# Patient Record
Sex: Female | Born: 1980 | Race: White | Hispanic: No | Marital: Single | Smoking: Former smoker
Health system: Southern US, Community
[De-identification: ages and names within clinical notes are randomized; demographics above are authoritative.]

## PROBLEM LIST (undated history)

## (undated) DIAGNOSIS — R51 Headache: Secondary | ICD-10-CM

## (undated) DIAGNOSIS — F419 Anxiety disorder, unspecified: Secondary | ICD-10-CM

## (undated) DIAGNOSIS — D696 Thrombocytopenia, unspecified: Secondary | ICD-10-CM

## (undated) DIAGNOSIS — R519 Headache, unspecified: Secondary | ICD-10-CM

## (undated) DIAGNOSIS — Z9189 Other specified personal risk factors, not elsewhere classified: Secondary | ICD-10-CM

## (undated) DIAGNOSIS — D689 Coagulation defect, unspecified: Secondary | ICD-10-CM

## (undated) DIAGNOSIS — N809 Endometriosis, unspecified: Secondary | ICD-10-CM

## (undated) DIAGNOSIS — F32A Depression, unspecified: Secondary | ICD-10-CM

## (undated) DIAGNOSIS — G709 Myoneural disorder, unspecified: Secondary | ICD-10-CM

## (undated) DIAGNOSIS — T7840XA Allergy, unspecified, initial encounter: Secondary | ICD-10-CM

## (undated) DIAGNOSIS — F4310 Post-traumatic stress disorder, unspecified: Secondary | ICD-10-CM

## (undated) DIAGNOSIS — E559 Vitamin D deficiency, unspecified: Secondary | ICD-10-CM

## (undated) DIAGNOSIS — N83209 Unspecified ovarian cyst, unspecified side: Secondary | ICD-10-CM

## (undated) DIAGNOSIS — D649 Anemia, unspecified: Secondary | ICD-10-CM

## (undated) DIAGNOSIS — Z1371 Encounter for nonprocreative screening for genetic disease carrier status: Secondary | ICD-10-CM

## (undated) DIAGNOSIS — Z803 Family history of malignant neoplasm of breast: Principal | ICD-10-CM

## (undated) DIAGNOSIS — M1990 Unspecified osteoarthritis, unspecified site: Secondary | ICD-10-CM

## (undated) HISTORY — DX: Encounter for nonprocreative screening for genetic disease carrier status: Z13.71

## (undated) HISTORY — DX: Vitamin D deficiency, unspecified: E55.9

## (undated) HISTORY — DX: Family history of malignant neoplasm of breast: Z80.3

## (undated) HISTORY — DX: Other specified personal risk factors, not elsewhere classified: Z91.89

## (undated) HISTORY — DX: Myoneural disorder, unspecified: G70.9

## (undated) HISTORY — DX: Depression, unspecified: F32.A

## (undated) HISTORY — PX: ABDOMINAL HYSTERECTOMY: SHX81

## (undated) HISTORY — PX: TONSILLECTOMY: SUR1361

## (undated) HISTORY — DX: Coagulation defect, unspecified: D68.9

## (undated) HISTORY — DX: Unspecified osteoarthritis, unspecified site: M19.90

## (undated) HISTORY — DX: Endometriosis, unspecified: N80.9

## (undated) HISTORY — PX: TUBAL LIGATION: SHX77

## (undated) HISTORY — PX: MOUTH SURGERY: SHX715

## (undated) HISTORY — DX: Allergy, unspecified, initial encounter: T78.40XA

---

## 2004-12-11 ENCOUNTER — Emergency Department: Payer: Self-pay | Admitting: Emergency Medicine

## 2005-05-08 ENCOUNTER — Ambulatory Visit: Payer: Self-pay | Admitting: Family Medicine

## 2005-05-08 IMAGING — US ULTRASOUND LEFT BREAST
1 series · 16 of 16 positions shown · non-contrast
Comparison: none

REASON FOR EXAM: lt outer breast lump
COMMENTS:

[Series 1: ultrasound left breast · 16 of 16 slices shown]
[im 1/16]
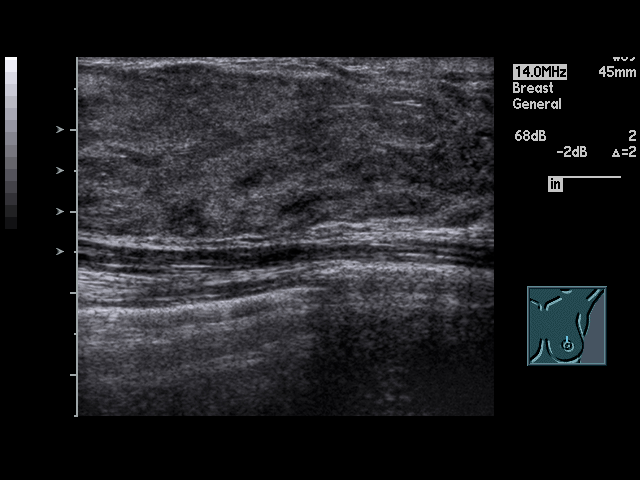
[im 2/16]
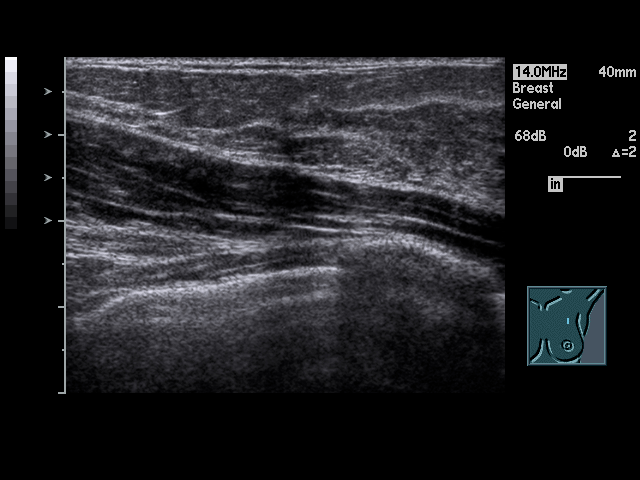
[im 3/16]
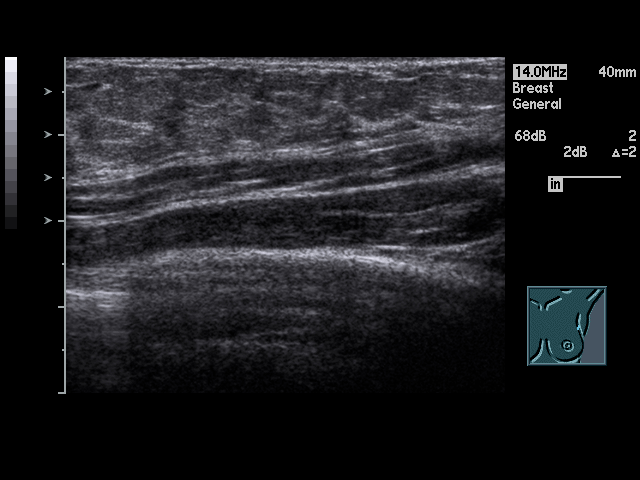
[im 4/16]
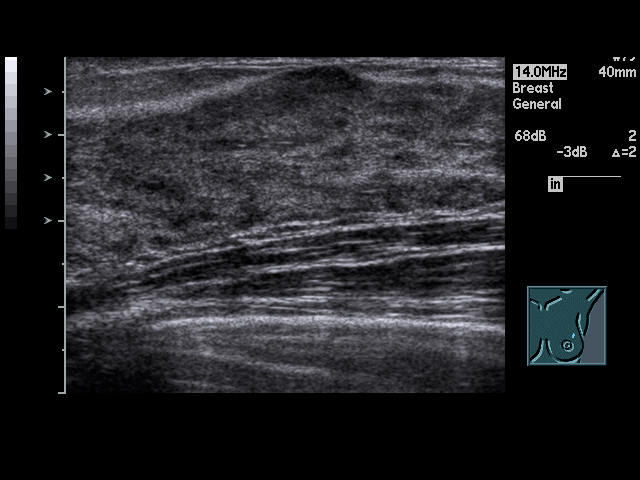
[im 5/16]
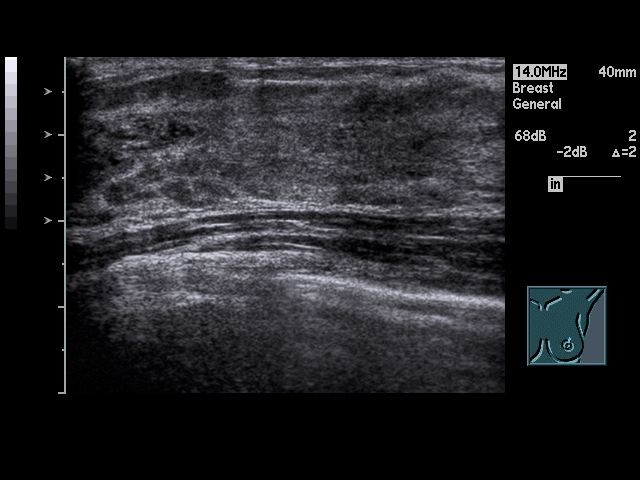
[im 6/16]
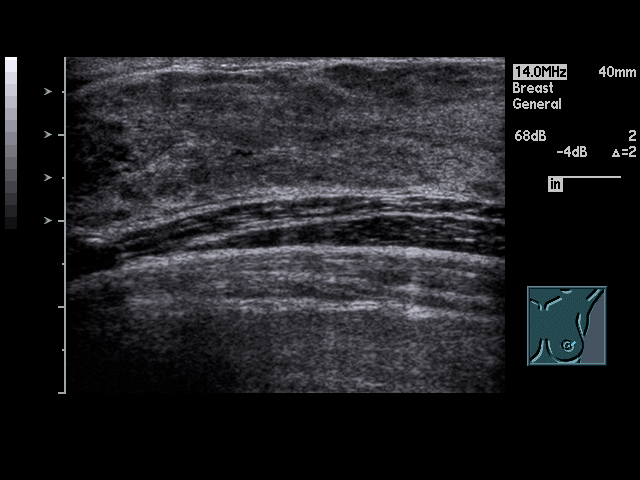
[im 7/16]
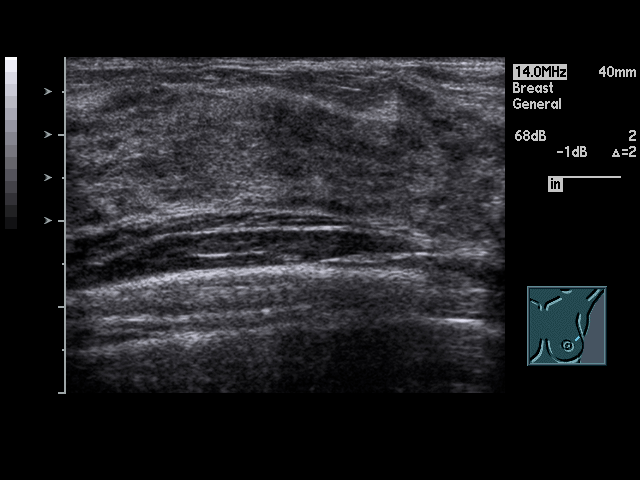
[im 8/16]
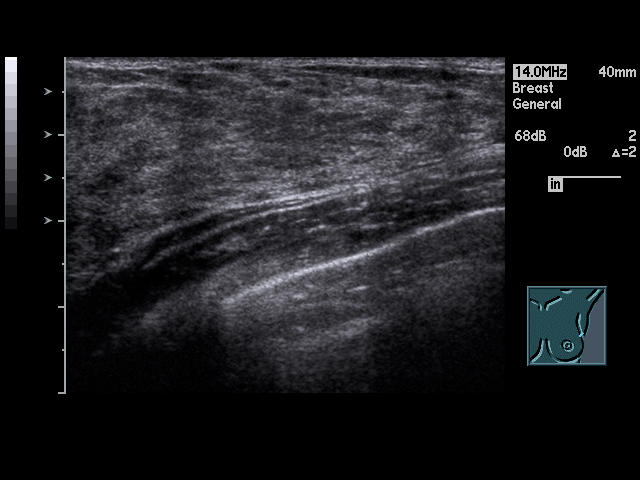
[im 9/16]
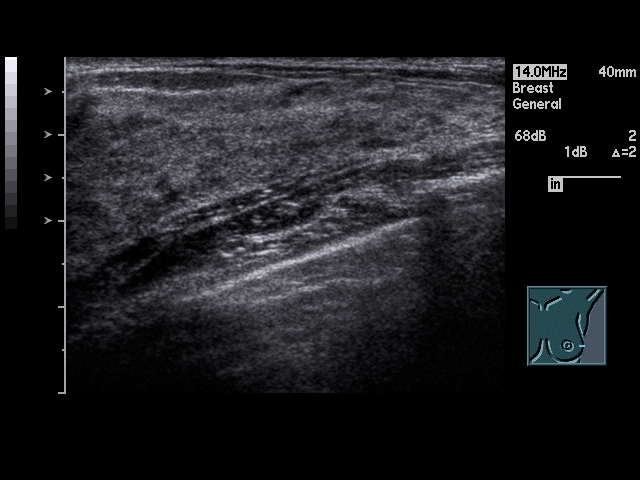
[im 10/16]
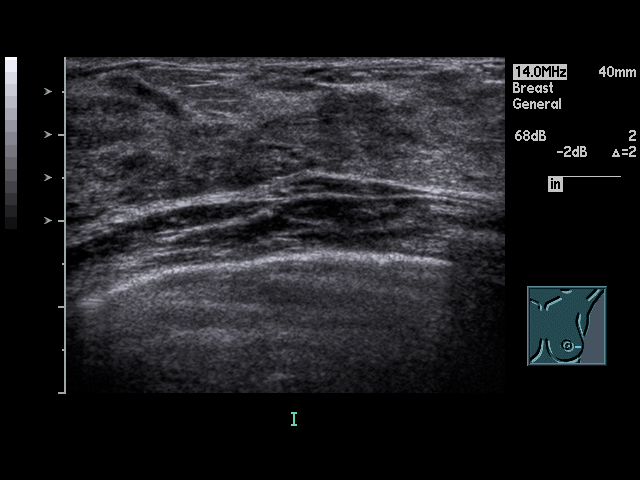
[im 11/16]
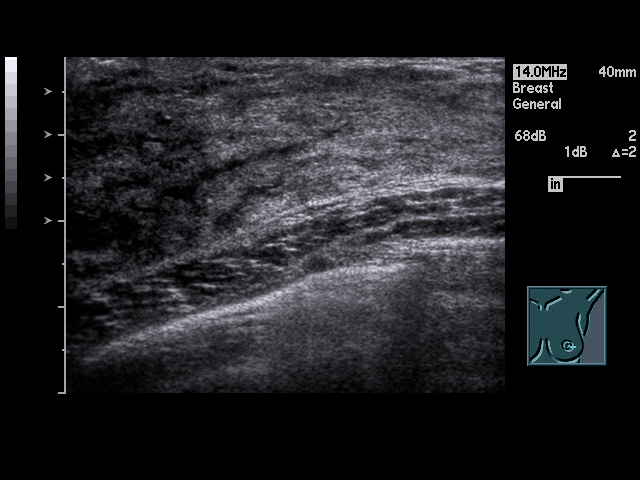
[im 12/16]
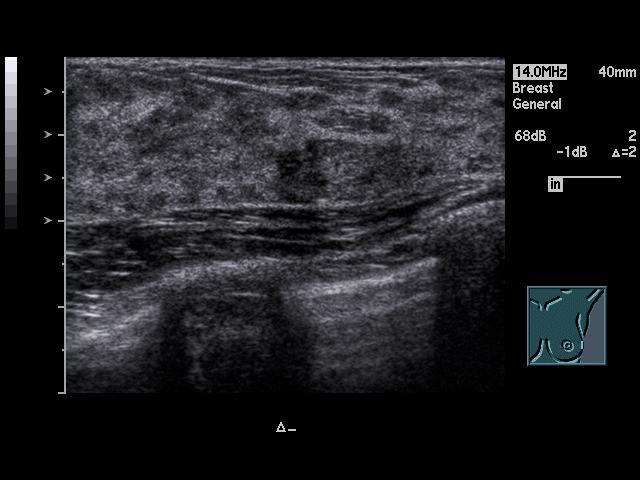
[im 13/16]
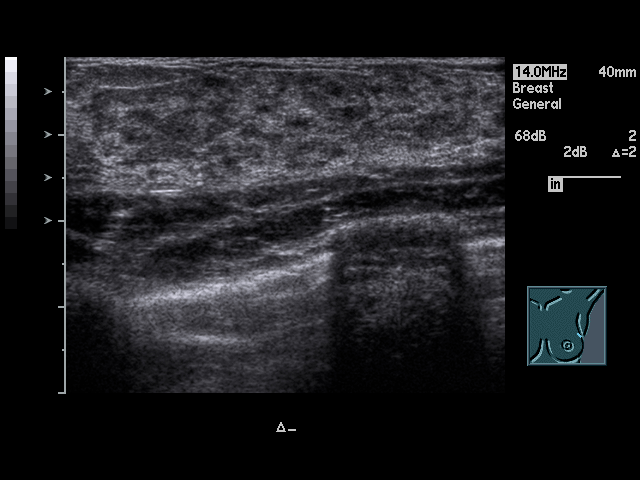
[im 14/16]
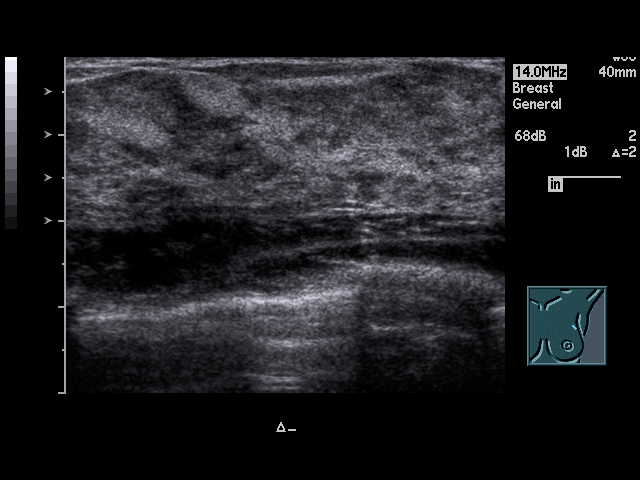
[im 15/16]
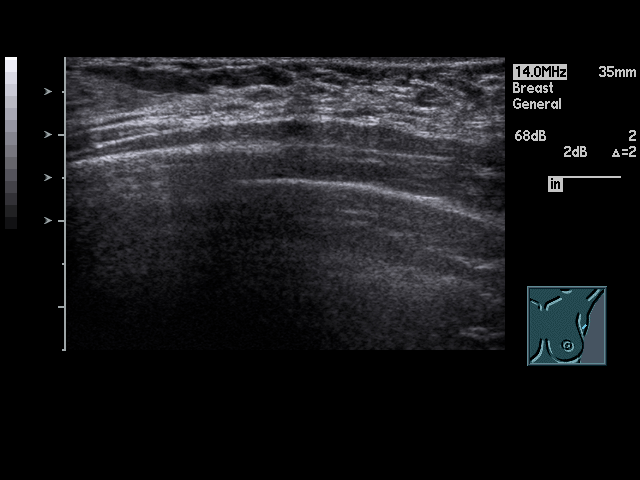
[im 16/16]
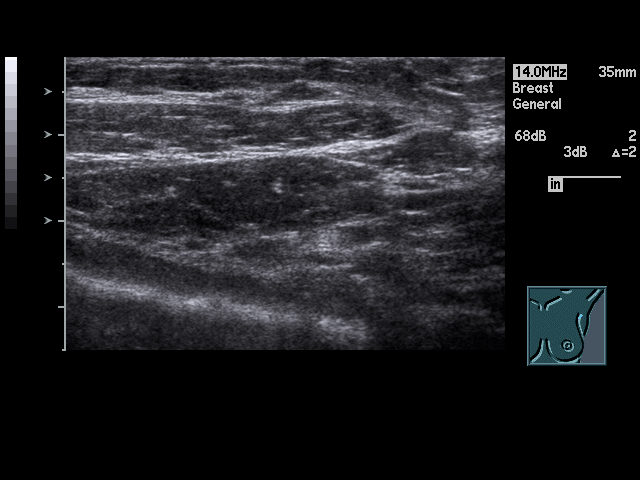

[16 of 16 positions shown; findings below may reference images not displayed]

PROCEDURE:     US  - US BREAST LEFT  - [DATE]  [DATE]

RESULT:       The patient has a palpable area of concern in the upper outer
quadrant of the LEFT breast.  Sonographic examination targeted to this area
shows no specific sonographic abnormalities.  No solid or cystic mass is
seen.  There is observed dense glandular tissue as is seen in the remainder
of the breast.  The etiology for the palpable area is not evident
sonographically.  Note is made that a mammogram has also been requested in
this patient.   Since the patient is at the end of the menstrual cycle, this
exam will be scheduled in approximately two weeks.  If the palpable area of
concern regresses, mammography may not be needed and the patient has been
instructed to check with her doctor's office if regression does occur.
IMPRESSION: No significant sonographic abnormalities are identified.

## 2006-03-01 ENCOUNTER — Emergency Department: Payer: Self-pay | Admitting: Emergency Medicine

## 2006-08-28 ENCOUNTER — Ambulatory Visit: Payer: Self-pay

## 2006-08-28 IMAGING — US US PELV - US TRANSVAGINAL
1 series · 17 of 25 positions shown · non-contrast
Comparison: none

REASON FOR EXAM: Pain on left side
               CALL REPORT: [PHONE_NUMBER]
COMMENTS:

[Series 1: us pelv - us transvaginal · 17 of 80 slices shown]
[im 1/80]
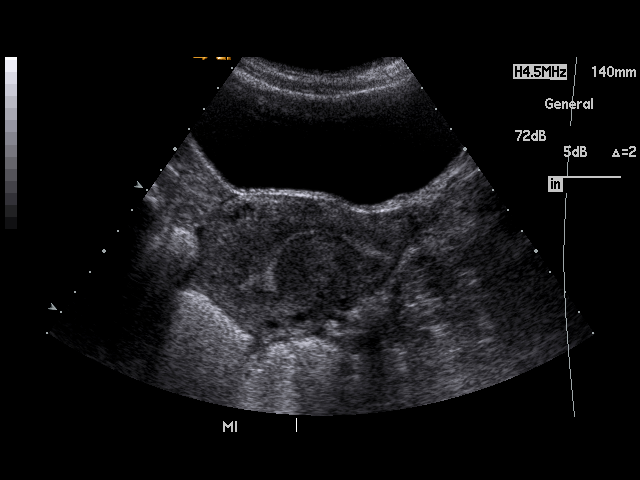
[im 7/80]
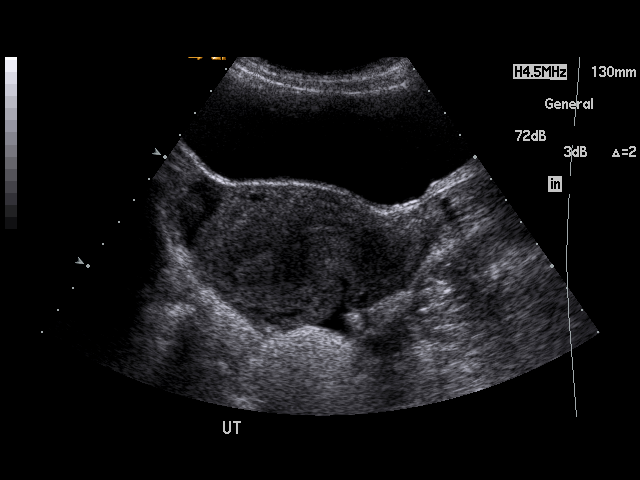
[im 10/80]
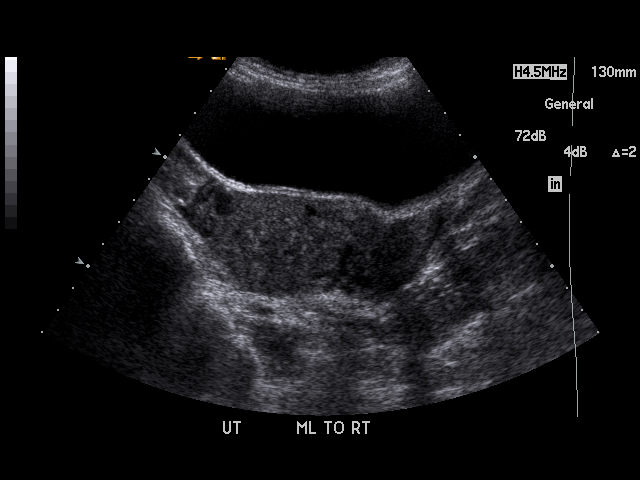
[im 17/80]
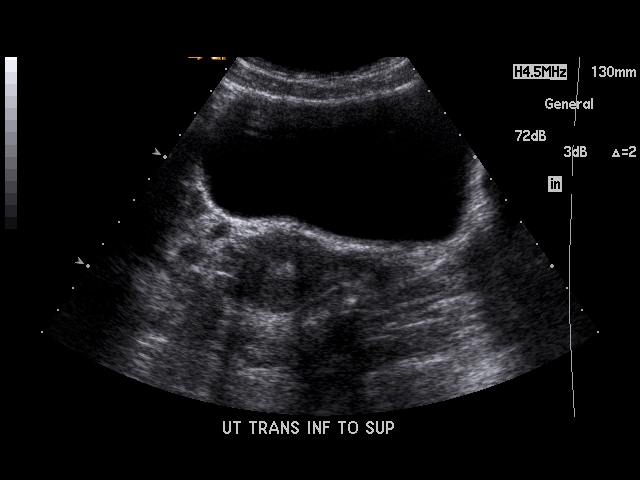
[im 20/80]
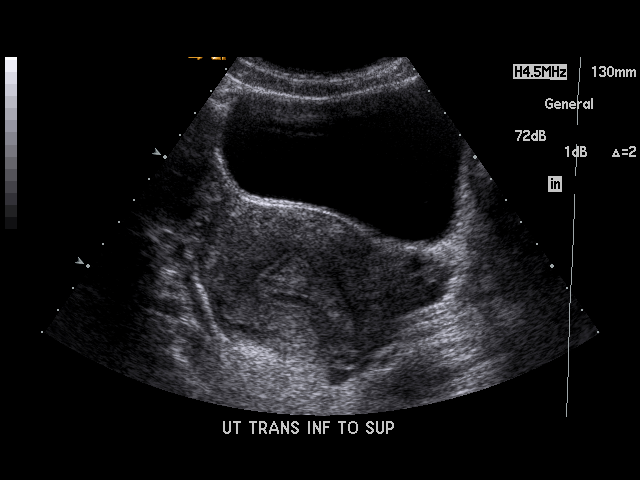
[im 27/80]
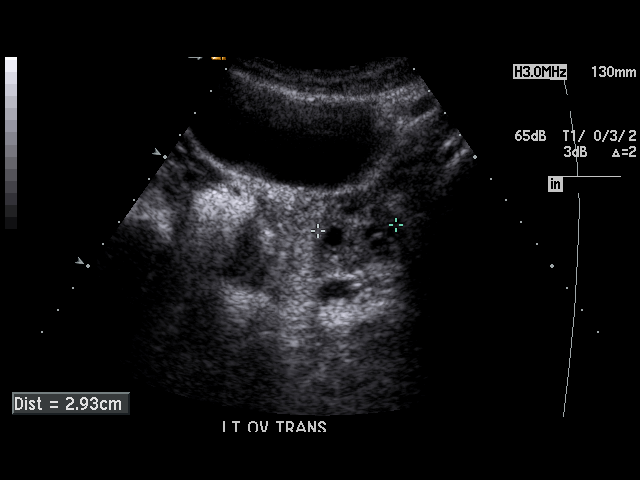
[im 30/80]
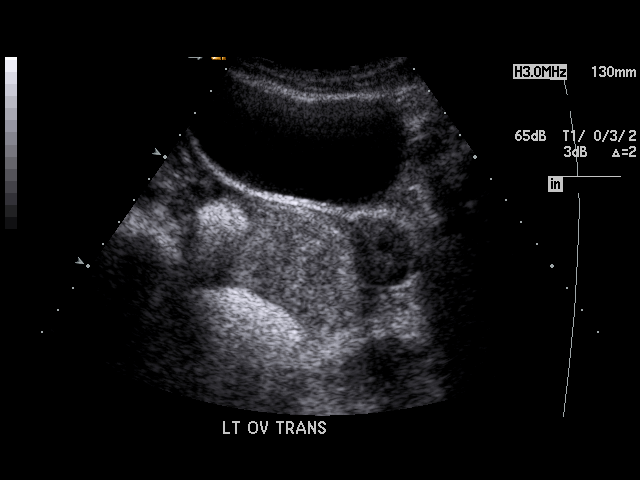
[im 37/80]
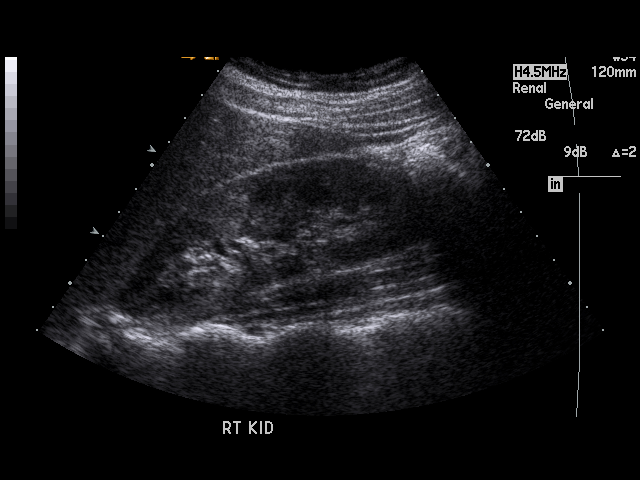
[im 40/80]
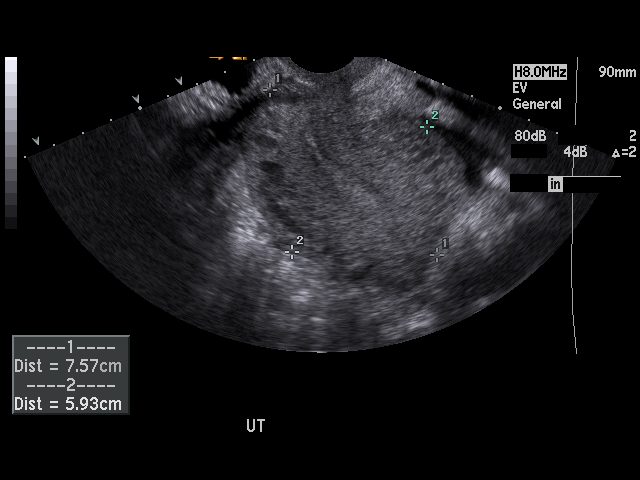
[im 43/80]
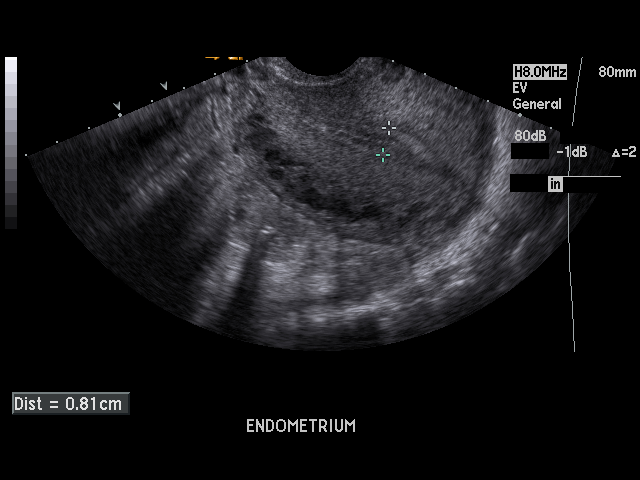
[im 50/80]
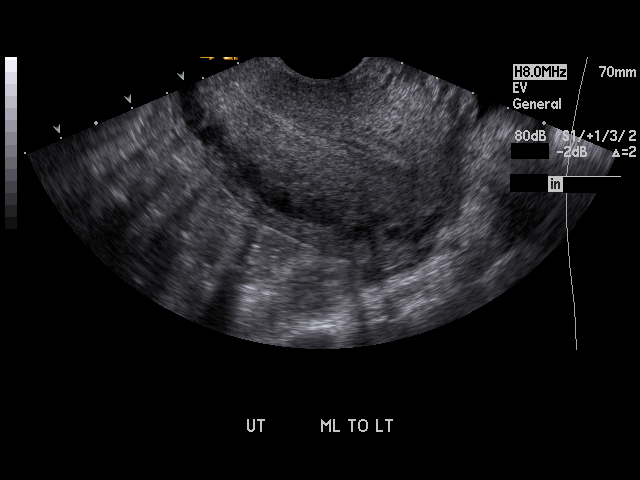
[im 53/80]
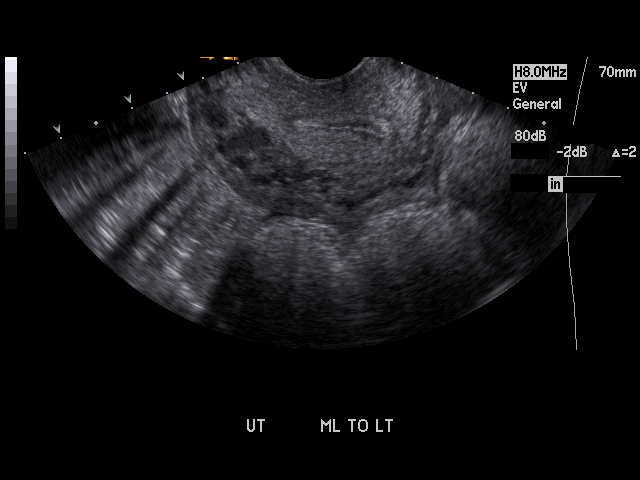
[im 60/80]
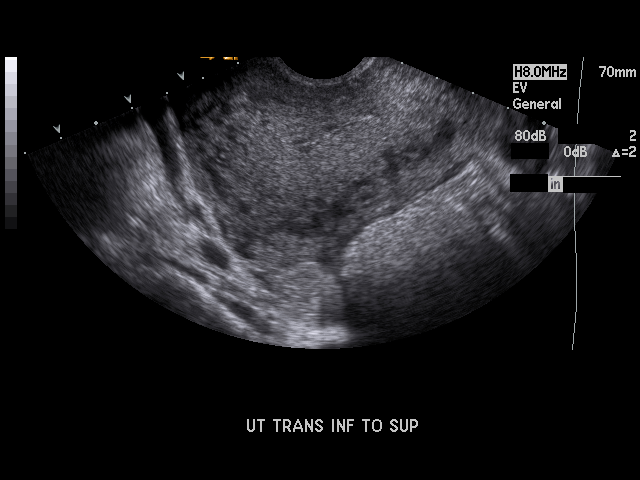
[im 63/80]
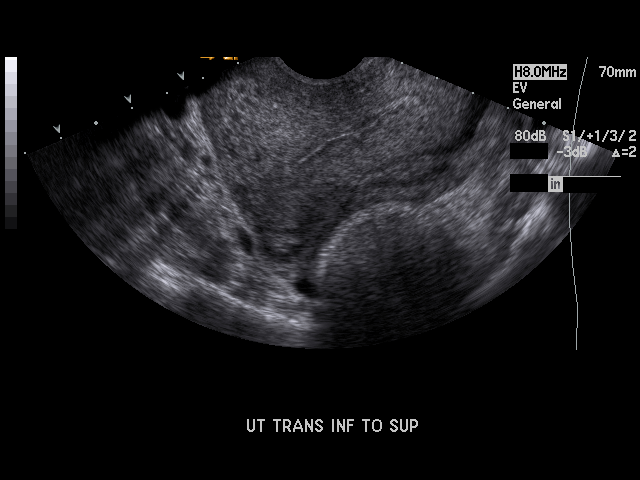
[im 70/80]
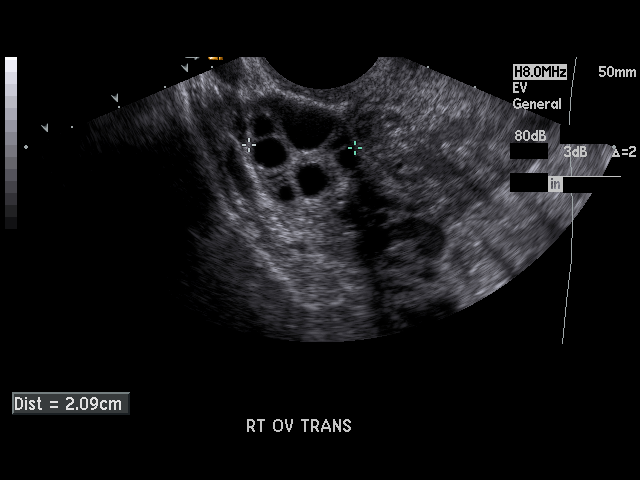
[im 73/80]
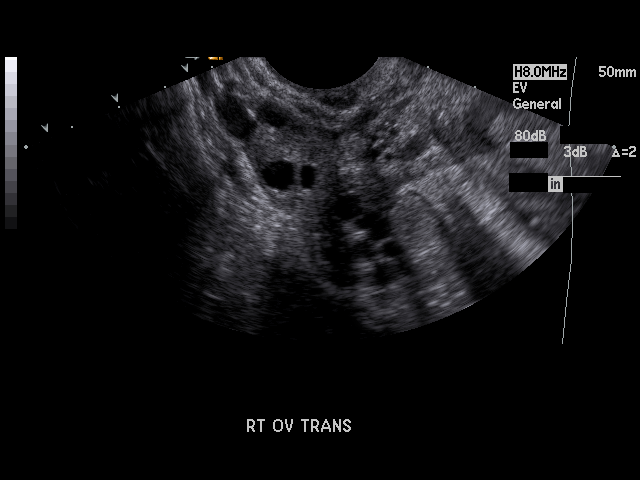
[im 80/80]
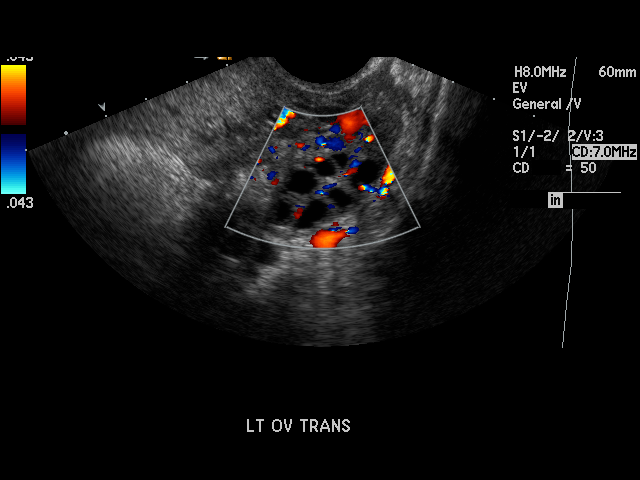

[17 of 25 positions shown; findings below may reference images not displayed]

PROCEDURE:     US  - US PELVIS MASS EXAM  - [DATE]  [DATE] [DATE]  [DATE]

RESULT:     Transabdominal and endovaginal ultrasound was performed. The
uterus is retroverted. The uterus measures 7.57 cm x 5.93 cm x 7.08 cm. The
endometrium measures 4.9 mm in thickness. There is a trace of fluid in the
endometrial cavity. The significance of this is uncertain. No uterine mass
lesions are identified. The RIGHT and LEFT ovaries are visualized. The RIGHT
ovary measures 3.9 cm at maximum diameter. The LEFT ovary measures 2.9 cm at
maximum diameter. There are noted multiple follicular cysts in each ovary.
No abnormal adnexal masses are seen. There is no free fluid in the pelvis.
The visualized portion of the urinary bladder shows no significant
abnormalities. The RIGHT and LEFT kidneys were visualized and show no
hydronephrosis.
IMPRESSION: 1.     No specific abnormalities are identified.
2.     The uterus is retroverted.
3.     There is a trace of fluid in the endometrial canal.
4.     No abnormal adnexal masses are identified.

## 2007-07-29 ENCOUNTER — Ambulatory Visit: Payer: Self-pay

## 2007-12-03 ENCOUNTER — Emergency Department: Payer: Self-pay | Admitting: Emergency Medicine

## 2007-12-03 IMAGING — CR DG CHEST 2V
1 series · 2 of 2 positions shown · non-contrast
Comparison: none

REASON FOR EXAM: rib pain
COMMENTS:

PROCEDURE:     DXR - DXR CHEST PA (OR AP) AND LATERAL  - [DATE] [DATE]
RESULT:     The lung fields are clear. The heart, mediastinal and osseous
structures show no significant abnormalities.

[Series 1: view not recorded · 0.17mm/px · 2 of 2 slices shown]
[im 1/2]
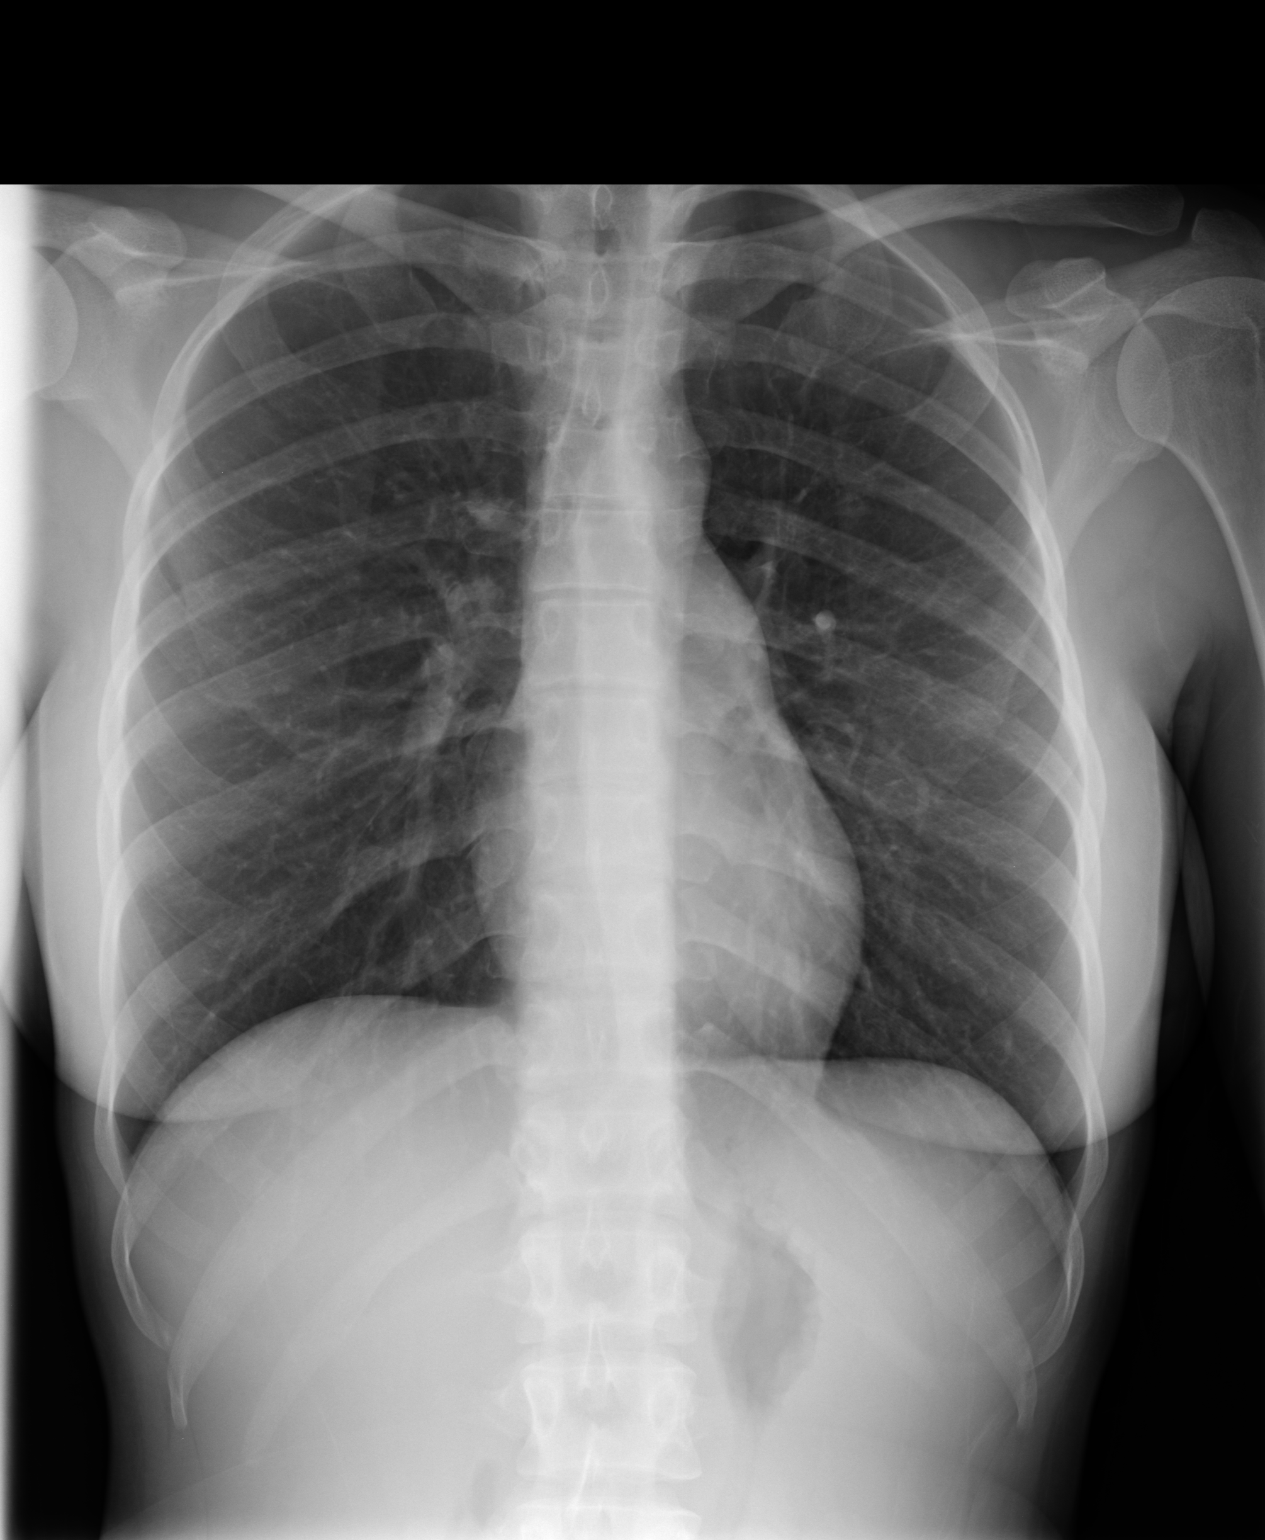
[im 2/2]
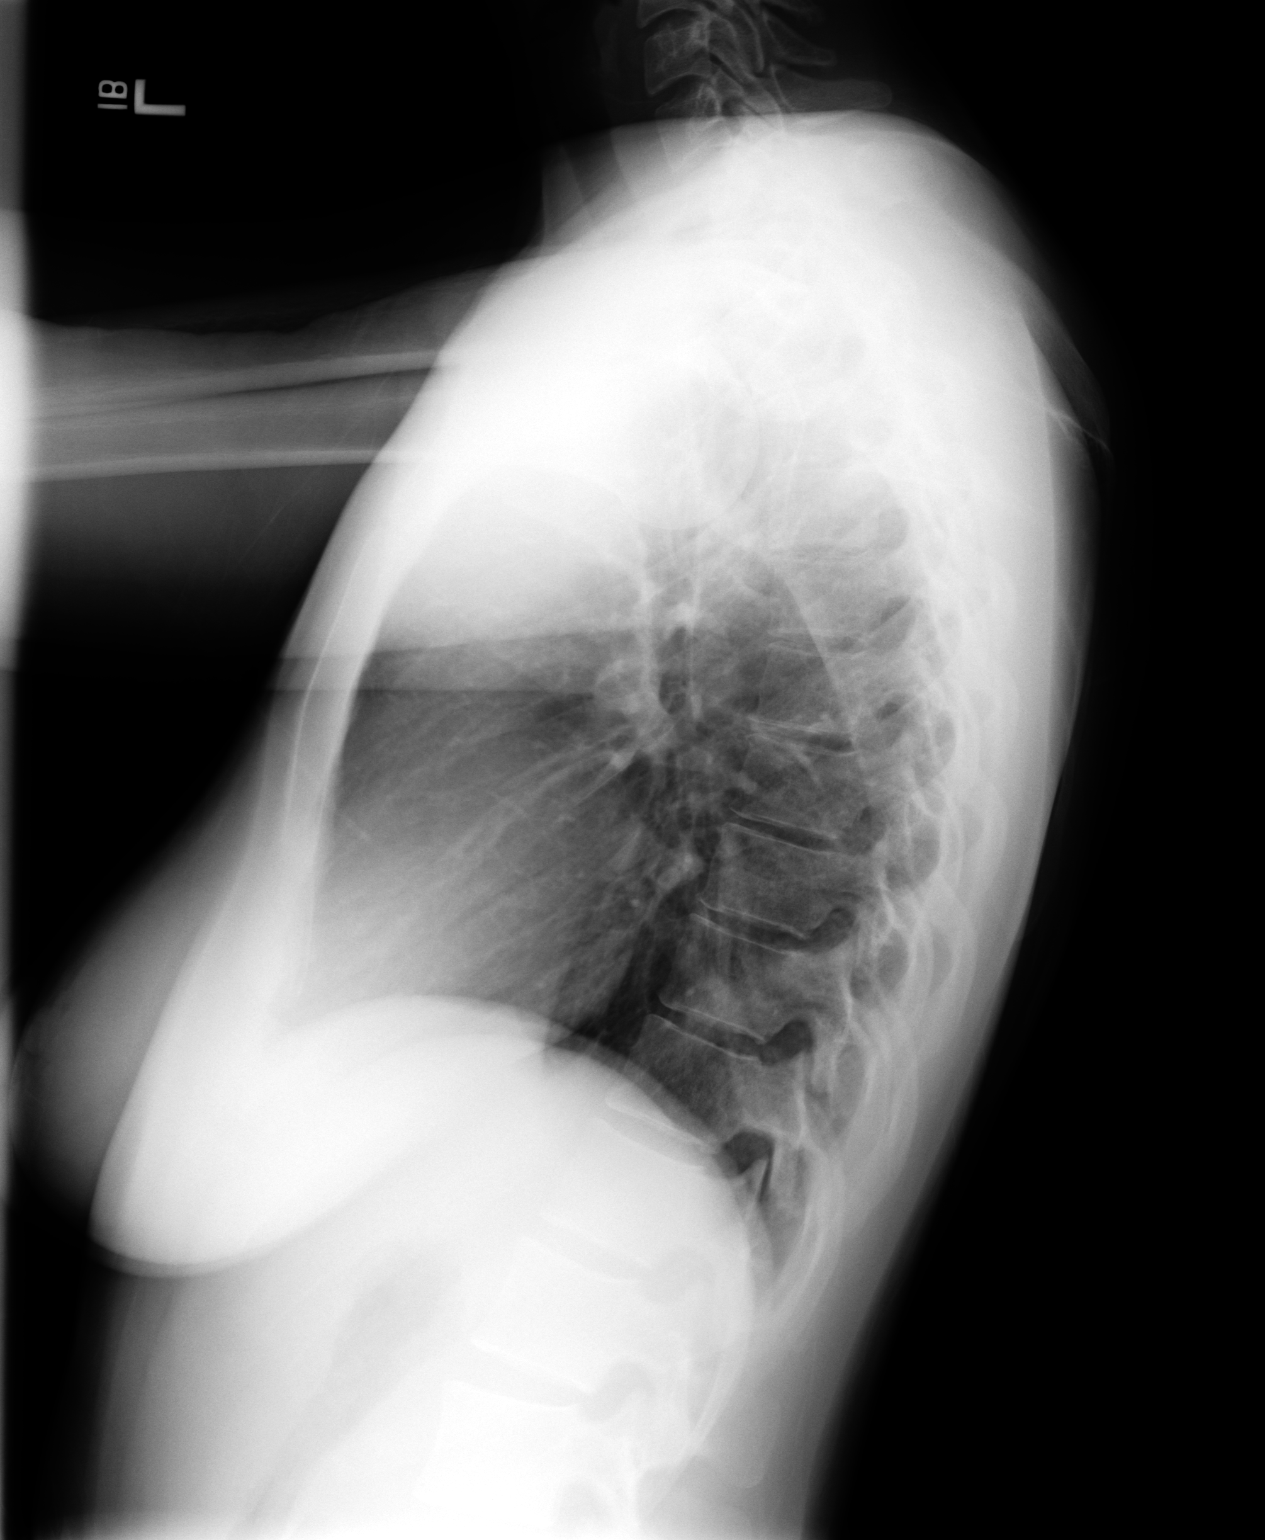

[2 of 2 positions shown; findings below may reference images not displayed]

IMPRESSION: 1.     No significant abnormalities are noted.

## 2008-06-01 ENCOUNTER — Emergency Department: Payer: Self-pay | Admitting: Emergency Medicine

## 2008-06-10 ENCOUNTER — Emergency Department: Payer: Self-pay | Admitting: Emergency Medicine

## 2009-01-20 ENCOUNTER — Emergency Department: Payer: Self-pay | Admitting: Emergency Medicine

## 2011-01-14 ENCOUNTER — Emergency Department: Payer: Self-pay | Admitting: Unknown Physician Specialty

## 2011-01-14 IMAGING — CR DG ELBOW COMPLETE 3+V*L*
1 series · 5 of 5 positions shown · non-contrast
Comparison: none

REASON FOR EXAM: s/p injury
COMMENTS:   May transport without cardiac monitor

PROCEDURE:     DXR - DXR ELBOW LT COMP W/OBLIQUES  - [DATE]  [DATE]
RESULT:     Images of the left elbow demonstrate minimal lucency in the
proximal radius concerning for nondisplaced radial head fracture. Minimal
hemarthrosis is suggested. The alignment is maintained.

[Series 1: view not recorded · 0.17mm/px · 5 of 5 slices shown]
[im 1/5]
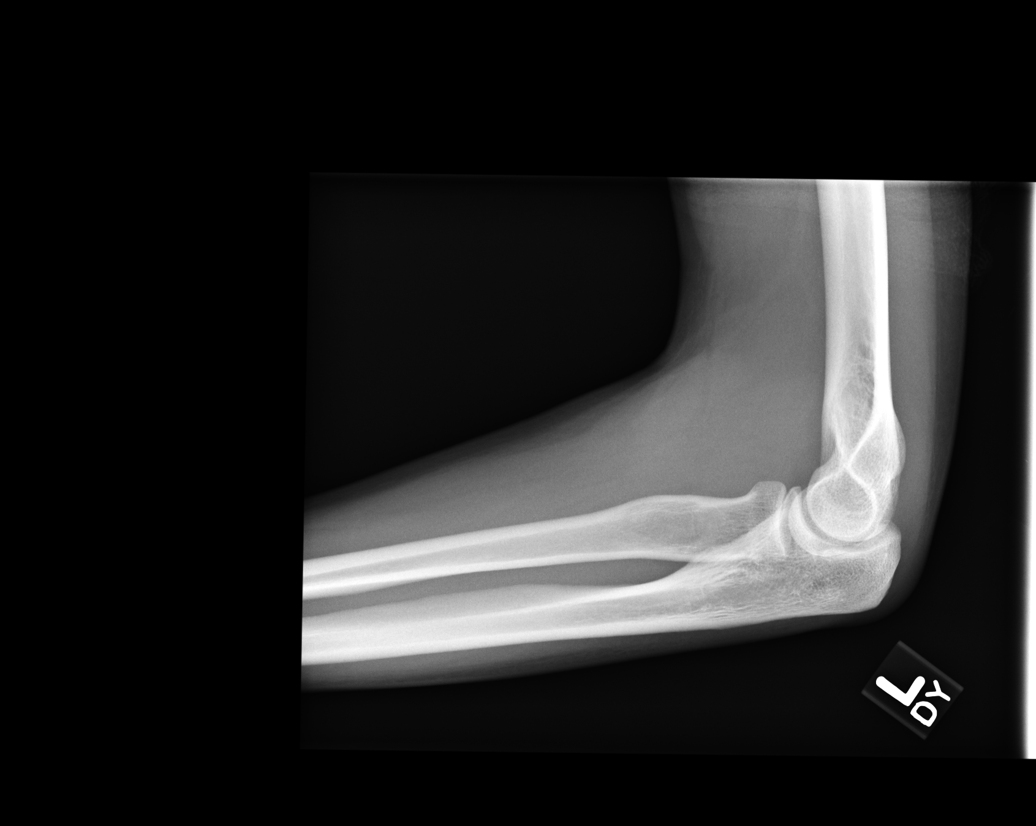
[im 2/5]
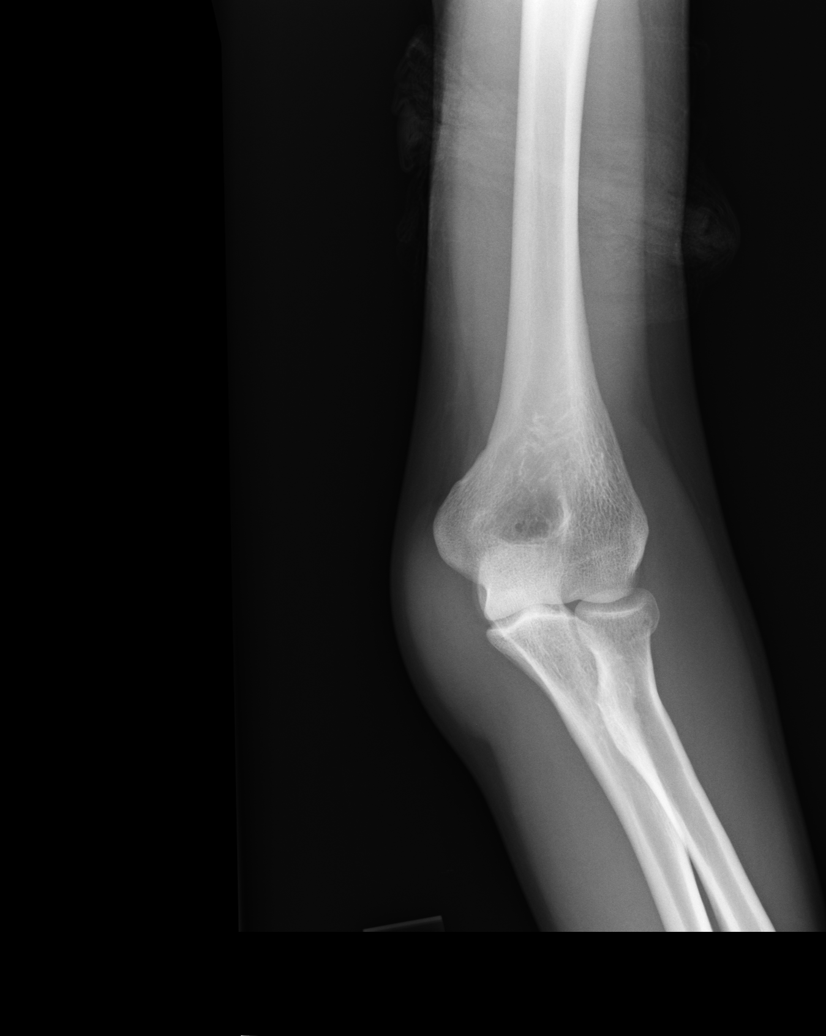
[im 3/5]
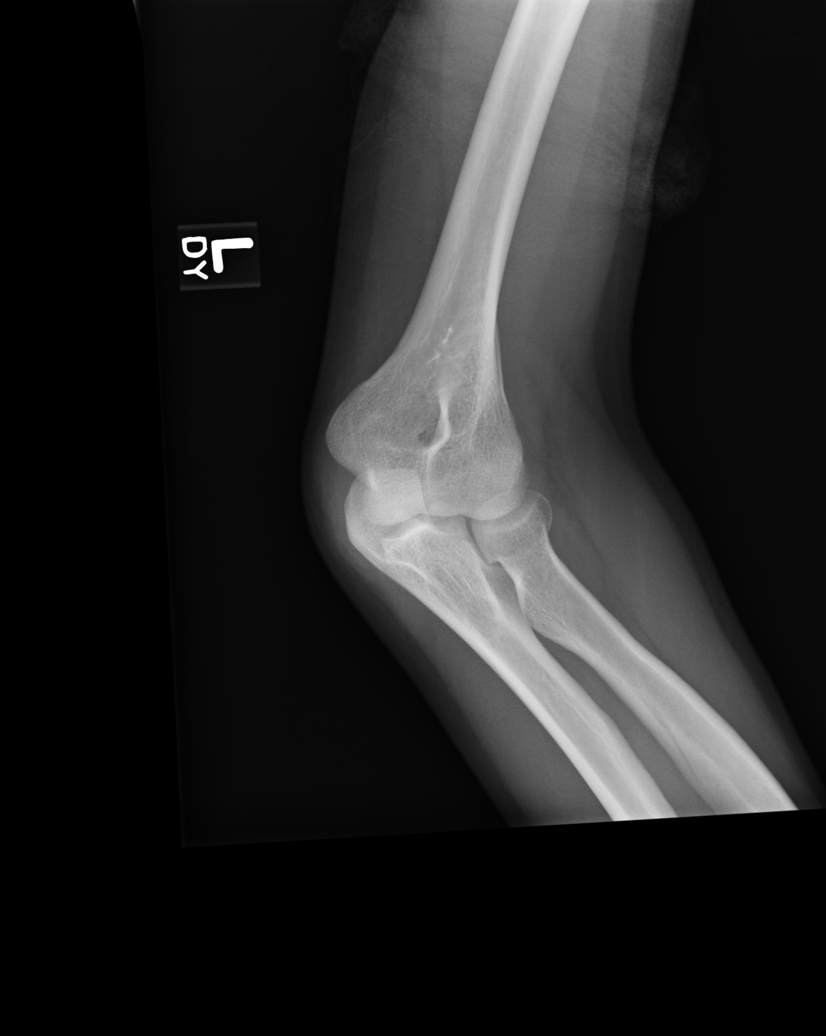
[im 4/5]
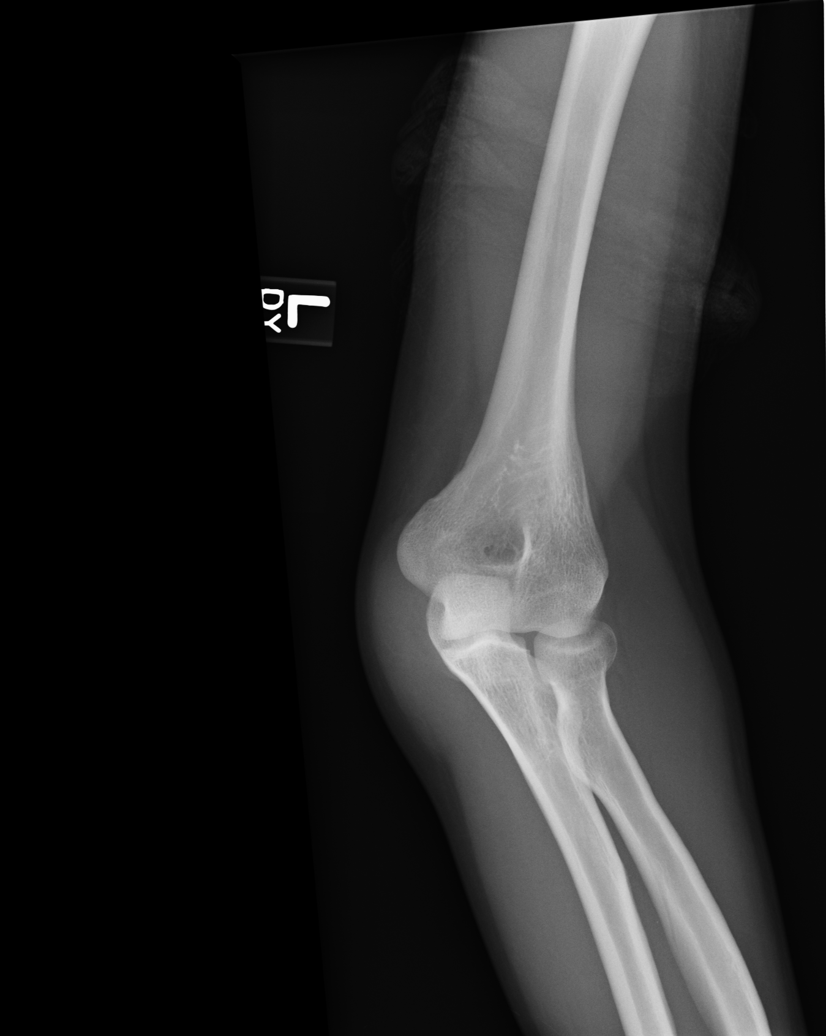
[im 5/5]
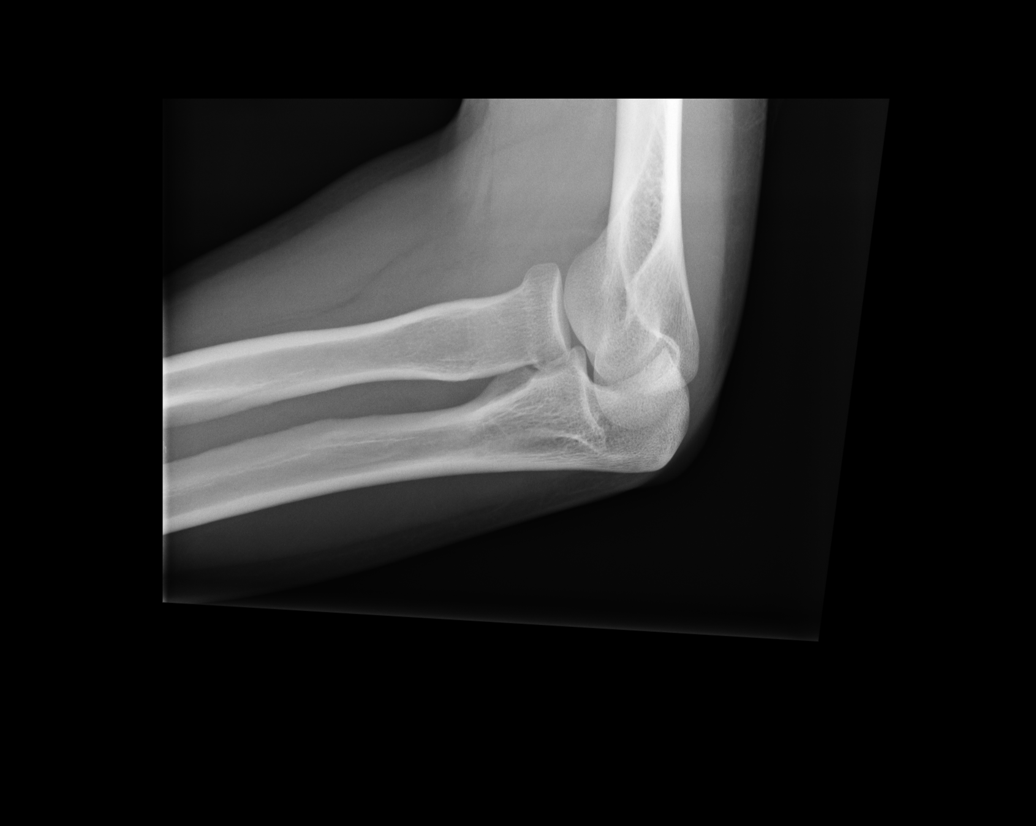

[5 of 5 positions shown; findings below may reference images not displayed]

IMPRESSION: Please see above.

## 2012-03-31 ENCOUNTER — Emergency Department: Payer: Self-pay | Admitting: Emergency Medicine

## 2012-03-31 LAB — COMPREHENSIVE METABOLIC PANEL
Albumin: 4.3 g/dL (ref 3.4–5.0)
BUN: 13 mg/dL (ref 7–18)
Bilirubin,Total: 0.7 mg/dL (ref 0.2–1.0)
Calcium, Total: 8.8 mg/dL (ref 8.5–10.1)
SGPT (ALT): 24 U/L
Total Protein: 7.5 g/dL (ref 6.4–8.2)

## 2012-03-31 LAB — URINALYSIS, COMPLETE
Glucose,UR: NEGATIVE mg/dL (ref 0–75)
Ketone: NEGATIVE
Leukocyte Esterase: NEGATIVE
Ph: 5 (ref 4.5–8.0)
Protein: NEGATIVE
RBC,UR: 1 /HPF (ref 0–5)
Specific Gravity: 1.023 (ref 1.003–1.030)
Squamous Epithelial: 2

## 2012-03-31 LAB — PREGNANCY, URINE: Pregnancy Test, Urine: NEGATIVE m[IU]/mL

## 2012-03-31 LAB — CBC
MCHC: 32.9 g/dL (ref 32.0–36.0)
MCV: 93 fL (ref 80–100)
RDW: 12.3 % (ref 11.5–14.5)
WBC: 5.5 10*3/uL (ref 3.6–11.0)

## 2012-03-31 IMAGING — US ABDOMEN ULTRASOUND LIMITED
1 series · 16 of 16 positions shown · non-contrast
Comparison: none

REASON FOR EXAM: R/o appendicitis
COMMENTS:   Body Site: Appendix/Bowel

PROCEDURE:     US  - US ABDOMEN LIMITED SURVEY  - [DATE] [DATE]
RESULT:     Right lower quadrant ultrasound exam obtained. Appendix is not
visualized. Small trace amount of free fluid is noted. Tiny lymph node is
noted in the right lower quadrant.

[Series 1: abdomen ultrasound limited · 16 of 16 slices shown]
[im 1/16]
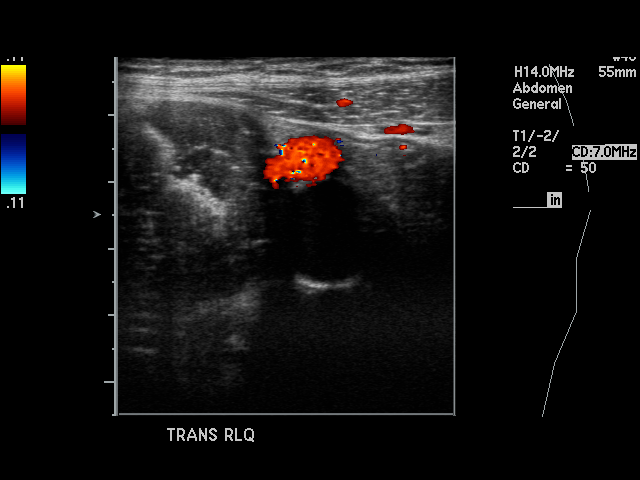
[im 2/16]
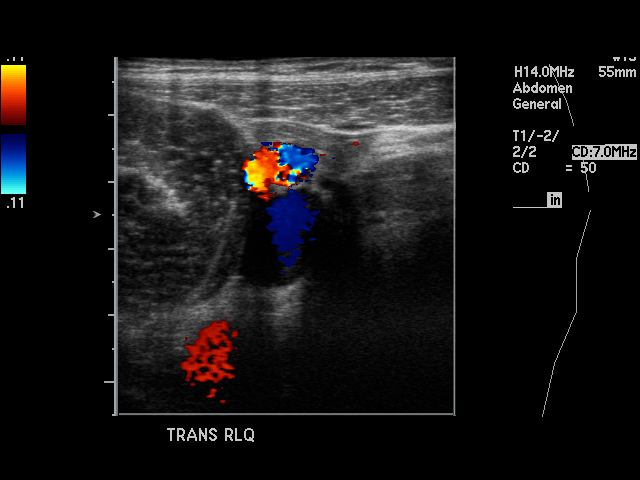
[im 3/16]
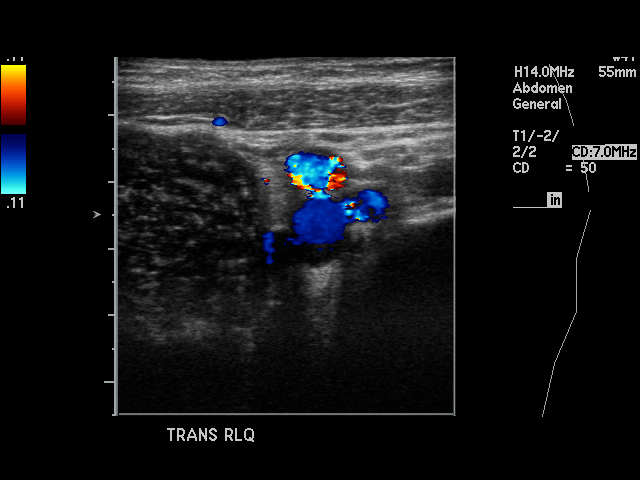
[im 4/16]
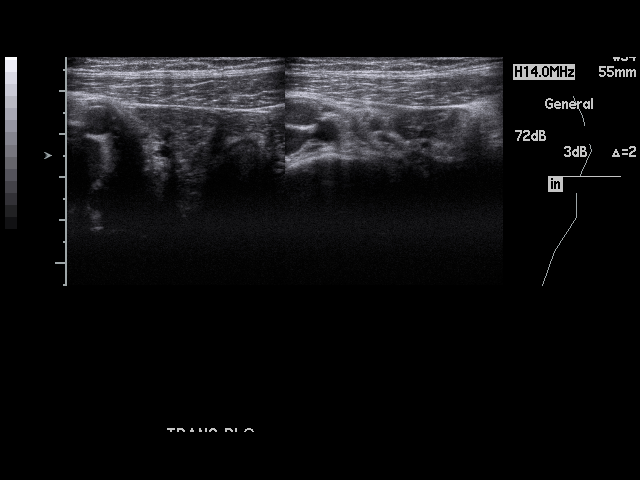
[im 5/16]
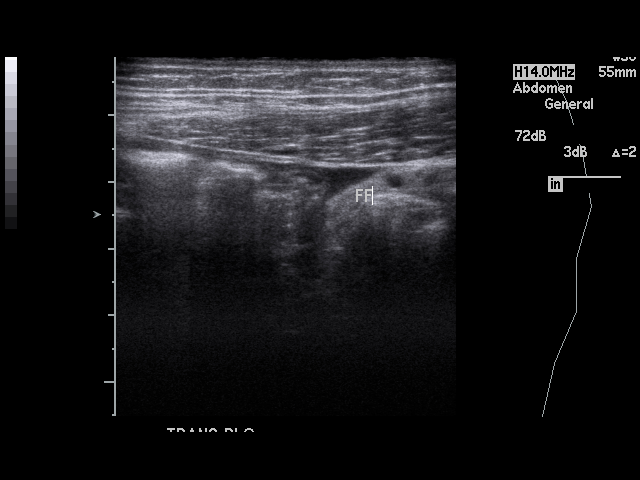
[im 6/16]
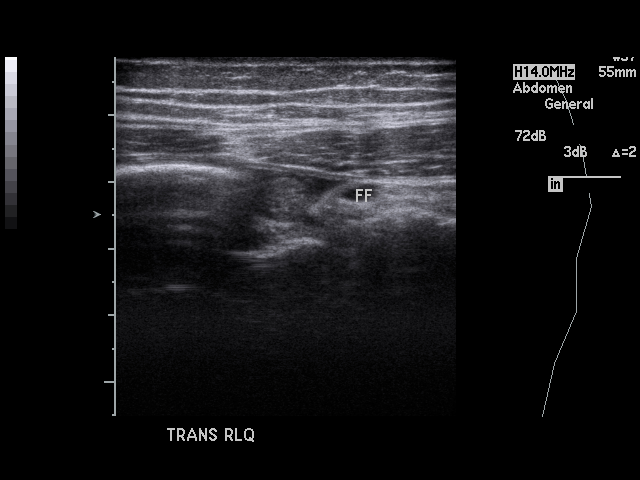
[im 7/16]
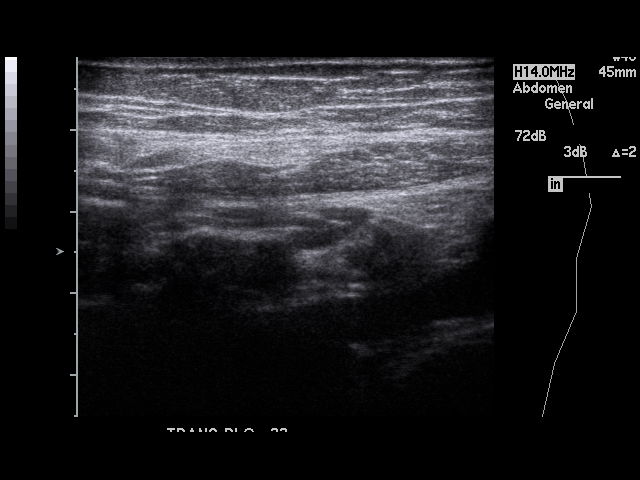
[im 8/16]
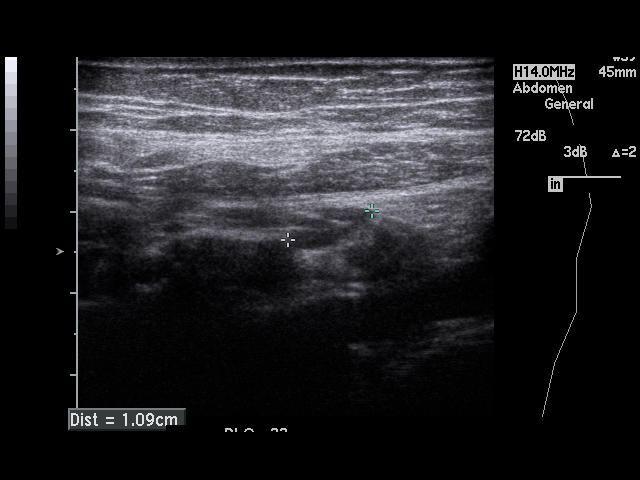
[im 9/16]
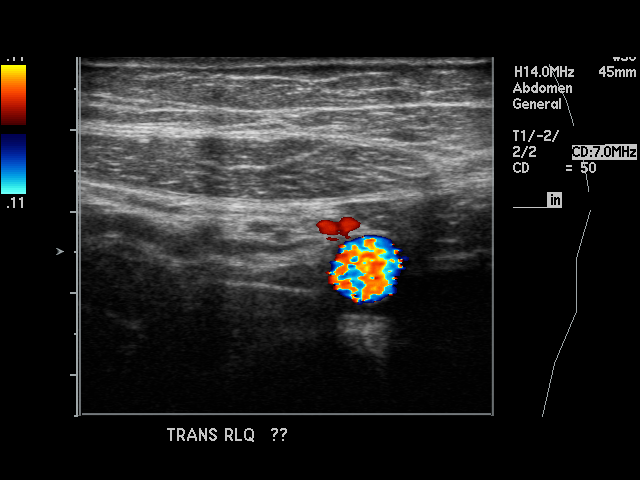
[im 10/16]
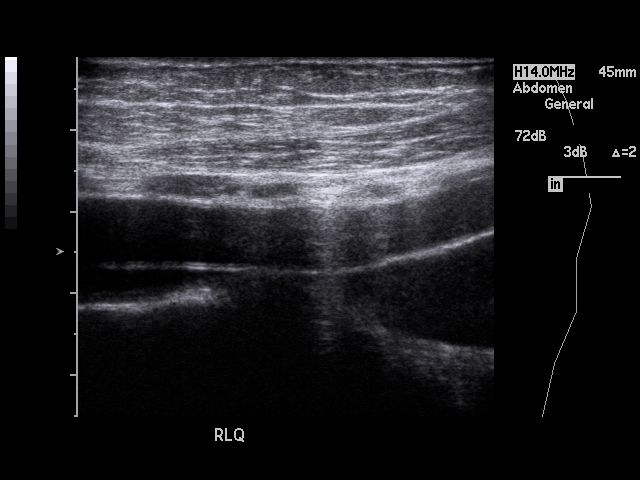
[im 11/16]
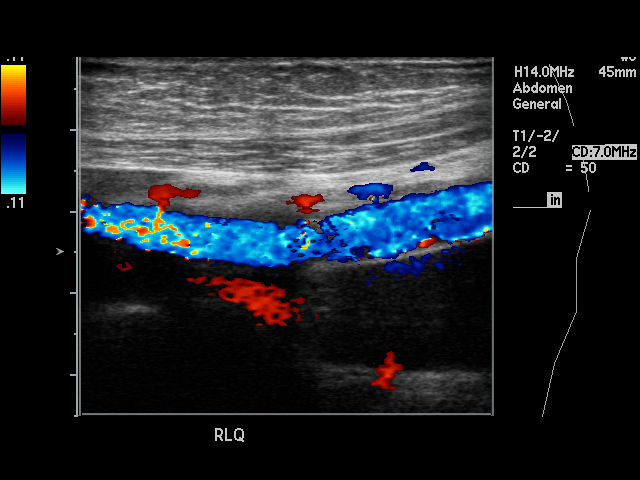
[im 12/16]
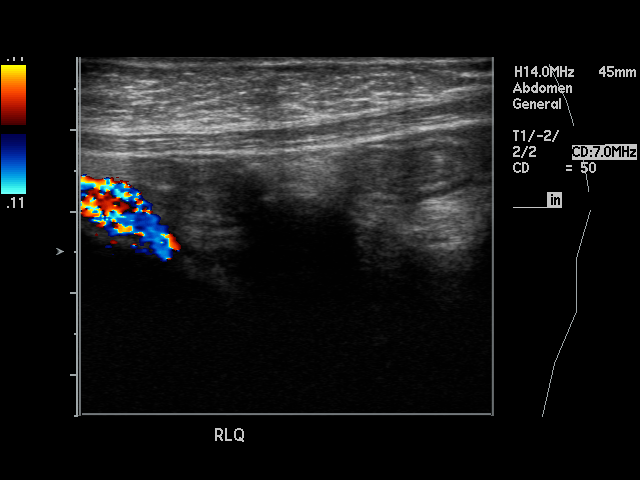
[im 13/16]
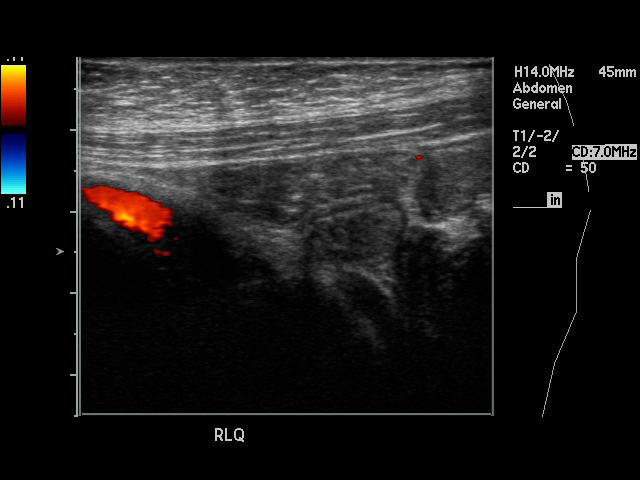
[im 14/16]
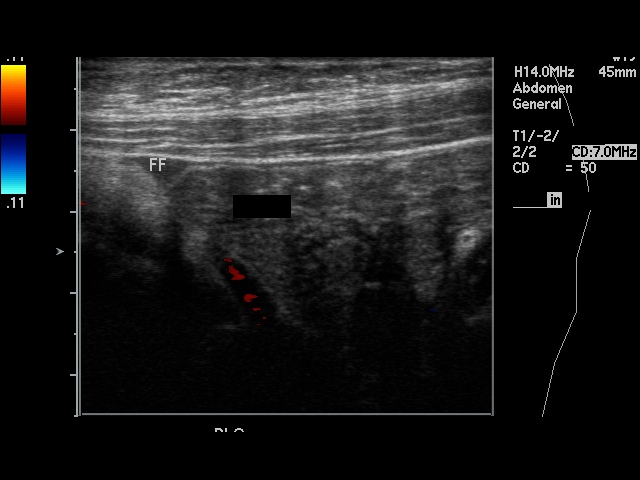
[im 15/16]
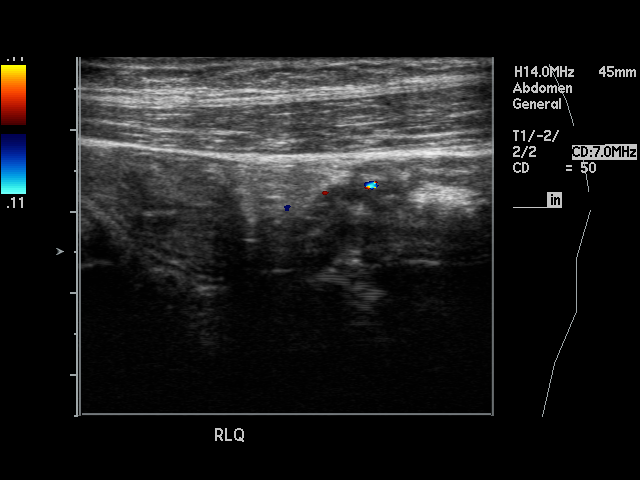
[im 16/16]
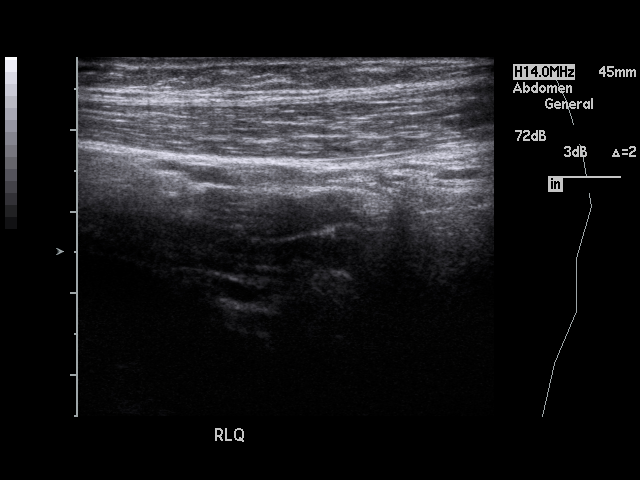

[16 of 16 positions shown; findings below may reference images not displayed]

IMPRESSION: Appendix is not visualized. Other findings as above.

[REDACTED]

## 2012-03-31 IMAGING — US US PELV - US TRANSVAGINAL
1 series · 17 of 25 positions shown · non-contrast
Comparison: none

REASON FOR EXAM: RLQ and rt adnexal pain, pt is extremely thin, please
attempt to see appendix al
COMMENTS:

PROCEDURE:     US  - US PELVIS EXAM W/TRANSVAGINAL  - [DATE]  [DATE]
RESULT:     Retroverted uterus is noted. Endometrial thickness is normal. A
1.8 cm cyst is noted of the right ovary. Bilateral ovarian flow is noted.
There is no hydronephrosis.

[Series 1: us pelv - us transvaginal · 17 of 51 slices shown]
[im 1/51]
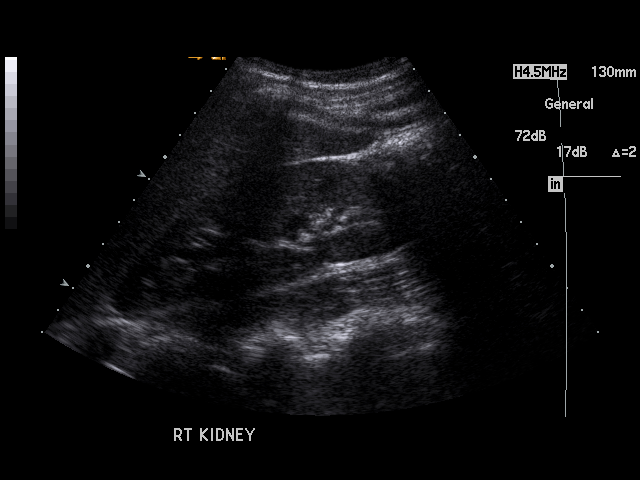
[im 5/51]
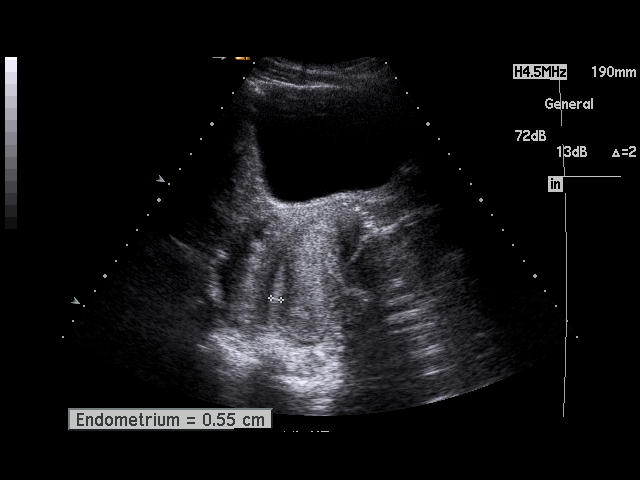
[im 7/51]
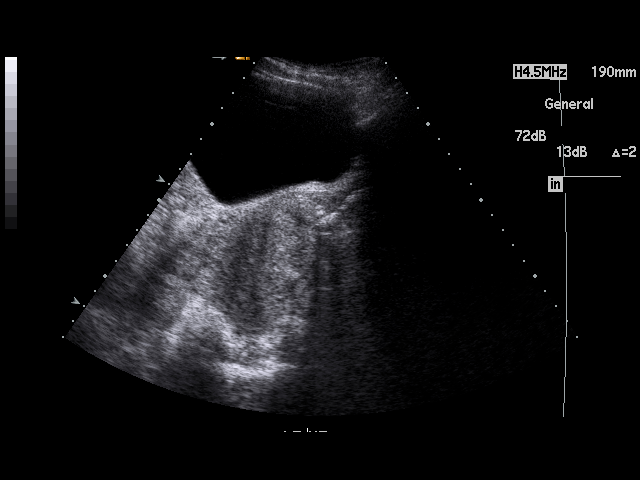
[im 11/51]
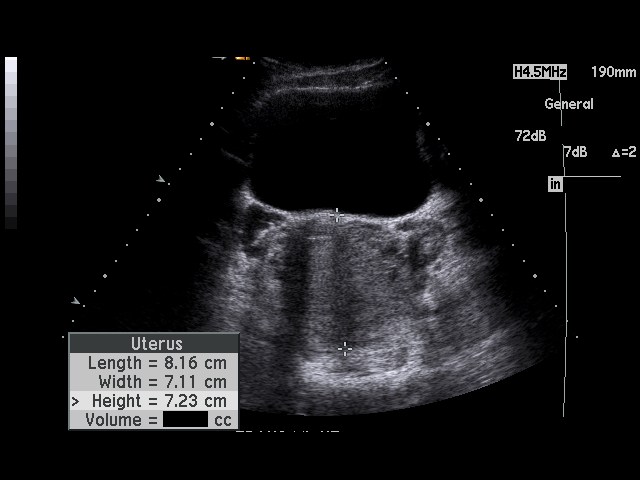
[im 13/51]
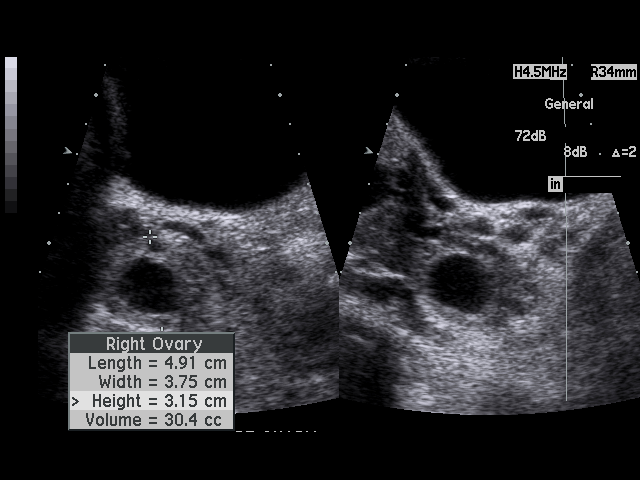
[im 17/51]
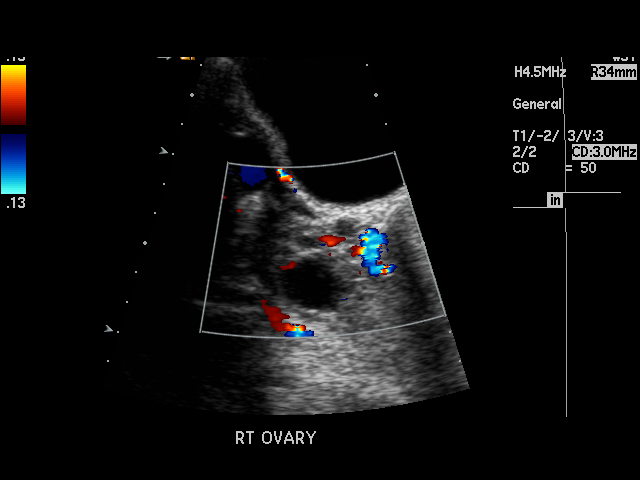
[im 19/51]
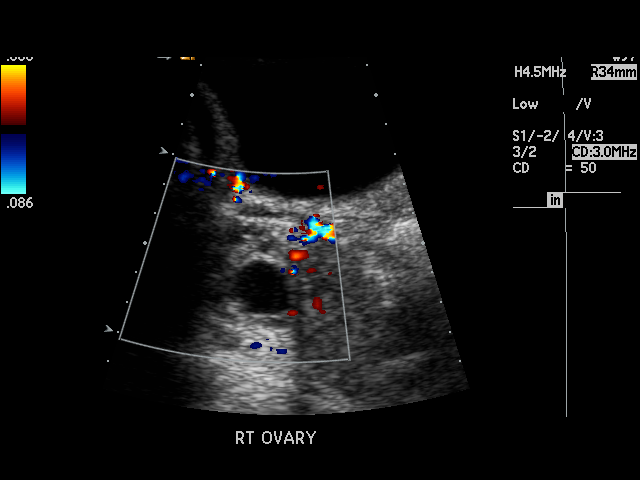
[im 23/51]
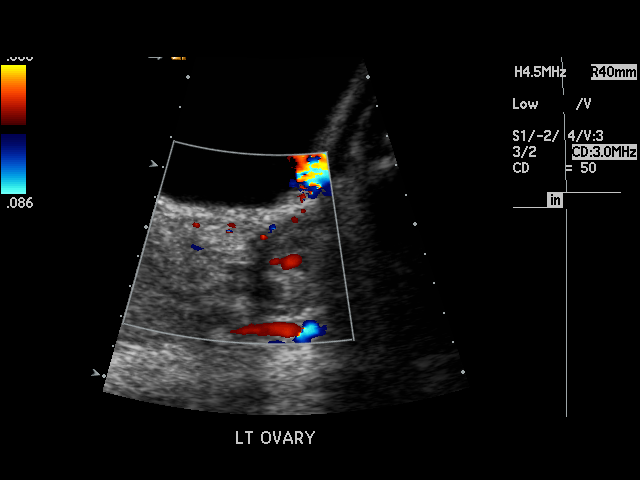
[im 26/51]
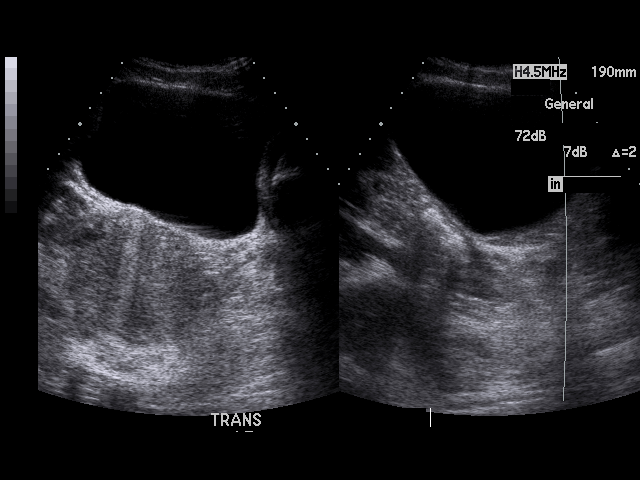
[im 28/51]
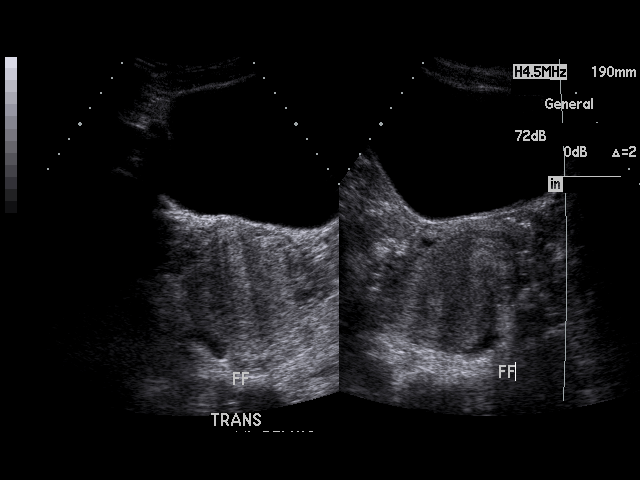
[im 32/51]
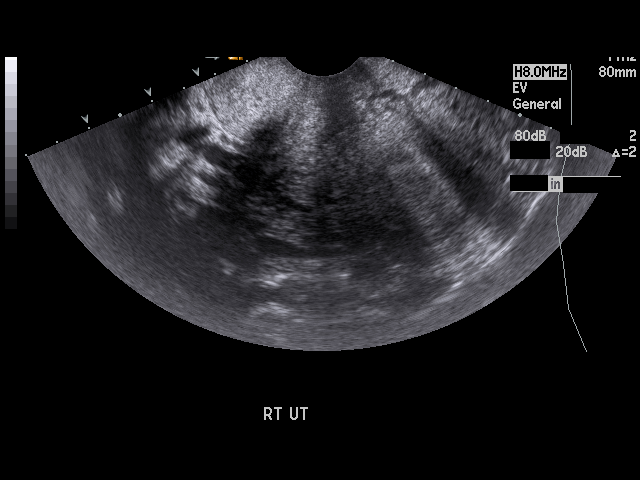
[im 34/51]
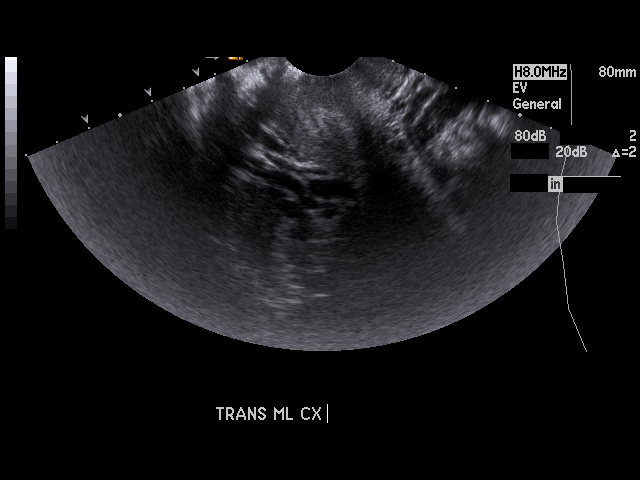
[im 38/51]
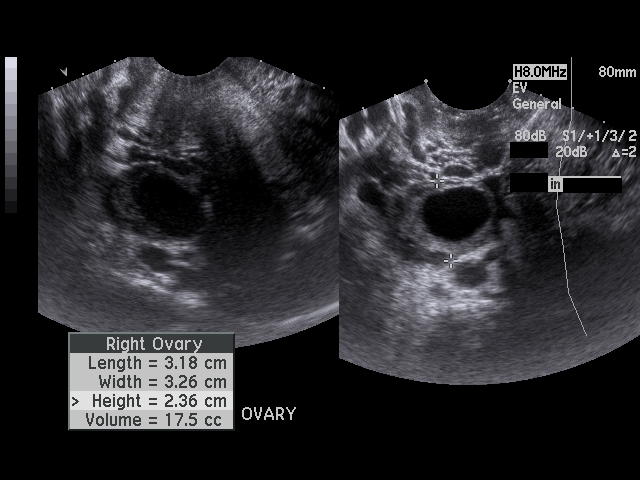
[im 40/51]
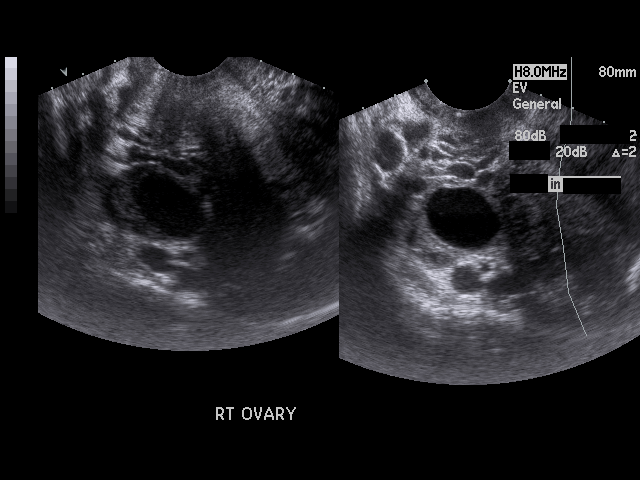
[im 44/51]
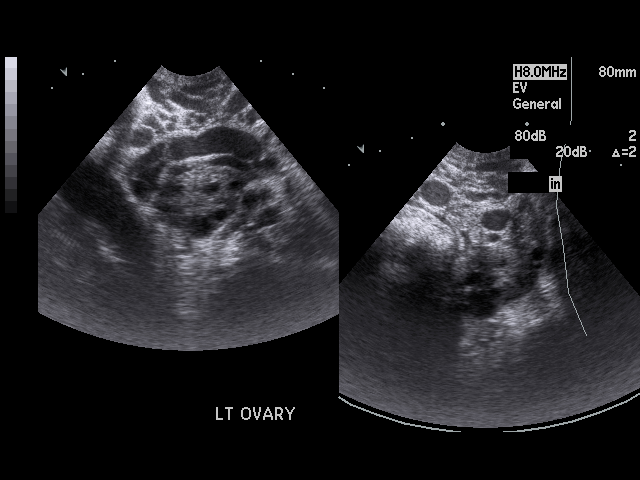
[im 46/51]
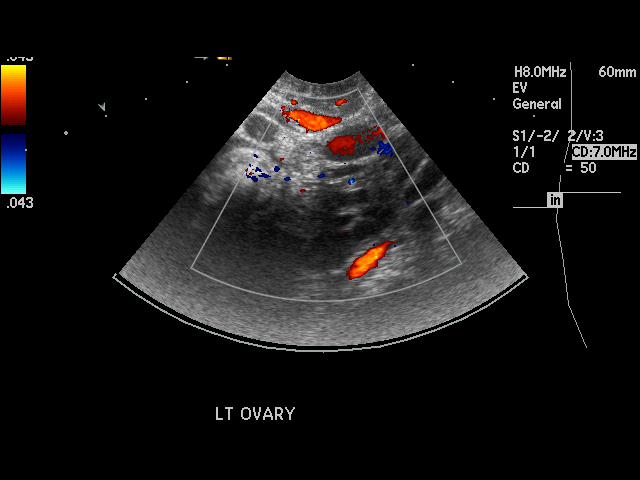
[im 51/51]
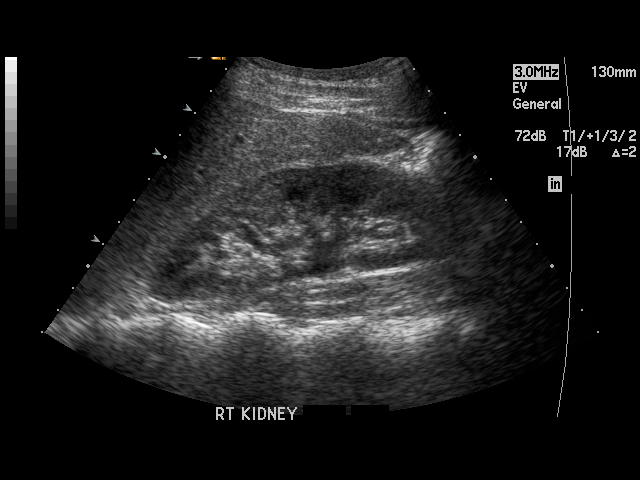

[17 of 25 positions shown; findings below may reference images not displayed]

IMPRESSION: 1.     Retroverted uterus.
2.     There is a 1.8 cm cyst of the right ovary. The cyst is simple.

## 2015-09-18 IMAGING — US US TRANSVAGINAL NON-OB
1 series · 13 of 25 positions shown · non-contrast
Comparison: Ultrasound dated [DATE]

CLINICAL DATA: 34-year-old female with history of endometrial
ablation 5 days ago from heavy bleeding.

EXAM:
TRANSABDOMINAL AND TRANSVAGINAL ULTRASOUND OF PELVIS
TECHNIQUE: Both transabdominal and transvaginal ultrasound examinations of the
pelvis were performed. Transabdominal technique was performed for
global imaging of the pelvis including uterus, ovaries, adnexal
regions, and pelvic cul-de-sac. It was necessary to proceed with
endovaginal exam following the transabdominal exam to visualize the
endometrium and the ovaries.

[Series 1: us transvaginal non-ob · 0.24mm/px · 13 of 69 slices shown]
[im 1/69]
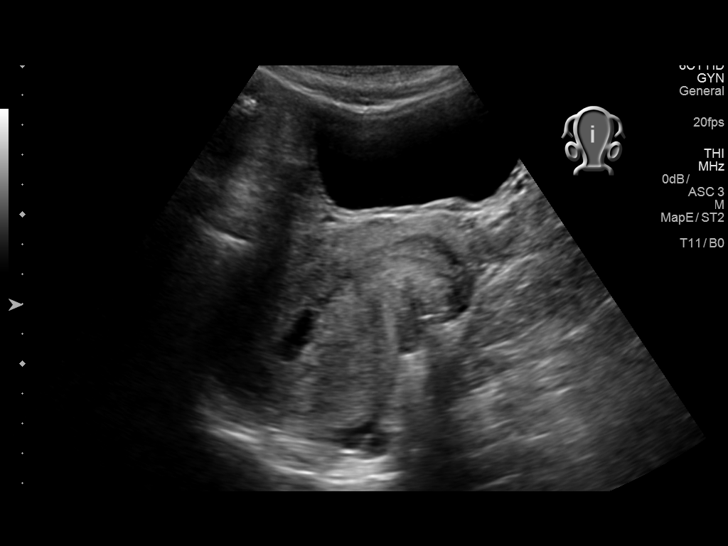
[im 6/69]
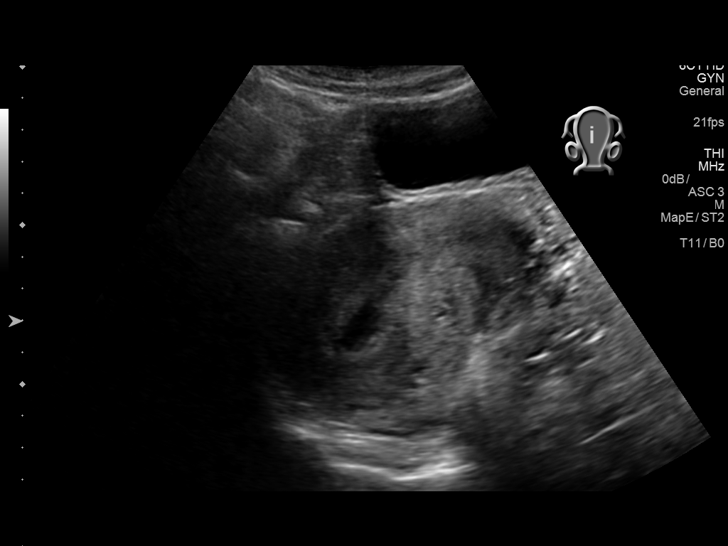
[im 12/69]
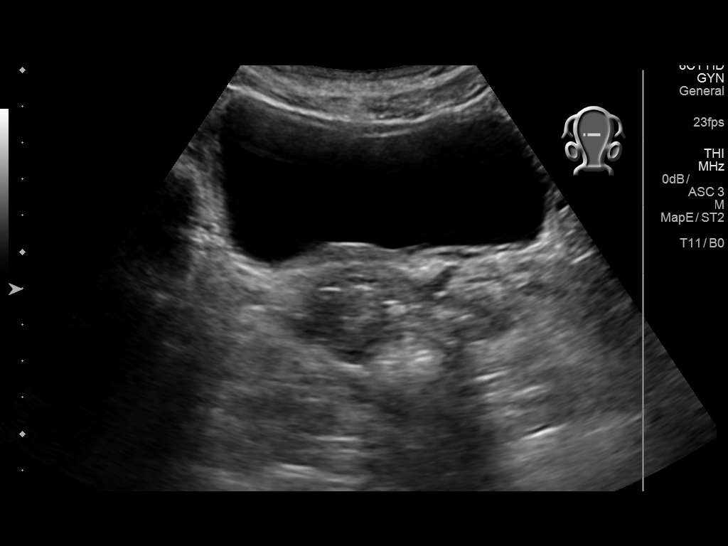
[im 18/69]
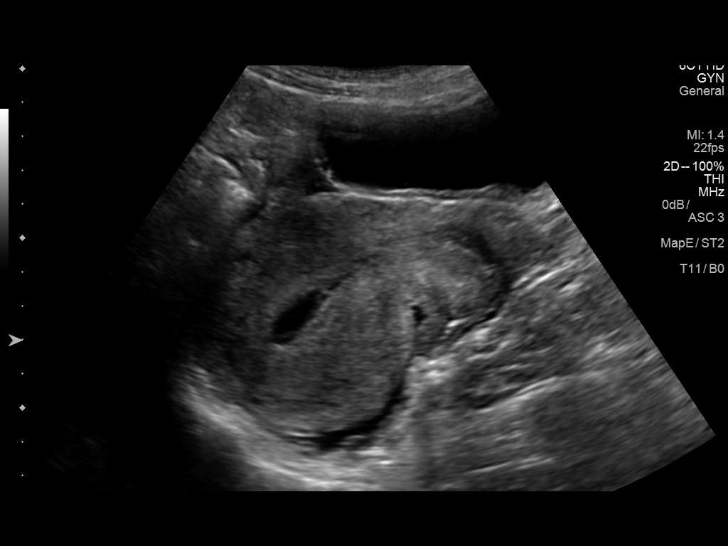
[im 23/69]
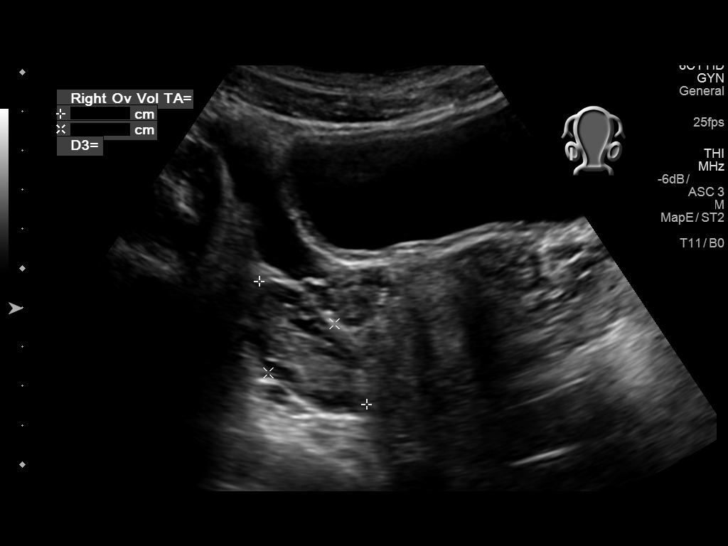
[im 29/69]
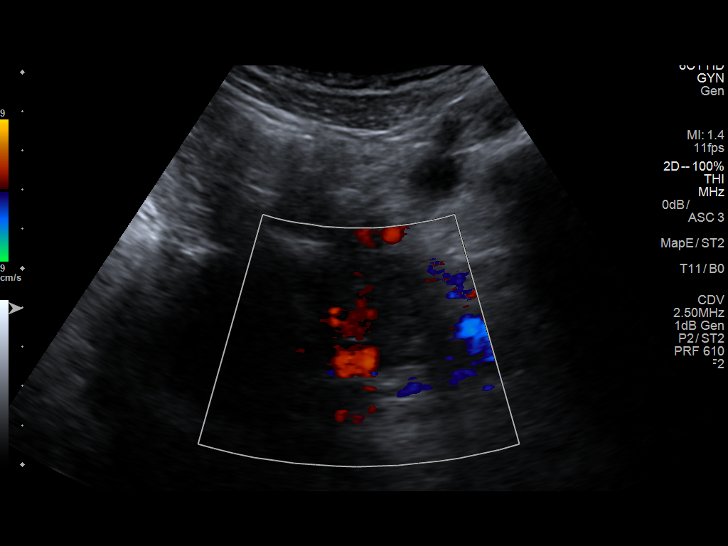
[im 35/69]
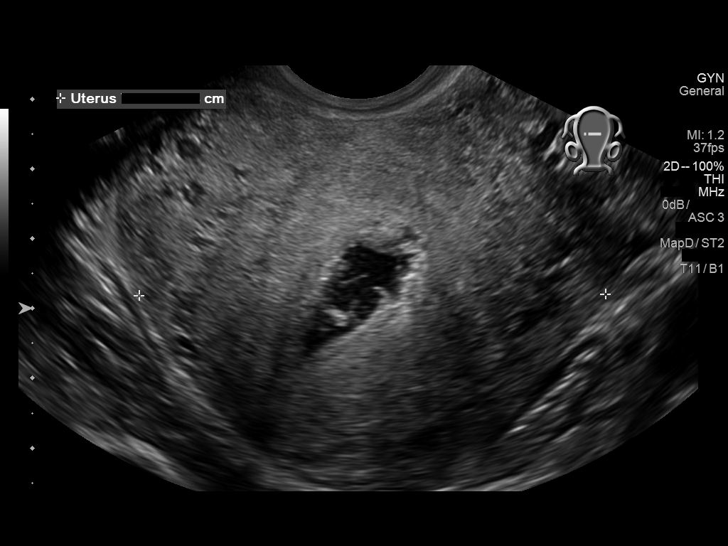
[im 40/69]
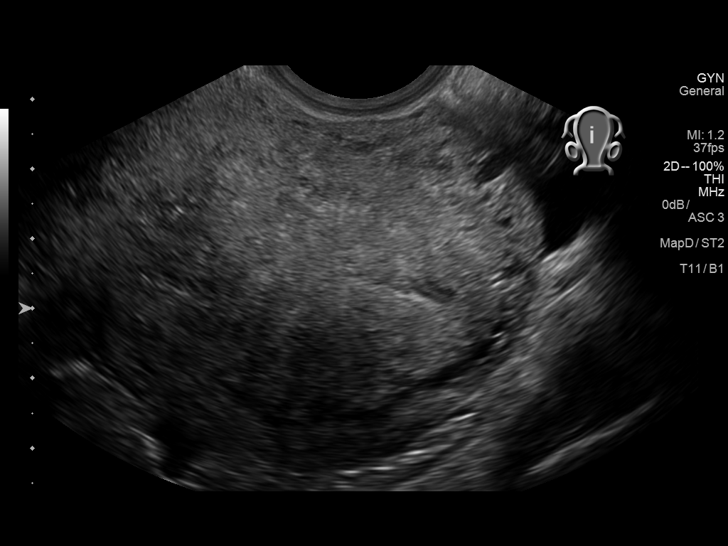
[im 46/69]
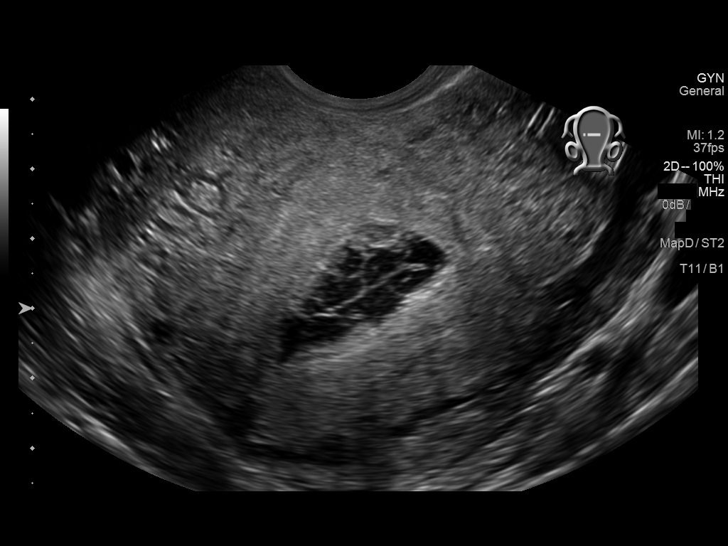
[im 52/69]
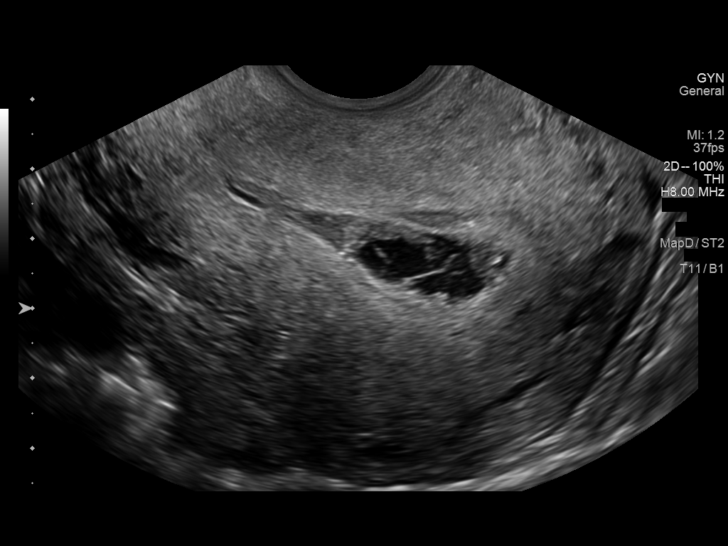
[im 57/69]
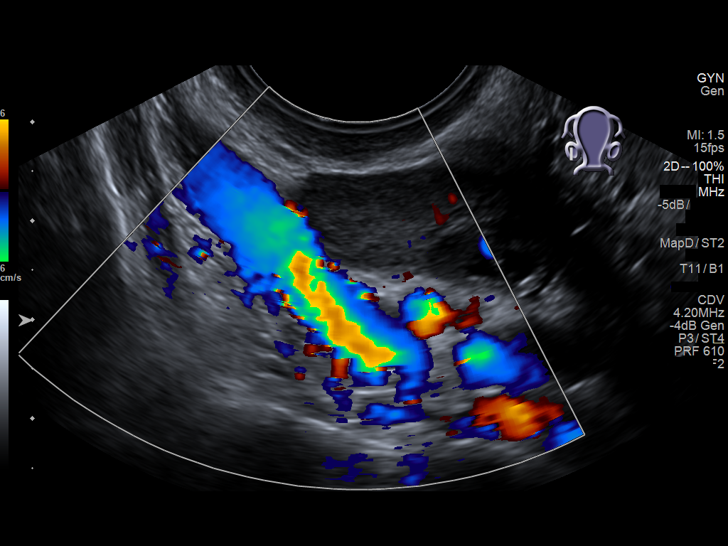
[im 63/69]
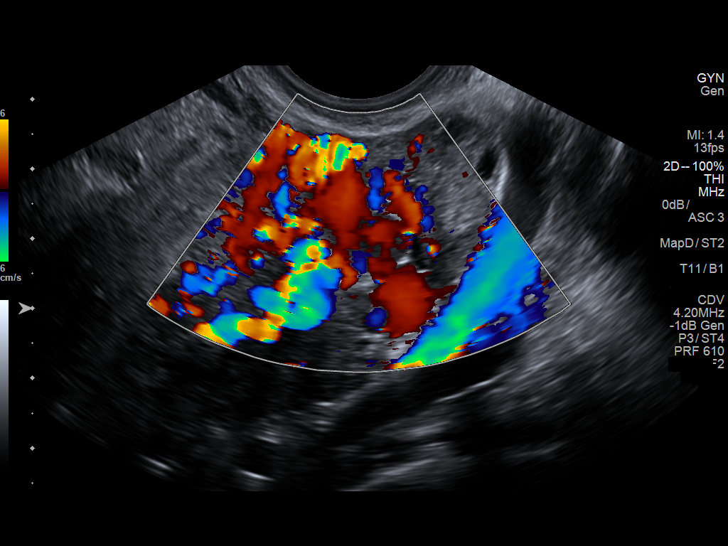
[im 69/69]
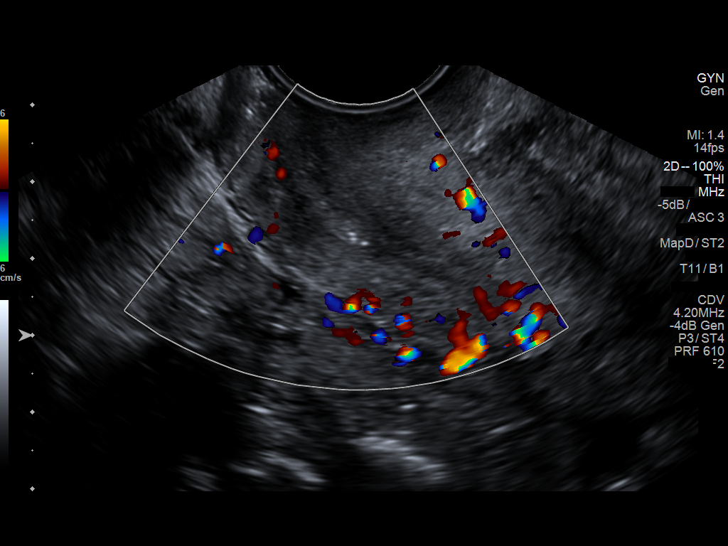

[13 of 25 positions shown; findings below may reference images not displayed]

FINDINGS: Uterus

Measurements: 8.9 x 5.5 x 6.7 cm. The endometrium is retroverted.

Endometrium

Thickness: 1 cm.. Complex fluid with multiple septations noted
within the endometrial canal. There is small amount of echogenic
debris within the endometrial canal with no internal Doppler flow.
Findings likely represent small month old complex fluid or
blood/clot.

Right ovary

Measurements: 4.1 x 2.1 x 2.0 cm. Normal appearance/no adnexal mass.

Left ovary

Measurements: 3.7 x 2.1 x 2.4 cm. Normal appearance/no adnexal mass.
Prominent vasculature noted in the region of the left adnexa.

Other findings

No abnormal free fluid.
IMPRESSION: Small amount of complex fluid and debris within the endometrial
canal likely residual blood product. Clinical correlation is
recommended.

Unremarkable ovaries.

## 2015-12-05 ENCOUNTER — Other Ambulatory Visit: Payer: Self-pay

## 2015-12-06 ENCOUNTER — Encounter
Admission: RE | Admit: 2015-12-06 | Discharge: 2015-12-06 | Disposition: A | Discharge status: Home / Self Care | Payer: Medicaid Other | Source: Ambulatory Visit | Attending: Obstetrics and Gynecology | Admitting: Obstetrics and Gynecology

## 2015-12-06 ENCOUNTER — Encounter: Payer: Self-pay | Admitting: *Deleted

## 2015-12-06 DIAGNOSIS — Z01812 Encounter for preprocedural laboratory examination: Secondary | ICD-10-CM | POA: Insufficient documentation

## 2015-12-06 LAB — COMPREHENSIVE METABOLIC PANEL
ALT: 16 U/L (ref 14–54)
AST: 20 U/L (ref 15–41)
Albumin: 4.8 g/dL (ref 3.5–5.0)
Alkaline Phosphatase: 26 U/L — ABNORMAL LOW (ref 38–126)
Anion gap: 8 (ref 5–15)
BILIRUBIN TOTAL: 0.8 mg/dL (ref 0.3–1.2)
BUN: 8 mg/dL (ref 6–20)
CALCIUM: 9.4 mg/dL (ref 8.9–10.3)
CO2: 27 mmol/L (ref 22–32)
CREATININE: 0.67 mg/dL (ref 0.44–1.00)
Chloride: 104 mmol/L (ref 101–111)
Glucose, Bld: 90 mg/dL (ref 65–99)
Potassium: 3.4 mmol/L — ABNORMAL LOW (ref 3.5–5.1)
Sodium: 139 mmol/L (ref 135–145)
TOTAL PROTEIN: 7.5 g/dL (ref 6.5–8.1)

## 2015-12-06 LAB — CBC WITH DIFFERENTIAL/PLATELET
BASOS ABS: 0 10*3/uL (ref 0–0.1)
Basophils Relative: 0 %
EOS PCT: 3 %
Eosinophils Absolute: 0.2 10*3/uL (ref 0–0.7)
HEMATOCRIT: 42.6 % (ref 35.0–47.0)
Hemoglobin: 13.9 g/dL (ref 12.0–16.0)
LYMPHS ABS: 1.8 10*3/uL (ref 1.0–3.6)
LYMPHS PCT: 25 %
MCH: 29.6 pg (ref 26.0–34.0)
MCHC: 32.6 g/dL (ref 32.0–36.0)
MCV: 90.7 fL (ref 80.0–100.0)
MONO ABS: 0.4 10*3/uL (ref 0.2–0.9)
Monocytes Relative: 6 %
NEUTROS ABS: 4.8 10*3/uL (ref 1.4–6.5)
Neutrophils Relative %: 66 %
Platelets: 139 10*3/uL — ABNORMAL LOW (ref 150–440)
RBC: 4.7 MIL/uL (ref 3.80–5.20)
RDW: 12.6 % (ref 11.5–14.5)
WBC: 7.3 10*3/uL (ref 3.6–11.0)

## 2015-12-06 LAB — PROTIME-INR
INR: 1.02
PROTHROMBIN TIME: 13.6 s (ref 11.4–15.0)

## 2015-12-06 LAB — TYPE AND SCREEN
ABO/RH(D): O POS
Antibody Screen: NEGATIVE

## 2015-12-06 LAB — ABO/RH: ABO/RH(D): O POS

## 2015-12-06 NOTE — Patient Instructions (Addendum)
  Your procedure is scheduled on: 12-07-15 (THURSDAY) Report to [place] To find out your arrival time please call [phone] between 1PM - 3PM on 12-06-15 [facility])  Remember: Instructions that are not followed completely may result in serious medical risk, up to and including death, or upon the discretion of your surgeon and anesthesiologist your surgery may need to be rescheduled.    _X___ 1. Do not eat food or drink liquids after midnight. No gum chewing or hard candies.     _X___ 2. No Alcohol for 24 hours before or after surgery.   ____ 3. Bring all medications with you on the day of surgery if instructed.    _X___ 4. Notify your doctor if there is any change in your medical condition     (cold, fever, infections).     Do not wear jewelry, make-up, hairpins, clips or nail polish.  Do not wear lotions, powders, or perfumes. You may wear deodorant.  Do not shave 48 hours prior to surgery. Men may shave face and neck.  Do not bring valuables to the hospital.    [facility] is not responsible for any belongings or valuables.               Contacts, dentures or bridgework may not be worn into surgery.  Leave your suitcase in the car. After surgery it may be brought to your room.  For patients admitted to the hospital, discharge time is determined by your treatment team.   Patients discharged the day of surgery will not be allowed to drive home.   Please read over the following fact sheets that you were given:     ____ Take these medicines the morning of surgery with A SIP OF WATER:    1. NONE  2.   3.   4.  5.  6.  ____ Fleet Enema (as directed)   ____ Use CHG Soap as directed  ____ Use inhalers on the day of surgery  ____ Stop metformin 2 days prior to surgery    ____ Take 1/2 of usual insulin dose the night before surgery and none on the morning of surgery.   ____ Stop Coumadin/Plavix/aspirin-N/A  ____ Stop  Anti-inflammatories-NO NSAIDS OR ASA PRODUCTS-TYLENOL OK   _X___ Stop supplements until after surgery-STOP BIOTIN NOW  ____ Bring C-Pap to the hospital.

## 2015-12-07 ENCOUNTER — Ambulatory Visit: Payer: Medicaid Other | Admitting: Certified Registered Nurse Anesthetist

## 2015-12-07 ENCOUNTER — Ambulatory Visit
Admission: RE | Admit: 2015-12-07 | Discharge: 2015-12-07 | Disposition: A | Discharge status: Home / Self Care | Payer: Medicaid Other | Source: Ambulatory Visit | Attending: Obstetrics and Gynecology | Admitting: Obstetrics and Gynecology

## 2015-12-07 ENCOUNTER — Encounter: Payer: Self-pay | Admitting: Anesthesiology

## 2015-12-07 ENCOUNTER — Encounter
Admission: RE | Disposition: A | Discharge status: Home / Self Care | Payer: Self-pay | Source: Ambulatory Visit | Attending: Obstetrics and Gynecology

## 2015-12-07 DIAGNOSIS — F410 Panic disorder [episodic paroxysmal anxiety] without agoraphobia: Secondary | ICD-10-CM | POA: Diagnosis not present

## 2015-12-07 DIAGNOSIS — E559 Vitamin D deficiency, unspecified: Secondary | ICD-10-CM | POA: Insufficient documentation

## 2015-12-07 DIAGNOSIS — D696 Thrombocytopenia, unspecified: Secondary | ICD-10-CM | POA: Diagnosis not present

## 2015-12-07 DIAGNOSIS — N920 Excessive and frequent menstruation with regular cycle: Secondary | ICD-10-CM | POA: Diagnosis present

## 2015-12-07 DIAGNOSIS — F4310 Post-traumatic stress disorder, unspecified: Secondary | ICD-10-CM | POA: Diagnosis not present

## 2015-12-07 DIAGNOSIS — N870 Mild cervical dysplasia: Secondary | ICD-10-CM | POA: Insufficient documentation

## 2015-12-07 DIAGNOSIS — N921 Excessive and frequent menstruation with irregular cycle: Secondary | ICD-10-CM | POA: Diagnosis not present

## 2015-12-07 DIAGNOSIS — Z800 Family history of malignant neoplasm of digestive organs: Secondary | ICD-10-CM | POA: Insufficient documentation

## 2015-12-07 DIAGNOSIS — Z803 Family history of malignant neoplasm of breast: Secondary | ICD-10-CM | POA: Insufficient documentation

## 2015-12-07 HISTORY — DX: Headache, unspecified: R51.9

## 2015-12-07 HISTORY — PX: DILITATION & CURRETTAGE/HYSTROSCOPY WITH NOVASURE ABLATION: SHX5568

## 2015-12-07 HISTORY — DX: Unspecified ovarian cyst, unspecified side: N83.209

## 2015-12-07 HISTORY — DX: Post-traumatic stress disorder, unspecified: F43.10

## 2015-12-07 HISTORY — DX: Anxiety disorder, unspecified: F41.9

## 2015-12-07 HISTORY — DX: Anemia, unspecified: D64.9

## 2015-12-07 HISTORY — DX: Thrombocytopenia, unspecified: D69.6

## 2015-12-07 HISTORY — DX: Headache: R51

## 2015-12-07 LAB — POCT PREGNANCY, URINE: Preg Test, Ur: NEGATIVE

## 2015-12-07 SURGERY — DILATATION & CURETTAGE/HYSTEROSCOPY WITH NOVASURE ABLATION
Anesthesia: General | Wound class: Clean Contaminated

## 2015-12-07 MED ORDER — LACTATED RINGERS IV SOLN: INTRAVENOUS | Status: DC

## 2015-12-07 MED ORDER — LIDOCAINE HCL (CARDIAC) 20 MG/ML IV SOLN
INTRAVENOUS | Status: DC | PRN
  Administered 2015-12-07: 60 mg via INTRAVENOUS

## 2015-12-07 MED ORDER — FENTANYL CITRATE (PF) 100 MCG/2ML IJ SOLN
INTRAMUSCULAR | Status: DC | PRN
  Administered 2015-12-07 (×4): 25 ug via INTRAVENOUS

## 2015-12-07 MED ORDER — IBUPROFEN 600 MG PO TABS: 600.0000 mg | ORAL_TABLET | Freq: Four times a day (QID) | ORAL | Status: DC | PRN

## 2015-12-07 MED ORDER — FENTANYL CITRATE (PF) 100 MCG/2ML IJ SOLN
INTRAMUSCULAR | Status: AC
  Filled 2015-12-07: qty 2

## 2015-12-07 MED ORDER — ONDANSETRON HCL 4 MG/2ML IJ SOLN
INTRAMUSCULAR | Status: DC | PRN
  Administered 2015-12-07: 4 mg via INTRAVENOUS

## 2015-12-07 MED ORDER — PROPOFOL 10 MG/ML IV BOLUS
INTRAVENOUS | Status: DC | PRN
  Administered 2015-12-07: 160 mg via INTRAVENOUS
  Administered 2015-12-07: 40 mg via INTRAVENOUS

## 2015-12-07 MED ORDER — FAMOTIDINE 20 MG PO TABS
20.0000 mg | ORAL_TABLET | Freq: Once | ORAL | Status: AC
  Administered 2015-12-07: 20 mg via ORAL

## 2015-12-07 MED ORDER — LACTATED RINGERS IV SOLN
INTRAVENOUS | Status: DC
  Administered 2015-12-07: 125 mL/h via INTRAVENOUS

## 2015-12-07 MED ORDER — MIDAZOLAM HCL 2 MG/2ML IJ SOLN
INTRAMUSCULAR | Status: DC | PRN
  Administered 2015-12-07: 2 mg via INTRAVENOUS

## 2015-12-07 MED ORDER — KETOROLAC TROMETHAMINE 30 MG/ML IJ SOLN
INTRAMUSCULAR | Status: DC | PRN
  Administered 2015-12-07: 30 mg via INTRAVENOUS

## 2015-12-07 MED ORDER — FENTANYL CITRATE (PF) 100 MCG/2ML IJ SOLN
25.0000 ug | INTRAMUSCULAR | Status: DC | PRN
  Administered 2015-12-07 (×2): 25 ug via INTRAVENOUS

## 2015-12-07 MED ORDER — FAMOTIDINE 20 MG PO TABS
ORAL_TABLET | ORAL | Status: AC
  Administered 2015-12-07: 20 mg via ORAL
  Filled 2015-12-07: qty 1

## 2015-12-07 MED ORDER — GLYCOPYRROLATE 0.2 MG/ML IJ SOLN
INTRAMUSCULAR | Status: DC | PRN
  Administered 2015-12-07: 0.2 mg via INTRAVENOUS

## 2015-12-07 MED ORDER — SILVER NITRATE-POT NITRATE 75-25 % EX MISC
CUTANEOUS | Status: AC
  Filled 2015-12-07: qty 2

## 2015-12-07 MED ORDER — ONDANSETRON HCL 4 MG/2ML IJ SOLN: 4.0000 mg | Freq: Once | INTRAMUSCULAR | Status: DC | PRN

## 2015-12-07 MED ORDER — DEXAMETHASONE SODIUM PHOSPHATE 10 MG/ML IJ SOLN
INTRAMUSCULAR | Status: DC | PRN
  Administered 2015-12-07: 8 mg via INTRAVENOUS

## 2015-12-07 MED ORDER — HYDROCODONE-ACETAMINOPHEN 5-325 MG PO TABS: 1.0000 | ORAL_TABLET | Freq: Four times a day (QID) | ORAL | Status: DC | PRN

## 2015-12-07 MED ORDER — SILVER NITRATE-POT NITRATE 75-25 % EX MISC
CUTANEOUS | Status: DC | PRN
  Administered 2015-12-07: 3

## 2015-12-07 SURGICAL SUPPLY — 17 items
CANISTER SUCT 3000ML (MISCELLANEOUS) ×4 IMPLANT
CATH ROBINSON RED A/P 16FR (CATHETERS) ×4 IMPLANT
GLOVE BIO SURGEON STRL SZ7 (GLOVE) ×12 IMPLANT
GLOVE SURG LX 7.5 STRW (GLOVE) ×6
GLOVE SURG LX STRL 7.5 STRW (GLOVE) ×6 IMPLANT
GOWN STRL REUS W/ TWL LRG LVL3 (GOWN DISPOSABLE) ×4 IMPLANT
GOWN STRL REUS W/TWL LRG LVL3 (GOWN DISPOSABLE) ×4
IV LACTATED RINGERS 1000ML (IV SOLUTION) ×4 IMPLANT
KIT RM TURNOVER CYSTO AR (KITS) ×4 IMPLANT
NOVASURE ENDOMETRIAL ABLATION (MISCELLANEOUS) ×4 IMPLANT
NS IRRIG 500ML POUR BTL (IV SOLUTION) ×4 IMPLANT
PACK DNC HYST (MISCELLANEOUS) ×4 IMPLANT
PAD OB MATERNITY 4.3X12.25 (PERSONAL CARE ITEMS) ×4 IMPLANT
PAD PREP 24X41 OB/GYN DISP (PERSONAL CARE ITEMS) ×4 IMPLANT
TOWEL OR 17X26 4PK STRL BLUE (TOWEL DISPOSABLE) ×4 IMPLANT
TUBING CONNECTING 10 (TUBING) ×3 IMPLANT
TUBING CONNECTING 10' (TUBING) ×1

## 2015-12-07 NOTE — Transfer of Care (Signed)
Immediate Anesthesia Transfer of Care Note  Patient: Jessica Peters  Procedure(s) Performed: Procedure(s): [place]  Patient Location: PACU  Anesthesia Type:General  Level of Consciousness: awake, alert  and oriented  Airway & Oxygen Therapy: Patient Spontanous Breathing and Patient connected to face mask oxygen  Post-op Assessment: Report given to RN and Post -op Vital signs reviewed and stable  Post vital signs: Reviewed and stable  Last Vitals:  Filed Vitals:   12/07/15 0837  BP: 130/114  Pulse: 102  Temp: 36.9 C  Resp: 16    Complications: No apparent anesthesia complications

## 2015-12-07 NOTE — H&P (Signed)
History and Physical Interval Note:  Jessica Peters  has presented today for surgery, with the diagnosis of menometrorrhagia  The various methods of treatment have been discussed with the patient and family. After consideration of risks, benefits and other options for treatment, the patient has consented to  Procedure(s): HYSTEROSCOPY WITH NOVASURE (N/A) as a surgical intervention .  The patient's history has been reviewed, patient examined, no change in status, stable for surgery.  I have reviewed the patient's chart and labs.  Questions were answered to the patient's satisfaction.    Will Bonnet, MD 12/07/2015 8:45 AM

## 2015-12-07 NOTE — Op Note (Signed)
Operative Note    12/07/2015  PRE-OP DIAGNOSIS: Menorrhagia with irregular cycles   POST-OP DIAGNOSIS: Menorrhagia with irregular cycles  SURGEON: Will Bonnet, MD  PROCEDURE: Procedure(s): DILATATION & CURETTAGE/HYSTEROSCOPY WITH NOVASURE ABLATION  ANESTHESIA: General   ESTIMATED BLOOD LOSS: min   IV Fluids: 700 mL crystalloid  Urine output: 50 mL   SPECIMENS: endometrial curettings   FLUID DEFICIT: min   COMPLICATIONS: none   DISPOSITION: PACU - hemodynamically stable.   CONDITION: stable   FINDINGS: Exam under anesthesia revealed mobile small uterus with no masses and bilateral adnexa without masses or fullness. Hysteroscopy revealed subseptate uterus, otherwise grossly normal appearing uterine cavity with bilateral tubal ostia and normal appearing endocervical canal.   PROCEDURE IN DETAIL: After informed consent was obtained, the patient was taken to the operating room where anesthesia was obtained without difficulty. The patient was positioned in the dorsal lithotomy position in candy cane stirrups. The patient's bladder was catheterized with an in and out foley catheter. The patient was examined under anesthesia, with the above noted findings. The sterile, bivalved speculum was placed inside the patient's vagina, and the the anterior lip of the cervix was seen and grasped with the tenaculum. The uterine cavity was sounded to 8 cm, and then the cervix was progressively dilated to a 65mm by Hagar dilator. The 30 degree hysteroscope was introduced, with LR fluid used to distend the intrauterine cavity, with the above noted findings.   The hystersocope was removed and the uterine cavity was curetted until a gritty texture was noted, yielding endometrial curettings. Repeat hysteroscopy performed, with improved contour and lining of uterus noted.  Excellent hemostasis was noted.   The NovaSure device was then placed without difficulty. Measurements were obtained. Patient was  noted to have a uterine length of 8 cm, a cervical length of 4 cm, and a cavity width of 4.6 cm. The NovaSure device is first tested and after confirmation the procedures performed. Length of procedure was 120 seconds. The NovaSure device is then removed and repeat hysteroscopy reveals an appropriate lining of the uterus and no perforation or injury. Hysteroscope is removed with minimal discrepancy of fluid.   Tenaculum was removed with excellent hemostasis noted after application of silver nitrate. The vagina was inspected and no instruments or sponges remained.  She was then taken out of dorsal lithotomy.    The patient tolerated the procedure well. Sponge, lap and needle counts were correct x2. The patient was taken to recovery room in excellent condition.  She wore SCDs throughout the case for VTE prophylaxis.   Prentice Docker, MD 12/07/2015 10:45 AM

## 2015-12-07 NOTE — Discharge Instructions (Signed)
AMBULATORY SURGERY  °DISCHARGE INSTRUCTIONS ° ° °1) The drugs that you were given will stay in your system until tomorrow so for the next 24 hours you should not: ° °A) Drive an automobile °B) Make any legal decisions °C) Drink any alcoholic beverage ° ° °2) You may resume regular meals tomorrow.  Today it is better to start with liquids and gradually work up to solid foods. ° °You may eat anything you prefer, but it is better to start with liquids, then soup and crackers, and gradually work up to solid foods. ° ° °3) Please notify your doctor immediately if you have any unusual bleeding, trouble breathing, redness and pain at the surgery site, drainage, fever, or pain not relieved by medication. ° ° ° °4) Additional Instructions: ° ° ° ° ° ° ° °Please contact your physician with any problems or Same Day Surgery at [phone], Monday through Friday 6 am to 4 pm, or [place] at [place] Main number at [phone].AMBULATORY SURGERY  °DISCHARGE INSTRUCTIONS ° ° °5) The drugs that you were given will stay in your system until tomorrow so for the next 24 hours you should not: ° °D) Drive an automobile °E) Make any legal decisions °F) Drink any alcoholic beverage ° ° °6) You may resume regular meals tomorrow.  Today it is better to start with liquids and gradually work up to solid foods. ° °You may eat anything you prefer, but it is better to start with liquids, then soup and crackers, and gradually work up to solid foods. ° ° °7) Please notify your doctor immediately if you have any unusual bleeding, trouble breathing, redness and pain at the surgery site, drainage, fever, or pain not relieved by medication. ° ° ° °8) Additional Instructions: ° ° ° ° ° ° ° °Please contact your physician with any problems or Same Day Surgery at [phone], Monday through Friday 6 am to 4 pm, or [place] at [place] Main number at [phone]. °

## 2015-12-07 NOTE — Anesthesia Preprocedure Evaluation (Signed)
Anesthesia Evaluation  Patient identified by MRN, date of birth, ID band Patient awake    Reviewed: Allergy & Precautions, NPO status , Patient's Chart, lab work & pertinent test results  Airway Mallampati: II  TM Distance: >3 FB Neck ROM: Full    Dental  (+) Chipped   Pulmonary Current Smoker,    Pulmonary exam normal breath sounds clear to auscultation       Cardiovascular negative cardio ROS Normal cardiovascular exam     Neuro/Psych  Headaches, Anxiety PTSD   GI/Hepatic negative GI ROS, Neg liver ROS,   Endo/Other  negative endocrine ROS  Renal/GU negative Renal ROS  Female GU complaint Ovarian cyst negative genitourinary   Musculoskeletal negative musculoskeletal ROS (+)   Abdominal Normal abdominal exam  (+)   Peds negative pediatric ROS (+)  Hematology  (+) anemia ,   Anesthesia Other Findings   Reproductive/Obstetrics negative OB ROS                             Anesthesia Physical Anesthesia Plan  ASA: II  Anesthesia Plan: General   Post-op Pain Management:    Induction: Intravenous  Airway Management Planned: LMA  Additional Equipment:   Intra-op Plan:   Post-operative Plan: Extubation in OR  Informed Consent: I have reviewed the patients History and Physical, chart, labs and discussed the procedure including the risks, benefits and alternatives for the proposed anesthesia with the patient or authorized representative who has indicated his/her understanding and acceptance.   Dental advisory given  Plan Discussed with: CRNA and Surgeon  Anesthesia Plan Comments:         Anesthesia Quick Evaluation

## 2015-12-08 LAB — SURGICAL PATHOLOGY

## 2015-12-08 NOTE — Anesthesia Postprocedure Evaluation (Signed)
Anesthesia Post Note  Patient: Jessica Peters  Procedure(s) Performed: Procedure(s): [place] ABLATION  Patient location during evaluation: PACU Anesthesia Type: General Level of consciousness: awake and alert and oriented Pain management: pain level controlled Vital Signs Assessment: post-procedure vital signs reviewed and stable Respiratory status: spontaneous breathing Cardiovascular status: blood pressure returned to baseline Anesthetic complications: no    Last Vitals:  Filed Vitals:   12/07/15 1136 12/07/15 1204  BP: 103/70 95/76  Pulse: 73 68  Temp: 36.5 C   Resp: 16     Last Pain:  Filed Vitals:   12/07/15 1206  PainSc: 1                  Erich Kochan

## 2015-12-12 ENCOUNTER — Emergency Department
Admission: EM | Admit: 2015-12-12 | Discharge: 2015-12-13 | Disposition: A | Discharge status: Home / Self Care | Payer: Medicaid Other | Attending: Emergency Medicine | Admitting: Emergency Medicine

## 2015-12-12 ENCOUNTER — Encounter: Payer: Self-pay | Admitting: Emergency Medicine

## 2015-12-12 DIAGNOSIS — F17210 Nicotine dependence, cigarettes, uncomplicated: Secondary | ICD-10-CM | POA: Diagnosis not present

## 2015-12-12 DIAGNOSIS — N938 Other specified abnormal uterine and vaginal bleeding: Secondary | ICD-10-CM | POA: Diagnosis not present

## 2015-12-12 DIAGNOSIS — R51 Headache: Secondary | ICD-10-CM | POA: Insufficient documentation

## 2015-12-12 DIAGNOSIS — N719 Inflammatory disease of uterus, unspecified: Secondary | ICD-10-CM | POA: Diagnosis not present

## 2015-12-12 DIAGNOSIS — Z3202 Encounter for pregnancy test, result negative: Secondary | ICD-10-CM | POA: Insufficient documentation

## 2015-12-12 DIAGNOSIS — R109 Unspecified abdominal pain: Secondary | ICD-10-CM

## 2015-12-12 DIAGNOSIS — G8918 Other acute postprocedural pain: Secondary | ICD-10-CM | POA: Diagnosis present

## 2015-12-12 DIAGNOSIS — Z79899 Other long term (current) drug therapy: Secondary | ICD-10-CM | POA: Insufficient documentation

## 2015-12-12 LAB — CBC
HCT: 43.1 % (ref 35.0–47.0)
HEMOGLOBIN: 14.5 g/dL (ref 12.0–16.0)
MCH: 30.6 pg (ref 26.0–34.0)
MCHC: 33.7 g/dL (ref 32.0–36.0)
MCV: 91 fL (ref 80.0–100.0)
Platelets: 57 10*3/uL — ABNORMAL LOW (ref 150–440)
RBC: 4.74 MIL/uL (ref 3.80–5.20)
RDW: 12.5 % (ref 11.5–14.5)
WBC: 5.1 10*3/uL (ref 3.6–11.0)

## 2015-12-12 LAB — URINALYSIS COMPLETE WITH MICROSCOPIC (ARMC ONLY)
Bilirubin Urine: NEGATIVE
Glucose, UA: NEGATIVE mg/dL
KETONES UR: NEGATIVE mg/dL
Nitrite: NEGATIVE
PH: 5 (ref 5.0–8.0)
PROTEIN: 30 mg/dL — AB
Specific Gravity, Urine: 1.015 (ref 1.005–1.030)

## 2015-12-12 LAB — COMPREHENSIVE METABOLIC PANEL
ALBUMIN: 4.3 g/dL (ref 3.5–5.0)
ALK PHOS: 22 U/L — AB (ref 38–126)
ALT: 16 U/L (ref 14–54)
ANION GAP: 6 (ref 5–15)
AST: 19 U/L (ref 15–41)
BUN: 13 mg/dL (ref 6–20)
CHLORIDE: 104 mmol/L (ref 101–111)
CO2: 24 mmol/L (ref 22–32)
Calcium: 8.9 mg/dL (ref 8.9–10.3)
Creatinine, Ser: 0.68 mg/dL (ref 0.44–1.00)
GFR calc Af Amer: >60 mL/min (ref >60)
GFR calc non Af Amer: >60 mL/min (ref >60)
GLUCOSE: 108 mg/dL — AB (ref 65–99)
POTASSIUM: 3.8 mmol/L (ref 3.5–5.1)
SODIUM: 134 mmol/L — AB (ref 135–145)
Total Bilirubin: 0.6 mg/dL (ref 0.3–1.2)
Total Protein: 7.1 g/dL (ref 6.5–8.1)

## 2015-12-12 LAB — POCT PREGNANCY, URINE: Preg Test, Ur: NEGATIVE

## 2015-12-12 MED ORDER — METOCLOPRAMIDE HCL 5 MG/ML IJ SOLN
10.0000 mg | Freq: Once | INTRAMUSCULAR | Status: AC
  Administered 2015-12-13: 10 mg via INTRAVENOUS
  Filled 2015-12-12: qty 2

## 2015-12-12 MED ORDER — SODIUM CHLORIDE 0.9 % IV BOLUS (SEPSIS)
1000.0000 mL | Freq: Once | INTRAVENOUS | Status: AC
  Administered 2015-12-13: 1000 mL via INTRAVENOUS

## 2015-12-12 MED ORDER — KETOROLAC TROMETHAMINE 30 MG/ML IJ SOLN
30.0000 mg | Freq: Once | INTRAMUSCULAR | Status: AC
  Administered 2015-12-13: 30 mg via INTRAVENOUS
  Filled 2015-12-12: qty 1

## 2015-12-12 MED ORDER — DIPHENHYDRAMINE HCL 50 MG/ML IJ SOLN
25.0000 mg | Freq: Once | INTRAMUSCULAR | Status: AC
  Administered 2015-12-13: 25 mg via INTRAVENOUS
  Filled 2015-12-12: qty 1

## 2015-12-12 NOTE — ED Notes (Signed)
Pt to triage via w/c with no distress noted; Pt reports endometrial ablation last Thursday; fever this evening and body aches with HA; did not take any antipyretics PTA

## 2015-12-12 NOTE — ED Provider Notes (Signed)
[facility] Emergency Department Provider Note  ____________________________________________  Time seen: Approximately 11:43 PM  I have reviewed the triage vital signs and the nursing notes.   HISTORY  Chief Complaint Post-op Problem    HPI Jessica Peters is a 35 y.o. female who had an endometrial ablation on Thursday. The patient reports that initially the pain was getting better but then it seemed to get worse. She reports that she has been bleeding on and off. She reports that this morning she woke up with a migraine and nausea. She took her Norco as well as some ibuprofen and she reports that she fell asleep. She wake up tonight and discovered she had a temperature of 12 so she decided to come in. She reports that she's having some lower abdominal pain and headache that she rates a 9 out of 10 in intensity. The patient reports that she is also had some heavier bleeding and cold and hot chills on and off. The patient did not call Dr. Marisue [place] office because it was after 5 PM when she noticed the fever. She denies any foul smell to the bleeding and reports that it seems to have stopped at this point. The patient is concerned and wants the pain so she decided to come in for evaluation.   Past Medical History  Diagnosis Date  . Ovarian cyst   . Anxiety   . PTSD (post-traumatic stress disorder)   . Thrombocytopenia ([place])   . Anemia   . Headache     h/o migraines    Patient Active Problem List   Diagnosis Date Noted  . Menorrhagia with irregular cycle 12/07/2015    Past Surgical History  Procedure Laterality Date  . Tubal ligation    . Tonsillectomy    . Mouth surgery    . Dilitation & currettage/hystroscopy with novasure ablation  12/07/2015    Procedure: DILATATION & CURETTAGE/HYSTEROSCOPY WITH NOVASURE ABLATION;  Surgeon: Will Bonnet, MD;  Location: [facility] ORS;  Service: Gynecology;;    Current Outpatient Rx  Name  Route  Sig  Dispense   Refill  . BIOTIN PO   Oral   Take 1 tablet by mouth daily.         . Cholecalciferol (VITAMIN D3) 5000 units TABS   Oral   Take 1 tablet by mouth once a week.         . ferrous sulfate 325 (65 FE) MG tablet   Oral   Take 325 mg by mouth daily with breakfast.         . HYDROcodone-acetaminophen (NORCO) 5-325 MG tablet   Oral   Take 1 tablet by mouth every 6 (six) hours as needed for moderate pain.   30 tablet   0   . ibuprofen (ADVIL,MOTRIN) 600 MG tablet   Oral   Take 1 tablet (600 mg total) by mouth every 6 (six) hours as needed for mild pain or cramping.   30 tablet   0   . doxycycline (VIBRA-TABS) 100 MG tablet   Oral   Take 1 tablet (100 mg total) by mouth 2 (two) times daily.   14 tablet   0   . metroNIDAZOLE (FLAGYL) 500 MG tablet   Oral   Take 1 tablet (500 mg total) by mouth 2 (two) times daily.   14 tablet   0   . oxyCODONE-acetaminophen (ROXICET) 5-325 MG tablet   Oral   Take 1 tablet by mouth every 6 (six) hours as needed.  12 tablet   0     Allergies Review of patient's allergies indicates no known allergies.  No family history on file.  Social History Social History  Substance Use Topics  . Smoking status: Current Every Day Smoker -- 1.00 packs/day for 13 years    Types: Cigarettes  . Smokeless tobacco: None  . Alcohol Use: Yes     Comment: socially    Review of Systems Constitutional:  fever/chills Eyes: No visual changes. ENT: No sore throat. Cardiovascular: Denies chest pain. Respiratory: Denies shortness of breath. Gastrointestinal:  abdominal pain and nausea, no vomiting.  No diarrhea.  No constipation. Genitourinary: Vaginal bleeding Musculoskeletal: Negative for back pain. Skin: Negative for rash. Neurological: Headache  10-point ROS otherwise negative.  ____________________________________________   PHYSICAL EXAM:  VITAL SIGNS: ED Triage Vitals  Enc Vitals Group     BP 12/12/15 2009 113/78 mmHg     Pulse  Rate 12/12/15 2009 110     Resp 12/12/15 2009 20     Temp 12/12/15 2009 99.1 F (37.3 C)     Temp Source 12/12/15 2009 Oral     SpO2 12/12/15 2009 95 %     Weight 12/12/15 2009 150 lb (68.04 kg)     Height 12/12/15 2009 5\' 10"  (1.778 m)     Head Cir --      Peak Flow --      Pain Score 12/12/15 2009 10     Pain Loc --      Pain Edu? --      Excl. in [place]? --     Constitutional: Alert and oriented. Well appearing and in moderate distress. Eyes: Conjunctivae are normal. PERRL. EOMI. Head: Atraumatic. Nose: No congestion/rhinnorhea. Mouth/Throat: Mucous membranes are moist.  Oropharynx non-erythematous. Cardiovascular: Normal rate, regular rhythm. Grossly normal heart sounds.  Good peripheral circulation. Respiratory: Normal respiratory effort.  No retractions. Lungs CTAB. Gastrointestinal: Soft with diffuse abdominal tenderness to palpation. No distention. Positive bowel sounds Genitourinary: Normal external genitalia. Some dark red blood in the vaginal vault with some cervical motion tenderness and uterine tenderness to palpation. Musculoskeletal: No lower extremity tenderness nor edema.  Neurologic:  Normal speech and language. . Skin:  Skin is warm, dry and intact.  Psychiatric: Mood and affect are normal.   ____________________________________________   LABS (all labs ordered are listed, but only abnormal results are displayed)  Labs Reviewed  WET PREP, GENITAL - Abnormal; Notable for the following:    Clue Cells Wet Prep HPF POC MODERATE (*)    All other components within normal limits  URINALYSIS COMPLETEWITH MICROSCOPIC ([facility] ONLY) - Abnormal; Notable for the following:    Color, Urine YELLOW (*)    APPearance CLOUDY (*)    Hgb urine dipstick 1+ (*)    Protein, ur 30 (*)    Leukocytes, UA 2+ (*)    Bacteria, UA MANY (*)    Squamous Epithelial / LPF 6-30 (*)    All other components within normal limits  CBC - Abnormal; Notable for the following:    Platelets 57 (*)     All other components within normal limits  COMPREHENSIVE METABOLIC PANEL - Abnormal; Notable for the following:    Sodium 134 (*)    Glucose, Bld 108 (*)    Alkaline Phosphatase 22 (*)    All other components within normal limits  CHLAMYDIA/NGC RT PCR ([facility] ONLY)  POC URINE PREG, ED  POCT PREGNANCY, URINE   ____________________________________________  EKG  None ____________________________________________  RADIOLOGY  Pelvic ultrasound: Small amount of complex fluid and debris within the endometrial canal likely residual blood products, clinical correlation is recommended, unremarkable ovaries ____________________________________________   PROCEDURES  Procedure(s) performed: None  Critical Care performed: No  ____________________________________________   INITIAL IMPRESSION / ASSESSMENT AND PLAN / ED COURSE  Pertinent labs & imaging results that were available during my care of the patient were reviewed by me and considered in my medical decision making (see chart for details).  This is a 35 year old female who has a history of an endometrial ablation done on Thursday. The patient comes in with fevers increase belly pain and increased bleeding. The patient also has a migraine headache. The concern is that the patient may have some endometritis associated with her recent procedure. The patient does not have an elevated white blood cell count I will perform a vaginal exam as well as an ultrasound and give the patient some antibiotics. Then I will contact OB/GYN to determine if they will admit the patient for IV antibiotics.  I did give the patient a dose of doxycycline and Flagyl. I contacted Dr. Kenton [facility] and discussed if the patient needed to be admitted to the hospital or discharged to home. He reports that for endometritis he usually will keep the person in the hospital until her white blood cell count goes down before he discharges, oral antibiotics but  this patient does not have an elevated white blood cell count and she is well-appearing. He feels that the patient is stable to follow-up with Dr. Glennon Mac in the morning and return if any of her symptoms should worsen. The patient did receive a dose of morphine for her pain and after resting and a second liter of normal saline she reports that she feels much improved. The patient did have some blood pressures in the 90s but she reports that her blood pressure is typically low in the 90s. The patient had no dizziness and no other complaints. She will be discharged with some antibiotics to follow-up with Dr. Glennon Mac in the office. ____________________________________________   FINAL CLINICAL IMPRESSION(S) / ED DIAGNOSES  Final diagnoses:  Abdominal pain  Endometritis      Loney Hering, MD 12/13/15 [phone]

## 2015-12-13 ENCOUNTER — Emergency Department: Payer: Medicaid Other

## 2015-12-13 LAB — CHLAMYDIA/NGC RT PCR (ARMC ONLY)
CHLAMYDIA TR: NOT DETECTED
N GONORRHOEAE: NOT DETECTED

## 2015-12-13 MED ORDER — METRONIDAZOLE IN NACL 5-0.79 MG/ML-% IV SOLN
500.0000 mg | Freq: Three times a day (TID) | INTRAVENOUS | Status: DC
  Administered 2015-12-13: 500 mg via INTRAVENOUS
  Filled 2015-12-13: qty 100

## 2015-12-13 MED ORDER — DOXYCYCLINE HYCLATE 100 MG PO TABS: 100.0000 mg | ORAL_TABLET | Freq: Two times a day (BID) | ORAL | Status: DC

## 2015-12-13 MED ORDER — SODIUM CHLORIDE 0.9 % IV BOLUS (SEPSIS)
1000.0000 mL | Freq: Once | INTRAVENOUS | Status: AC
  Administered 2015-12-13: 1000 mL via INTRAVENOUS

## 2015-12-13 MED ORDER — OXYCODONE-ACETAMINOPHEN 5-325 MG PO TABS
1.0000 | ORAL_TABLET | Freq: Four times a day (QID) | ORAL | Status: DC | PRN
Start: 2015-12-13 — End: 2017-06-13

## 2015-12-13 MED ORDER — DOXYCYCLINE HYCLATE 100 MG PO TABS
ORAL_TABLET | ORAL | Status: AC
  Filled 2015-12-13: qty 1

## 2015-12-13 MED ORDER — OXYCODONE-ACETAMINOPHEN 5-325 MG PO TABS
1.0000 | ORAL_TABLET | Freq: Once | ORAL | Status: AC
  Administered 2015-12-13: 1 via ORAL
  Filled 2015-12-13: qty 1

## 2015-12-13 MED ORDER — MORPHINE SULFATE (PF) 4 MG/ML IV SOLN
4.0000 mg | Freq: Once | INTRAVENOUS | Status: AC
  Administered 2015-12-13: 4 mg via INTRAVENOUS
  Filled 2015-12-13: qty 1

## 2015-12-13 MED ORDER — DOXYCYCLINE HYCLATE 100 MG IV SOLR
100.0000 mg | Freq: Once | INTRAVENOUS | Status: AC
  Administered 2015-12-13: 100 mg via INTRAVENOUS
  Filled 2015-12-13: qty 100

## 2015-12-13 MED ORDER — METRONIDAZOLE 500 MG PO TABS: 500.0000 mg | ORAL_TABLET | Freq: Two times a day (BID) | ORAL | Status: DC

## 2015-12-13 NOTE — Discharge Instructions (Signed)

## 2015-12-13 NOTE — ED Notes (Signed)
Patient transported to Ultrasound 

## 2015-12-13 NOTE — ED Notes (Signed)
Patient transported from Ultrasound 

## 2015-12-15 LAB — URINE CULTURE: Culture: >=100000

## 2015-12-15 LAB — WET PREP, GENITAL
TRICH WET PREP: NONE SEEN
WBC WET PREP: NONE SEEN
YEAST WET PREP: NONE SEEN

## 2016-06-15 DIAGNOSIS — F17210 Nicotine dependence, cigarettes, uncomplicated: Secondary | ICD-10-CM | POA: Insufficient documentation

## 2016-06-15 DIAGNOSIS — Y929 Unspecified place or not applicable: Secondary | ICD-10-CM | POA: Insufficient documentation

## 2016-06-15 DIAGNOSIS — S0240EA Zygomatic fracture, right side, initial encounter for closed fracture: Secondary | ICD-10-CM | POA: Diagnosis not present

## 2016-06-15 DIAGNOSIS — Y9367 Activity, basketball: Secondary | ICD-10-CM | POA: Insufficient documentation

## 2016-06-15 DIAGNOSIS — W010XXA Fall on same level from slipping, tripping and stumbling without subsequent striking against object, initial encounter: Secondary | ICD-10-CM | POA: Diagnosis not present

## 2016-06-15 DIAGNOSIS — Y999 Unspecified external cause status: Secondary | ICD-10-CM | POA: Insufficient documentation

## 2016-06-15 DIAGNOSIS — S0990XA Unspecified injury of head, initial encounter: Secondary | ICD-10-CM | POA: Diagnosis present

## 2016-06-16 ENCOUNTER — Encounter: Payer: Self-pay | Admitting: Emergency Medicine

## 2016-06-16 ENCOUNTER — Emergency Department: Payer: Medicaid Other

## 2016-06-16 ENCOUNTER — Emergency Department
Admission: EM | Admit: 2016-06-16 | Discharge: 2016-06-16 | Payer: Medicaid Other | Attending: Emergency Medicine | Admitting: Emergency Medicine

## 2016-06-16 DIAGNOSIS — S0230XA Fracture of orbital floor, unspecified side, initial encounter for closed fracture: Secondary | ICD-10-CM

## 2016-06-16 DIAGNOSIS — S0280XA Fracture of other specified skull and facial bones, unspecified side, initial encounter for closed fracture: Secondary | ICD-10-CM

## 2016-06-16 DIAGNOSIS — S02402A Zygomatic fracture, unspecified, initial encounter for closed fracture: Secondary | ICD-10-CM

## 2016-06-16 DIAGNOSIS — S02401A Maxillary fracture, unspecified, initial encounter for closed fracture: Secondary | ICD-10-CM

## 2016-06-16 DIAGNOSIS — W19XXXA Unspecified fall, initial encounter: Secondary | ICD-10-CM

## 2016-06-16 LAB — BASIC METABOLIC PANEL
Anion gap: 8 (ref 5–15)
BUN: 8 mg/dL (ref 6–20)
CALCIUM: 8.7 mg/dL — AB (ref 8.9–10.3)
CO2: 25 mmol/L (ref 22–32)
CREATININE: 0.69 mg/dL (ref 0.44–1.00)
Chloride: 106 mmol/L (ref 101–111)
GFR calc Af Amer: >60 mL/min (ref >60)
Glucose, Bld: 102 mg/dL — ABNORMAL HIGH (ref 65–99)
POTASSIUM: 3.4 mmol/L — AB (ref 3.5–5.1)
SODIUM: 139 mmol/L (ref 135–145)

## 2016-06-16 LAB — CBC WITH DIFFERENTIAL/PLATELET
BASOS ABS: 0 10*3/uL (ref 0–0.1)
BASOS PCT: 0 %
EOS ABS: 0 10*3/uL (ref 0–0.7)
EOS PCT: 1 %
HEMATOCRIT: 40.5 % (ref 35.0–47.0)
HEMOGLOBIN: 14 g/dL (ref 12.0–16.0)
LYMPHS PCT: 18 %
Lymphs Abs: 1.5 10*3/uL (ref 1.0–3.6)
MCH: 31.3 pg (ref 26.0–34.0)
MCHC: 34.7 g/dL (ref 32.0–36.0)
MCV: 90.4 fL (ref 80.0–100.0)
Monocytes Absolute: 0.4 10*3/uL (ref 0.2–0.9)
Monocytes Relative: 4 %
Neutro Abs: 6.4 10*3/uL (ref 1.4–6.5)
Neutrophils Relative %: 77 %
Platelets: 139 10*3/uL — ABNORMAL LOW (ref 150–440)
RBC: 4.48 MIL/uL (ref 3.80–5.20)
RDW: 12.8 % (ref 11.5–14.5)
WBC: 8.3 10*3/uL (ref 3.6–11.0)

## 2016-06-16 IMAGING — CT CT CERVICAL SPINE W/O CM
3 of 4 series · 10 of 33 positions shown, 12 images · non-contrast
Comparison: CT scan of the head and face earlier today

CLINICAL DATA: 34-year-old female with cervical spine pain status
post fall.

EXAM:
CT CERVICAL SPINE WITHOUT CONTRAST
TECHNIQUE: Multidetector CT imaging of the cervical spine was performed without
intravenous contrast. Multiplanar CT image reconstructions were also
generated.

[Series 5: sagittal bone · sagittal · 0.22mm/px · 5 of 52 slices shown, 6 images]
[im 18/52  bone]
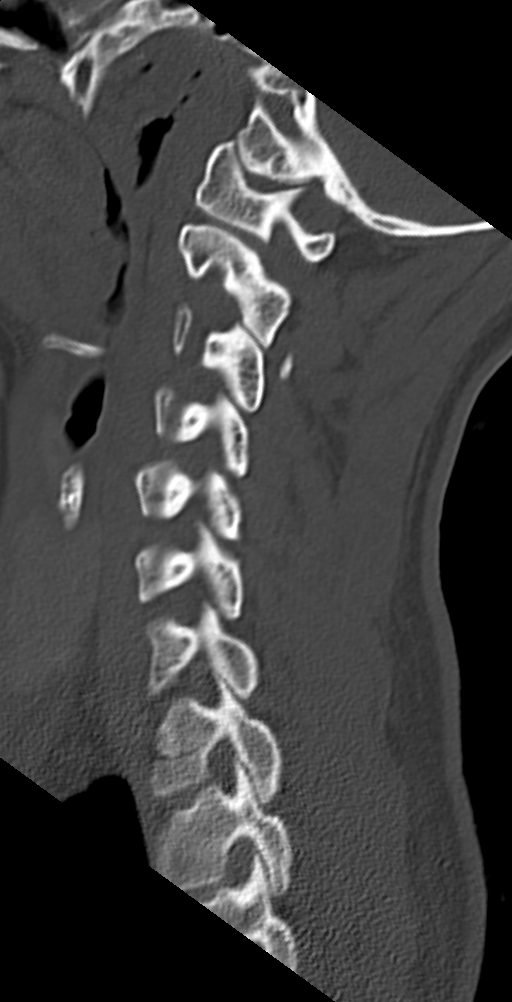
[im 22/52  bone]
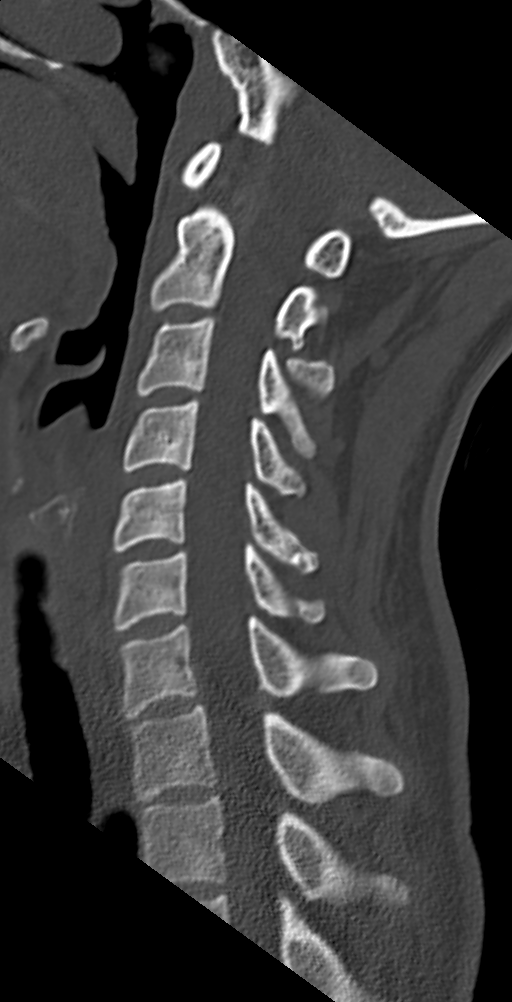
[im 26/52  soft-tissue]
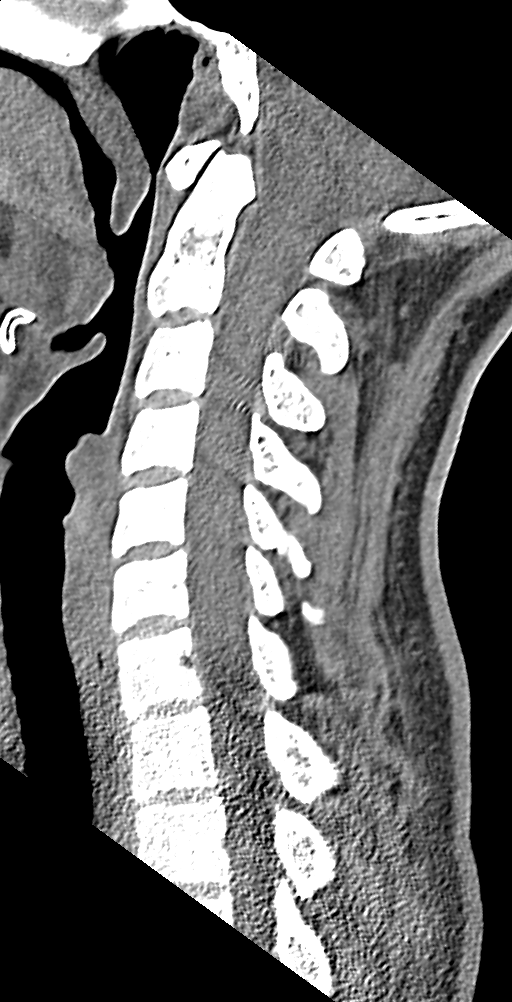
[im 26/52  bone]
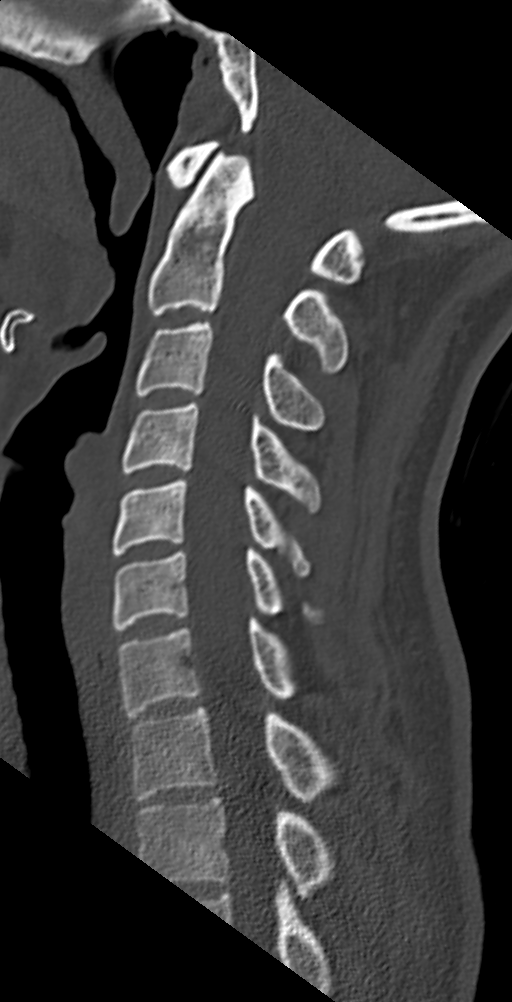
[im 30/52  bone]
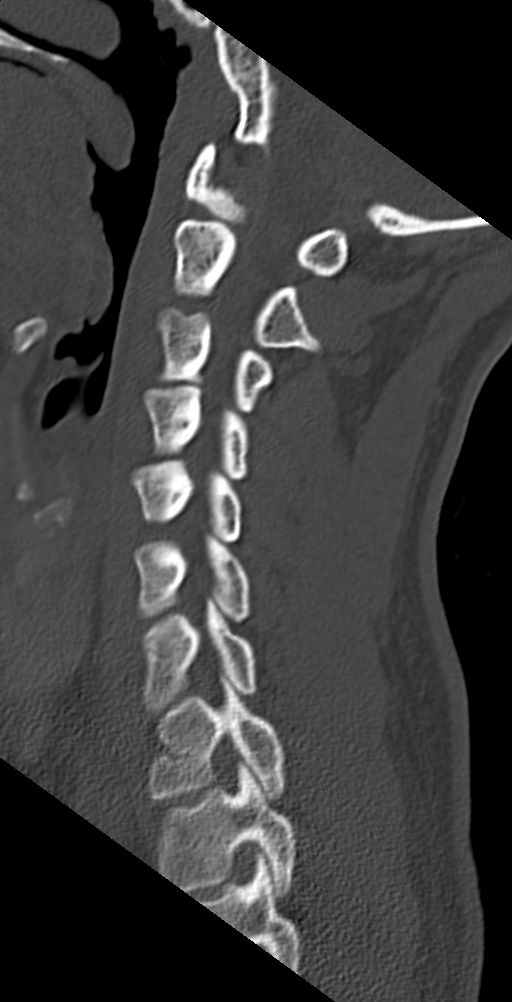
[im 35/52  bone]
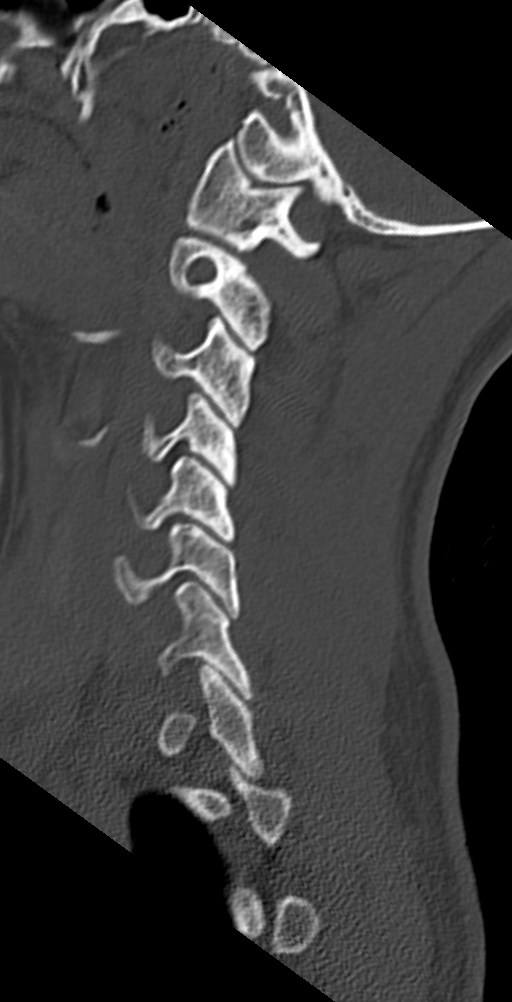

[Series 6: coronal bone · coronal · 0.23mm/px · 3 of 60 slices shown]
[im 14/60  bone]
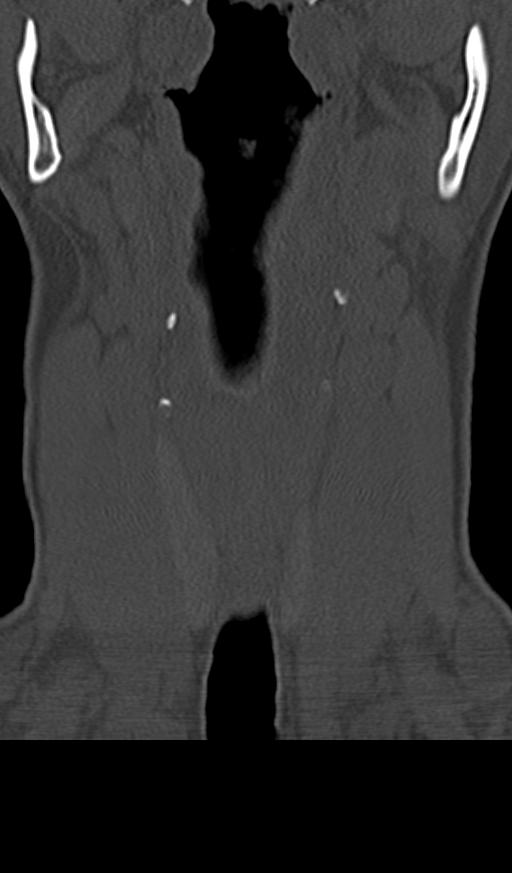
[im 25/60  bone]
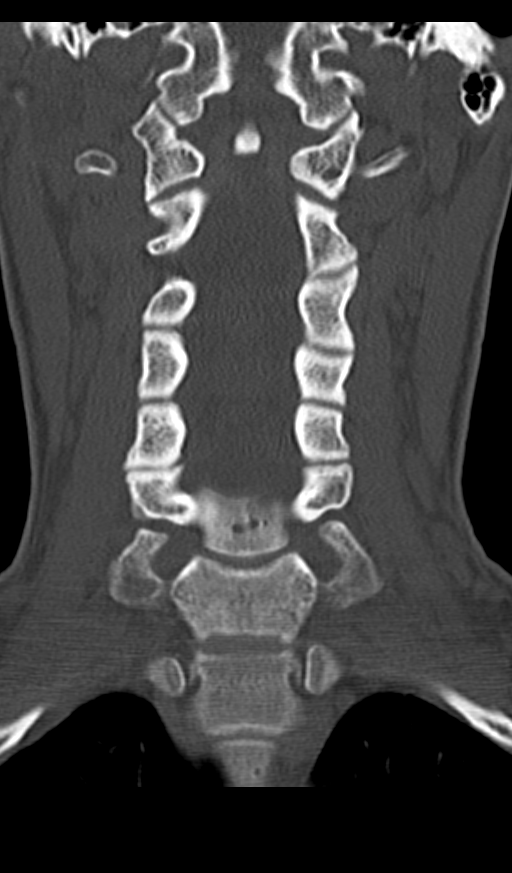
[im 36/60  bone]
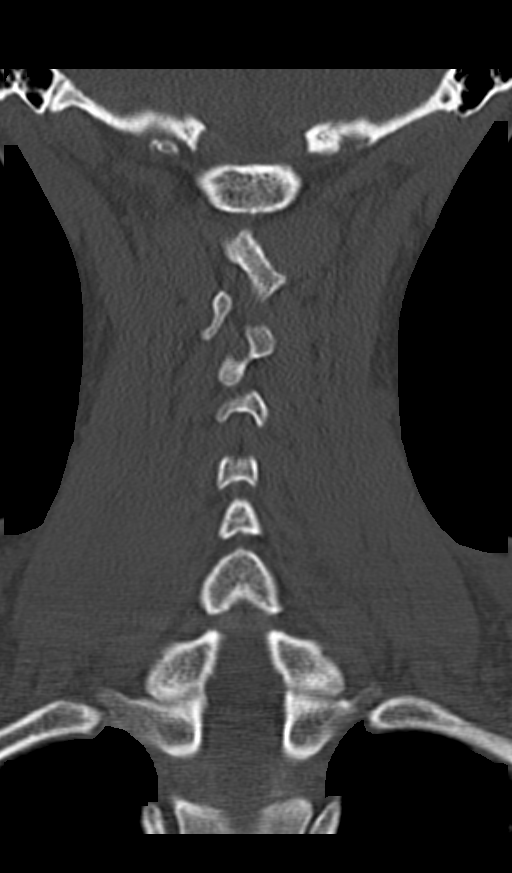

[Series 7: axial · axial · 0.24mm/px · z∈[-240,-172]mm · 2 of 96 slices shown, 3 images]
[im 28/96  soft-tissue]
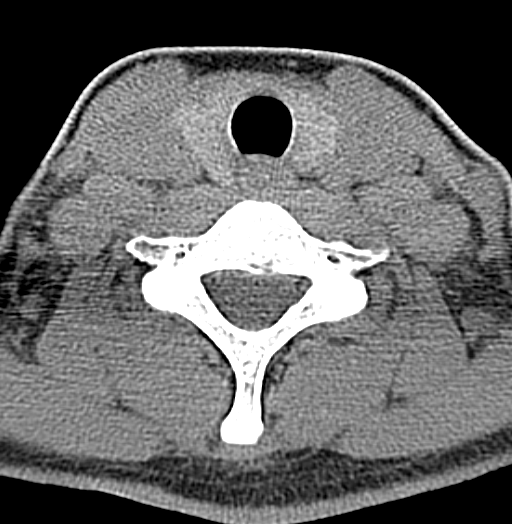
[im 28/96  bone]
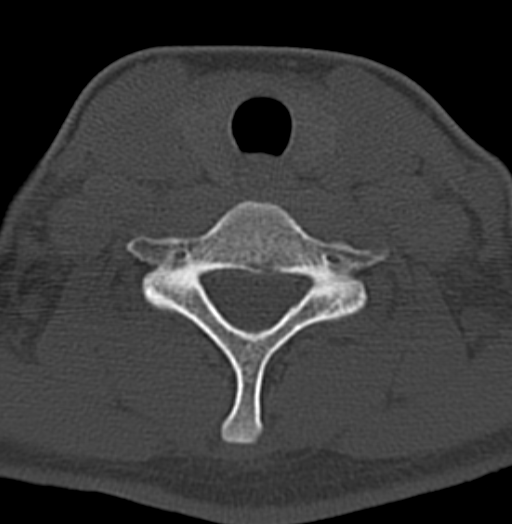
[im 68/96  bone]
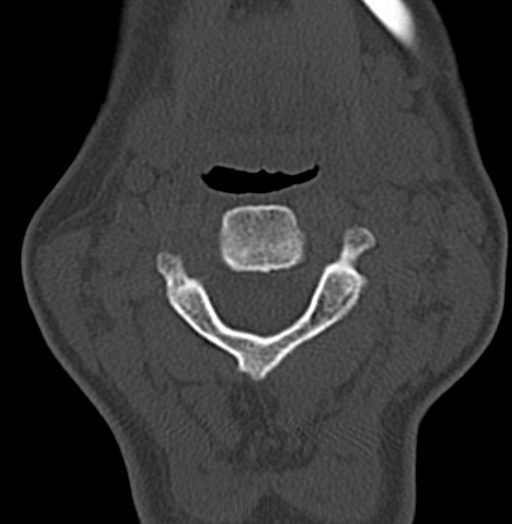

[10 of 33 positions shown; findings below may reference images not displayed]

FINDINGS: No acute fracture, malalignment or prevertebral soft tissue
swelling. Unremarkable CT appearance of the thyroid gland. No acute
soft tissue abnormality. The lung apices are unremarkable.
IMPRESSION: No acute fracture or malalignment.

## 2016-06-16 IMAGING — CT CT HEAD W/O CM
3 of 6 series · 15 of 47 positions shown, 18 images · non-contrast
Comparison: None.

CLINICAL DATA: Fall with pain.  Initial encounter.

EXAM:
CT HEAD WITHOUT CONTRAST
CT MAXILLOFACIAL WITHOUT CONTRAST
TECHNIQUE: Multidetector CT imaging of the head and maxillofacial structures
were performed using the standard protocol without intravenous
contrast. Multiplanar CT image reconstructions of the maxillofacial
structures were also generated.

[Series 4: max soft · axial · 0.29mm/px · z∈[-269,-123]mm · 10 of 87 slices shown, 13 images]
[im 9/87  brain]
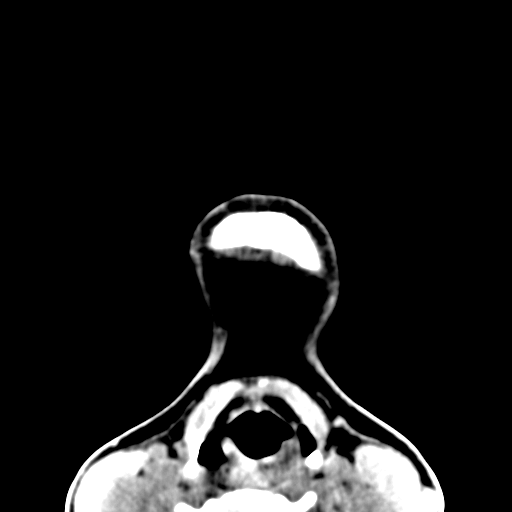
[im 9/87  bone]
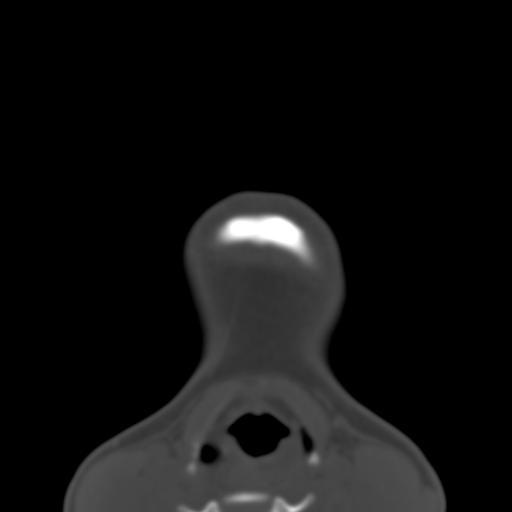
[im 17/87  brain]
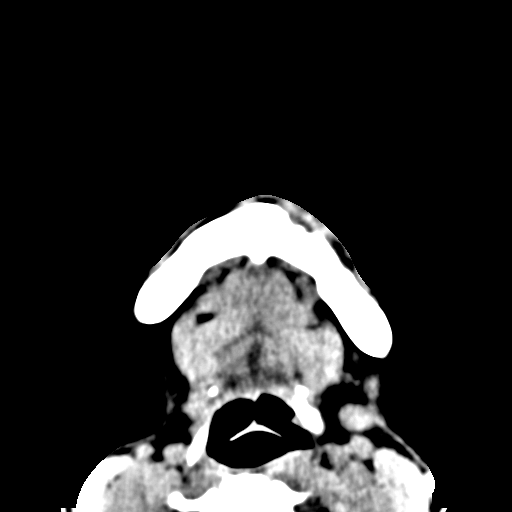
[im 25/87  brain]
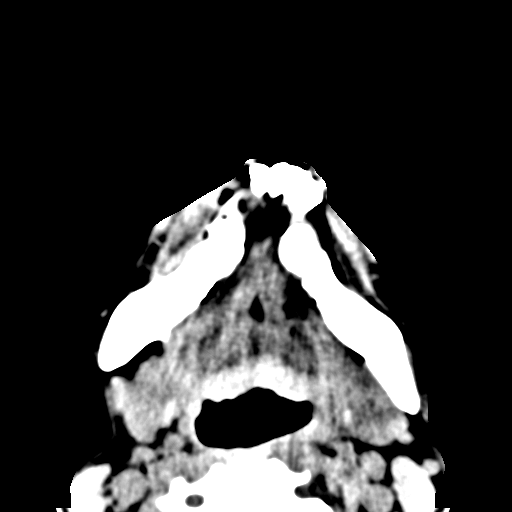
[im 33/87  brain]
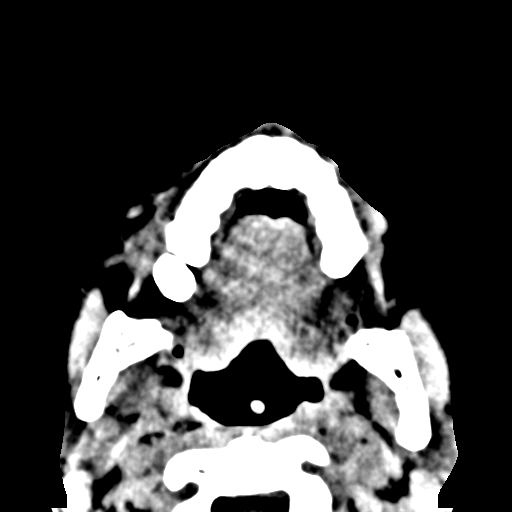
[im 41/87  brain]
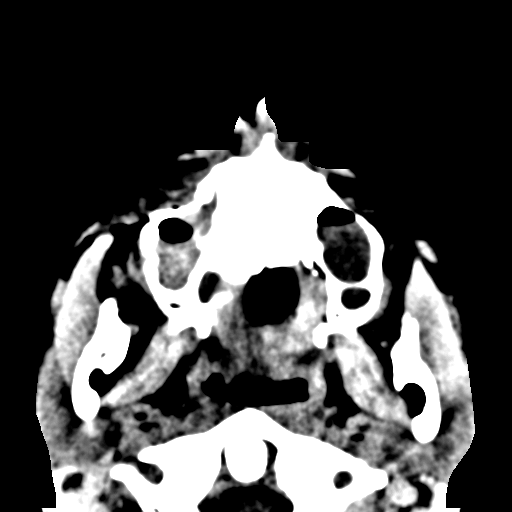
[im 41/87  bone]
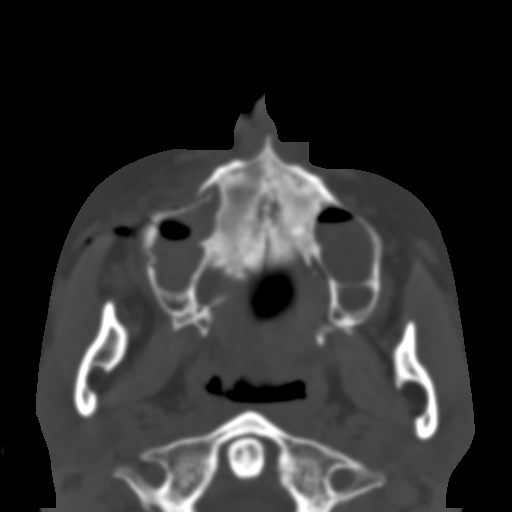
[im 50/87  brain]
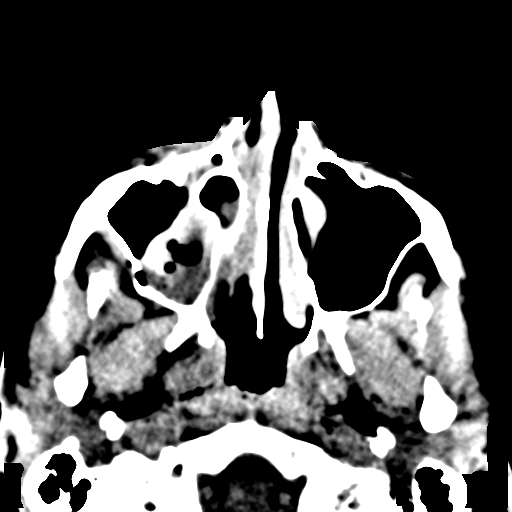
[im 58/87  brain]
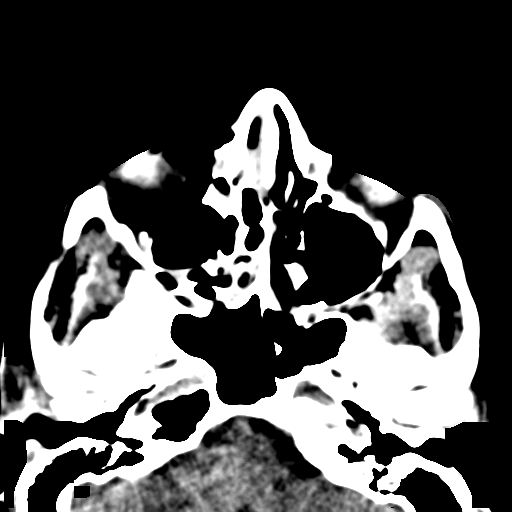
[im 66/87  brain]
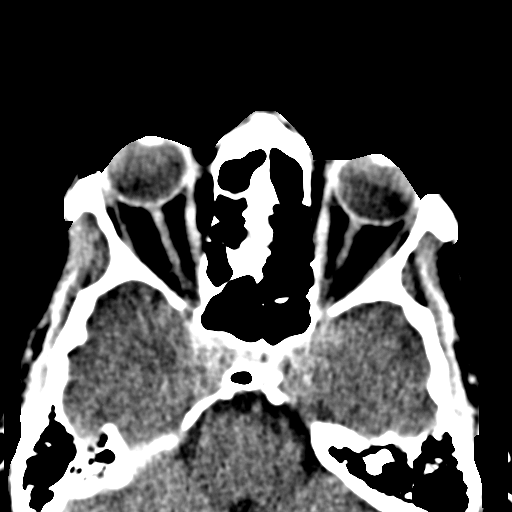
[im 74/87  brain]
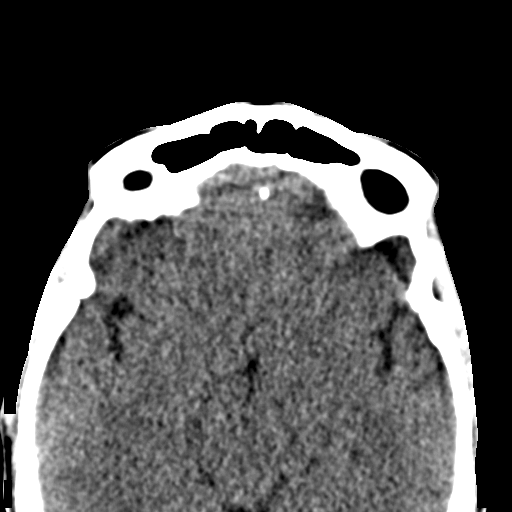
[im 74/87  bone]
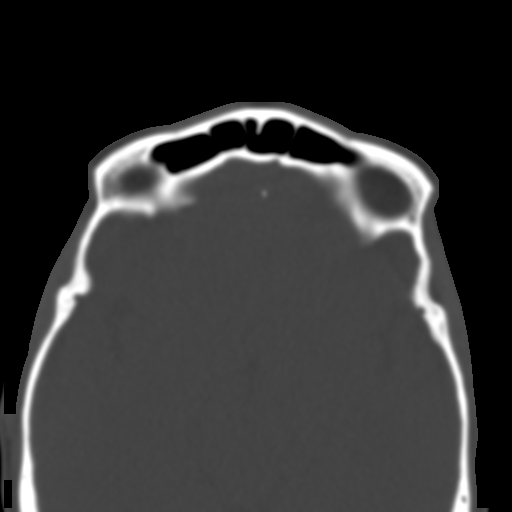
[im 82/87  brain]
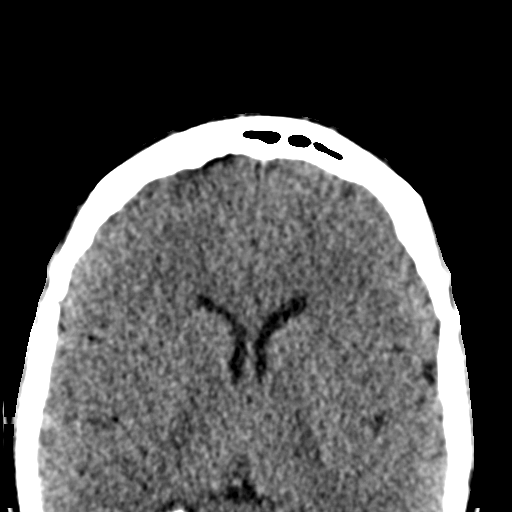

[Series 6: coronal soft tissue · coronal · 0.30mm/px · 3 of 63 slices shown]
[im 16/63  brain]
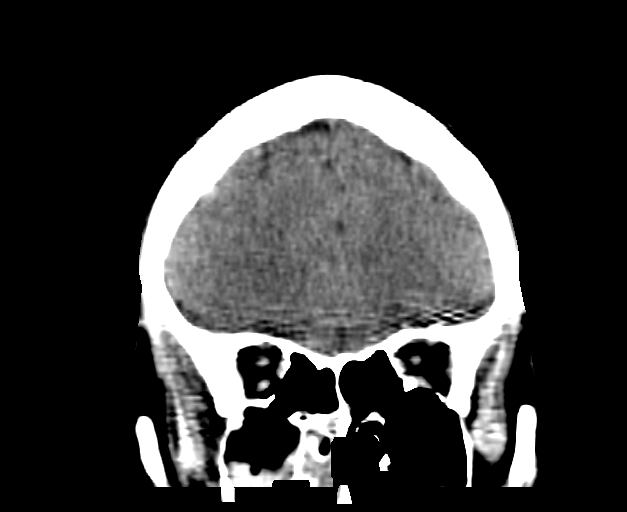
[im 32/63  brain]
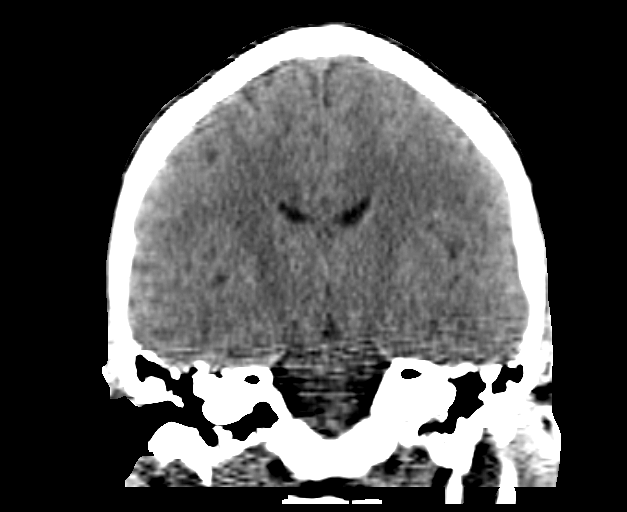
[im 47/63  brain]
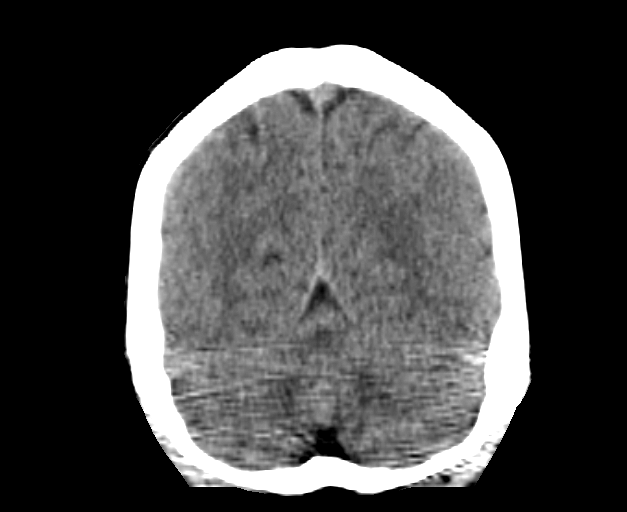

[Series 9: sagittal soft · sagittal · 0.32mm/px · 2 of 73 slices shown]
[im 25/73  brain]
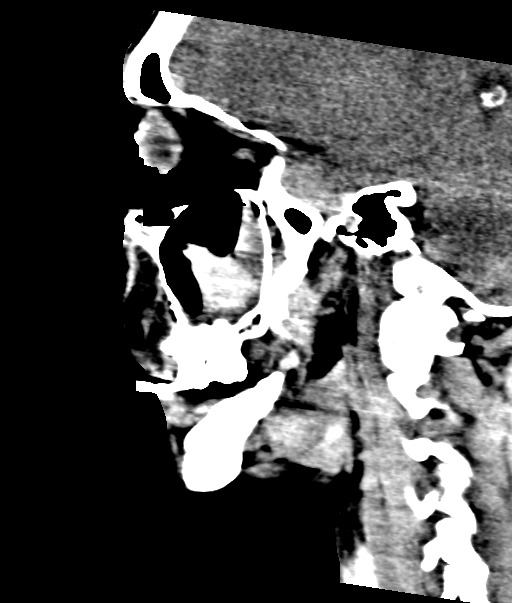
[im 49/73  brain]
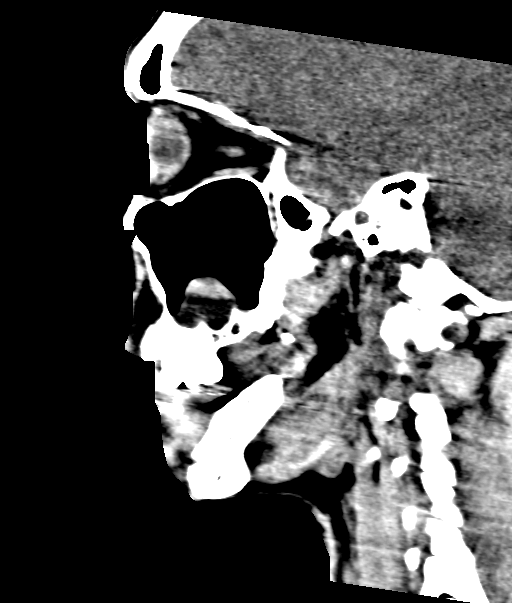

[15 of 47 positions shown; findings below may reference images not displayed]

FINDINGS: CT HEAD FINDINGS

Brain: Negative. No evidence of acute infarction, hemorrhage,
hydrocephalus, or mass lesion/mass effect.

Vascular: No hyperdense vessel or unexpected calcification.

Skull: Negative for fracture or focal lesion.

Sinuses/Orbits: See below

Other: None.

CT MAXILLOFACIAL FINDINGS

Incomplete tripod fracture on the right, sparing the lateral orbital
rim and zygomatic arch. There are fractures through the right
orbital floor, right lateral wall orbit, and right lateral maxillary
sinus wall crossing into the nasal cavity anteriorly. No noted
depression or face asymmetry. No ethmoid continuation. No pterygoid
process involvement. Emphysema in the right orbit, extraconal,
tracking into the lids. There is mild right proptosis. No discrete
hemorrhage in the right orbit. No herniation or rounding of
extraocular muscles. Located mandible. Right maxillary hemo sinus.

These results were called by telephone at the time of interpretation
on [DATE] at [DATE] to Dr. CHUANG , who verbally
acknowledged these results.
IMPRESSION: 1. Incomplete zygomaticomaxillary complex fractures on the right.
Right orbital emphysema with mild proptosis. No noted facial
asymmetry.
2. No evidence of intracranial injury.

## 2016-06-16 IMAGING — CR DG SHOULDER 2+V*R*
1 series · 4 of 4 positions shown · non-contrast
Comparison: None.

CLINICAL DATA: Initial encounter for Pain after playing bball and
falling [DO] ago

EXAM:
RIGHT SHOULDER - 2+ VIEW

[Series 1: dg shoulder right · 0.14mm/px · 4 of 4 slices shown]
[im 1/4]
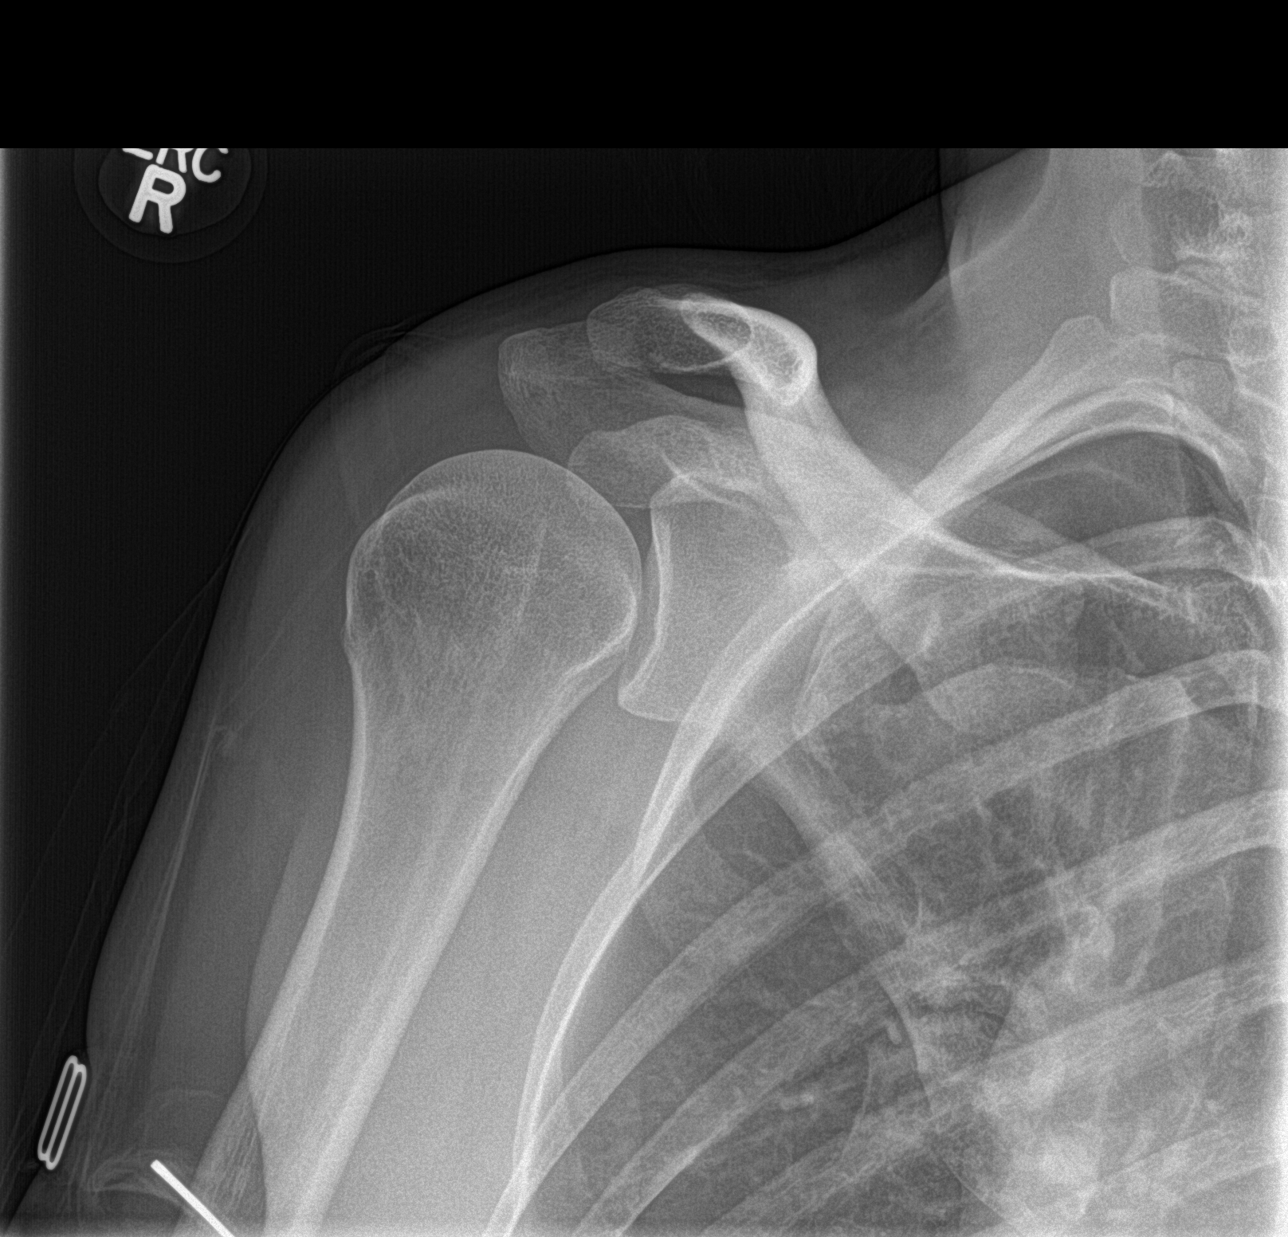
[im 2/4]
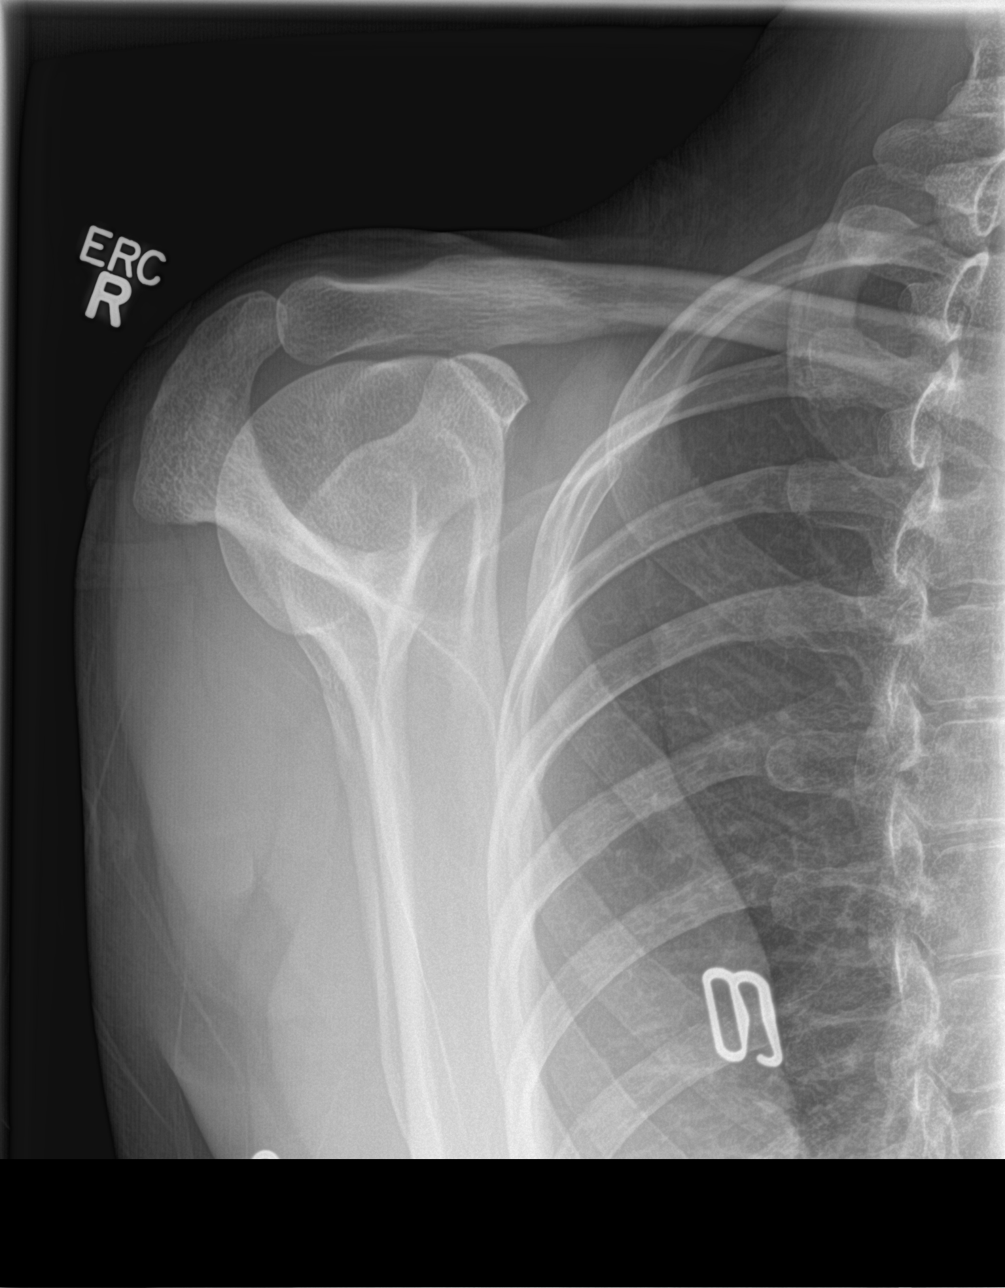
[im 3/4]
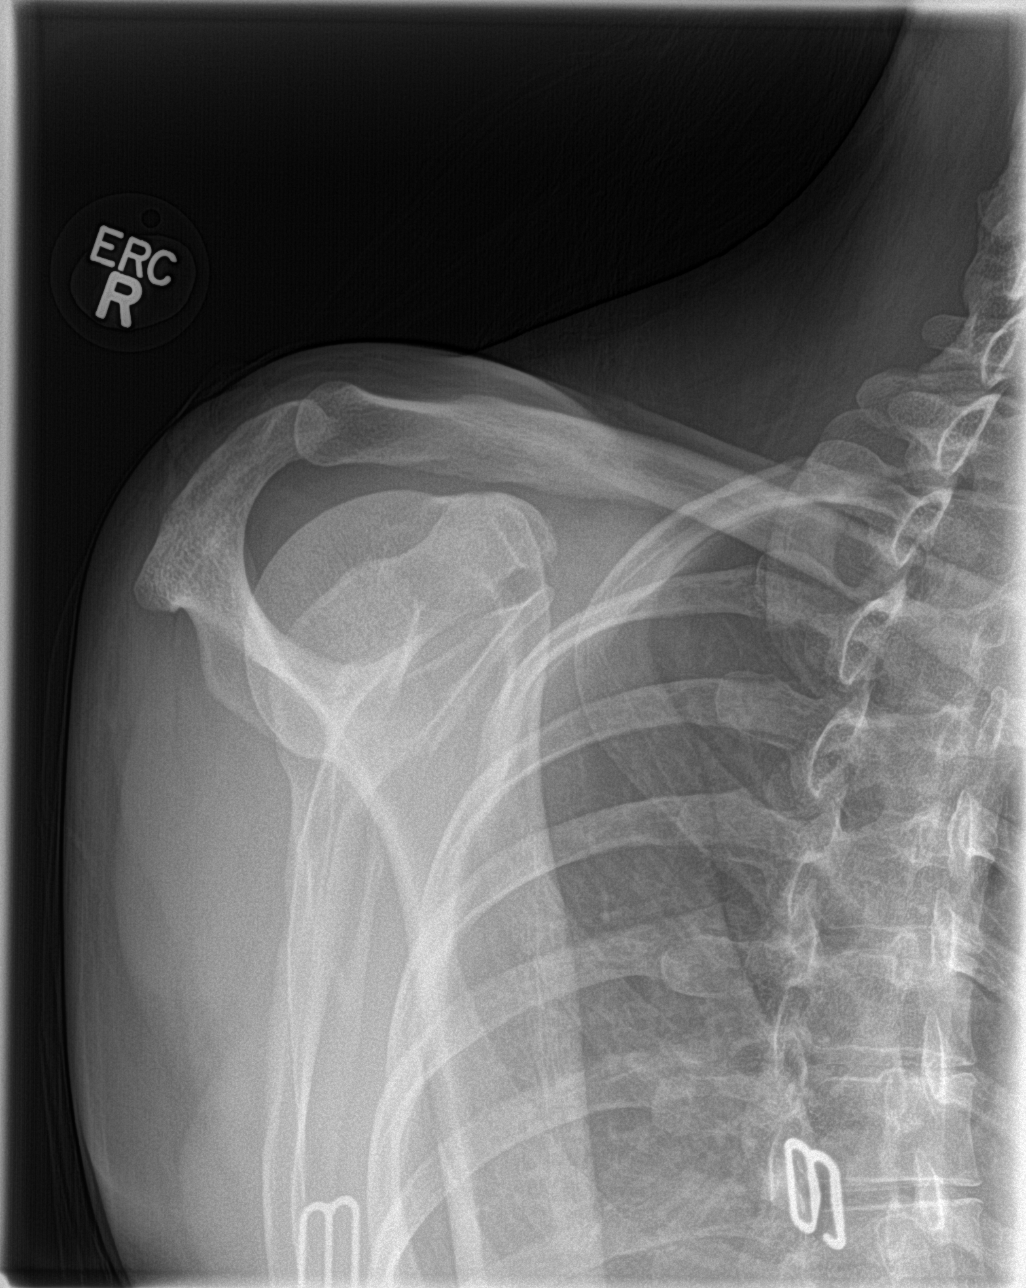
[im 4/4]
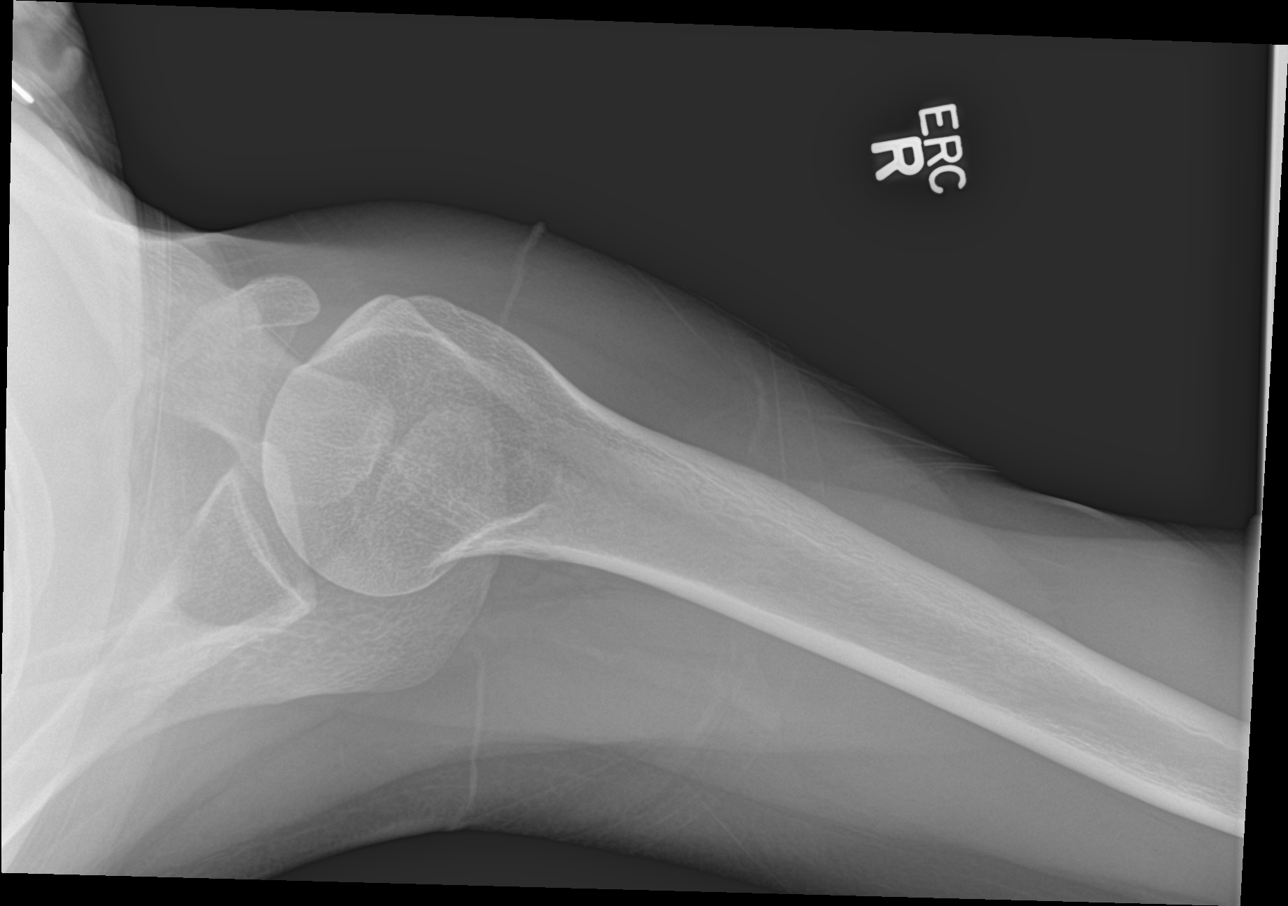

[4 of 4 positions shown; findings below may reference images not displayed]

FINDINGS: Visualized portion of the right hemithorax is normal. No acute
fracture or dislocation.
IMPRESSION: No acute osseous abnormality.

## 2016-06-16 MED ORDER — MORPHINE SULFATE (PF) 4 MG/ML IV SOLN
INTRAVENOUS | Status: AC
  Filled 2016-06-16: qty 1

## 2016-06-16 MED ORDER — SODIUM CHLORIDE 0.9 % IV BOLUS (SEPSIS)
1000.0000 mL | Freq: Once | INTRAVENOUS | Status: AC
  Administered 2016-06-16: 1000 mL via INTRAVENOUS

## 2016-06-16 MED ORDER — METOCLOPRAMIDE HCL 5 MG/ML IJ SOLN
10.0000 mg | Freq: Once | INTRAMUSCULAR | Status: AC
  Administered 2016-06-16: 10 mg via INTRAVENOUS

## 2016-06-16 MED ORDER — AMPICILLIN-SULBACTAM SODIUM 3 (2-1) G IJ SOLR
3.0000 g | Freq: Once | INTRAMUSCULAR | Status: AC
  Administered 2016-06-16: 3 g via INTRAVENOUS
  Filled 2016-06-16: qty 3

## 2016-06-16 MED ORDER — OXYCODONE-ACETAMINOPHEN 5-325 MG PO TABS
ORAL_TABLET | ORAL | Status: AC
  Filled 2016-06-16: qty 1

## 2016-06-16 MED ORDER — HYDROMORPHONE HCL 1 MG/ML IJ SOLN
INTRAMUSCULAR | Status: AC
  Filled 2016-06-16: qty 1

## 2016-06-16 MED ORDER — ONDANSETRON HCL 4 MG/2ML IJ SOLN
INTRAMUSCULAR | Status: AC
  Filled 2016-06-16: qty 2

## 2016-06-16 MED ORDER — OXYCODONE-ACETAMINOPHEN 5-325 MG PO TABS
1.0000 | ORAL_TABLET | Freq: Once | ORAL | Status: AC
Start: 2016-06-16 — End: 2016-06-16
  Administered 2016-06-16: 1 via ORAL

## 2016-06-16 MED ORDER — METOCLOPRAMIDE HCL 5 MG/ML IJ SOLN
INTRAMUSCULAR | Status: AC
  Administered 2016-06-16: 10 mg via INTRAVENOUS
  Filled 2016-06-16: qty 2

## 2016-06-16 MED ORDER — HYDROMORPHONE HCL 1 MG/ML IJ SOLN
1.0000 mg | Freq: Once | INTRAMUSCULAR | Status: AC
  Administered 2016-06-16: 1 mg via INTRAVENOUS
  Filled 2016-06-16: qty 1

## 2016-06-16 NOTE — ED Notes (Signed)
Patient tripped and fell. Patient with pain and swelling to right eye. Patient also reports some bleeding from right nare. Patient denies LOC.

## 2016-06-16 NOTE — ED Notes (Signed)
Pt reports decrease in nausea after reglan administration
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
## 2017-02-03 ENCOUNTER — Ambulatory Visit (INDEPENDENT_AMBULATORY_CARE_PROVIDER_SITE_OTHER): Payer: Medicaid Other | Admitting: Obstetrics and Gynecology

## 2017-02-03 ENCOUNTER — Encounter: Payer: Self-pay | Admitting: Obstetrics and Gynecology

## 2017-02-03 VITALS — BP 114/76 | Ht 70.0 in | Wt 152.0 lb

## 2017-02-03 DIAGNOSIS — Z124 Encounter for screening for malignant neoplasm of cervix: Secondary | ICD-10-CM

## 2017-02-03 DIAGNOSIS — N6452 Nipple discharge: Secondary | ICD-10-CM

## 2017-02-03 DIAGNOSIS — Z01419 Encounter for gynecological examination (general) (routine) without abnormal findings: Secondary | ICD-10-CM

## 2017-02-03 DIAGNOSIS — N6312 Unspecified lump in the right breast, upper inner quadrant: Secondary | ICD-10-CM | POA: Diagnosis not present

## 2017-02-03 DIAGNOSIS — Z113 Encounter for screening for infections with a predominantly sexual mode of transmission: Secondary | ICD-10-CM

## 2017-02-03 LAB — HM PAP SMEAR

## 2017-02-03 NOTE — Progress Notes (Addendum)
Gynecology Annual Exam  PCP: No PCP Per Patient  Chief Complaint  Patient presents with  . Annual Exam  . Breast Pain    lump in right breast   History of Present Illness:  [name] is a 36 y.o. [id number] who is amenorrheic since her ablation last year, presents today for her annual examination.  Her menses are absent  She is sexually active without issues.  Last Pap: 3 years ago and was normal.  Last mammogram: n/a. There is a history of fibrocystic breast tissue that has been evaluated years ago by ultrasound. There is no FH of breast cancer. There is no FH of ovarian cancer. The patient does do self-breast exams.  RIght breast lump x  1 month.  Blue-green discharge from nipple. Left breast with brown/red discharge x 1 month, also.  No prior imaging.  First time this has happened.  No visual, neuro sx.   Review of Systems: Review of Systems  Constitutional: Negative.   HENT: Negative.   Eyes: Negative for blurred vision and double vision.  Respiratory: Negative.   Cardiovascular: Negative.   Gastrointestinal: Negative.   Genitourinary: Negative.   Musculoskeletal: Negative.   Skin: Negative.   Neurological: Negative.   Endo/Heme/Allergies: Negative.   Psychiatric/Behavioral: The patient is nervous/anxious.   lump noted in right breast with discharge and left nipple with discharge  Past Medical History:  Diagnosis Date  . Anemia   . Anxiety   . Headache    h/o migraines  . Ovarian cyst   . PTSD (post-traumatic stress disorder)   . Thrombocytopenia ([place])     Past Surgical History:  Procedure Laterality Date  . DILITATION & CURRETTAGE/HYSTROSCOPY WITH NOVASURE ABLATION  12/07/2015   Procedure: DILATATION & CURETTAGE/HYSTEROSCOPY WITH NOVASURE ABLATION;  Surgeon: Will Bonnet, MD;  Location: [facility] ORS;  Service: Gynecology;;  . MOUTH SURGERY    . TONSILLECTOMY    . TUBAL LIGATION      Medications:   Medication Sig Start Date End Date Taking?  Authorizing Provider  BIOTIN PO Take 1 tablet by mouth daily.   Yes Historical Provider, MD  Desvenlafaxine ER (PRISTIQ) 50 MG TB24 Take by mouth daily.   Yes Historical Provider, MD  Cholecalciferol (VITAMIN D3) 5000 units TABS Take 1 tablet by mouth once a week.    Historical Provider, MD  doxycycline (VIBRA-TABS) 100 MG tablet Take 1 tablet (100 mg total) by mouth 2 (two) times daily. Patient not taking: Reported on 02/03/2017 12/13/15   Loney Hering, MD  ferrous sulfate 325 (65 FE) MG tablet Take 325 mg by mouth daily with breakfast.    Historical Provider, MD  HYDROcodone-acetaminophen (NORCO) 5-325 MG tablet Take 1 tablet by mouth every 6 (six) hours as needed for moderate pain. Patient not taking: Reported on 02/03/2017 12/07/15   Will Bonnet, MD  ibuprofen (ADVIL,MOTRIN) 600 MG tablet Take 1 tablet (600 mg total) by mouth every 6 (six) hours as needed for mild pain or cramping. Patient not taking: Reported on 02/03/2017 12/07/15   Will Bonnet, MD  metroNIDAZOLE (FLAGYL) 500 MG tablet Take 1 tablet (500 mg total) by mouth 2 (two) times daily. Patient not taking: Reported on 02/03/2017 12/13/15   Loney Hering, MD  oxyCODONE-acetaminophen (ROXICET) 5-325 MG tablet Take 1 tablet by mouth every 6 (six) hours as needed. Patient not taking: Reported on 02/03/2017 12/13/15   Loney Hering, MD    Allergies: No Known Allergies  Gynecologic History: No  LMP recorded. Patient has had an ablation.  Obstetric History: [id number]  Social History   Social History  . Marital status: Single    Spouse name: N/A  . Number of children: N/A  . Years of education: N/A   Occupational History  . Not on file.   Social History Main Topics  . Smoking status: Current Every Day Smoker    Packs/day: 1.00    Years: 13.00    Types: Cigarettes  . Smokeless tobacco: Never Used  . Alcohol use Yes     Comment: socially  . Drug use: No  . Sexual activity: Yes   Other Topics Concern  . Not  on file   Social History Narrative  . No narrative on file   Family History  Problem Relation Age of Onset  . Breast cancer Paternal Aunt   . Breast cancer Paternal Grandmother      Physical Exam BP 114/76   Ht 5\' 10"  (1.778 m)   Wt 152 lb (68.9 kg)   BMI 21.81 kg/m    Physical Exam  Constitutional: She is oriented to person, place, and time and well-developed, well-nourished, and in no distress. No distress.  HENT:  Head: Normocephalic and atraumatic.  Eyes: Conjunctivae and EOM are normal. Right eye exhibits no discharge. Left eye exhibits no discharge.  Neck: Normal range of motion. Neck supple. No tracheal deviation present. No thyromegaly present.  Cardiovascular: Normal rate, regular rhythm, normal heart sounds and intact distal pulses.  Exam reveals no gallop and no friction rub.   No murmur heard. Pulmonary/Chest: Effort normal and breath sounds normal. No respiratory distress. She has no wheezes. She has no rales. Right breast exhibits nipple discharge (dark green, black (expressed by patient)) and tenderness (mild ttp over right nipple). Right breast exhibits no inverted nipple, no mass and no skin change. Left breast exhibits nipple discharge (clear when expressed by patient). Left breast exhibits no inverted nipple and no mass. Breasts are symmetrical.    Dense fibroglandular tissue noted throughout both breasts.  Nipple discharge (as noted previously when expressed by patient.   Abdominal: Soft. Bowel sounds are normal. She exhibits no distension and no mass. There is no tenderness. There is no rebound and no guarding.  Genitourinary: Vagina normal, uterus normal, cervix normal, right adnexa normal, left adnexa normal and vulva normal. Uterus is not deviated, not enlarged, not fixed and not tender.  Cervix is not fixed. Cervix exhibits no motion tenderness, no lesion and no tenderness. Right adnexum displays no deviation, no mass and no tenderness. Left adnexum displays  no deviation, no mass and no tenderness.  Musculoskeletal: Normal range of motion. She exhibits no edema, tenderness or deformity.  Lymphadenopathy:    She has no cervical adenopathy.  Neurological: She is alert and oriented to person, place, and time.  Skin: Skin is warm and dry. No rash noted. No erythema. No pallor.  Psychiatric: Affect normal.  Appears nervous with trembling of hands and occasional tearfullness when discussing breast issues.   Female chaperone present for pelvic and breast  portions of the physical exam  Results: PHQ-9: 4 Audit questionnaire: 4  Assessment: 36 y.o. [id number] here for routine annual with bilateral nipple discharge and right breast lump.   Plan: Problem List Items Addressed This Visit    Breast lump on right side at 1 o'clock position   Relevant Orders   US BREAST LTD UNI RIGHT INC AXILLA - STAT Bilateral breast mammography, diagnostic with tomosynthesis US BREAST COMPLETE  UNI LEFT INC AXILLA - STAT    Other Visit Diagnoses    Women's annual routine gynecological examination    -  Primary   Pap smear for cervical cancer screening       Relevant Orders   IGP,CtNg,AptimaHPV,rfx16/18,45   Screen for STD (sexually transmitted disease)       Relevant Orders   IGP,CtNg,AptimaHPV,rfx16/18,45   Nipple discharge in female       Relevant Orders   Prolactin     20 minutes spent in face to face discussion with > 50% spent in counseling and management of her nipple discharge and breast mass.   Prentice Docker, MD 02/04/2017 9:19 AM

## 2017-02-04 ENCOUNTER — Telehealth: Payer: Self-pay

## 2017-02-04 ENCOUNTER — Ambulatory Visit
Admission: RE | Admit: 2017-02-04 | Discharge: 2017-02-04 | Disposition: A | Discharge status: Home / Self Care | Payer: Medicaid Other | Source: Ambulatory Visit | Attending: Obstetrics and Gynecology | Admitting: Obstetrics and Gynecology

## 2017-02-04 ENCOUNTER — Telehealth: Payer: Self-pay | Admitting: Obstetrics and Gynecology

## 2017-02-04 DIAGNOSIS — N6452 Nipple discharge: Secondary | ICD-10-CM

## 2017-02-04 DIAGNOSIS — N6312 Unspecified lump in the right breast, upper inner quadrant: Secondary | ICD-10-CM

## 2017-02-04 NOTE — Addendum Note (Signed)
Addended by: Prentice Docker D on: 02/04/2017 10:28 AM   Modules accepted: Orders

## 2017-02-04 NOTE — Telephone Encounter (Signed)
-----   Message from Will Bonnet, MD sent at 02/03/2017  3:15 PM EDT ----- Please call patient and have her come in first thing in the morning for a prolactin measurement.  Thank you!

## 2017-02-04 NOTE — Telephone Encounter (Signed)
Patient is aware of appointment today at [facility], [address] [place], [place]. Patient is aware of 2pm check-in for 2:20pm appointment.

## 2017-02-04 NOTE — Addendum Note (Signed)
Addended by: Prentice Docker D on: 02/04/2017 09:20 AM   Modules accepted: Orders

## 2017-02-04 NOTE — Addendum Note (Signed)
Addended by: Prentice Docker D on: 02/04/2017 09:26 AM   Modules accepted: Orders

## 2017-02-07 LAB — IGP,CTNG,APTIMAHPV,RFX16/18,45
Chlamydia, Nuc. Acid Amp: NEGATIVE
GONOCOCCUS BY NUCLEIC ACID AMP: NEGATIVE
HPV Aptima: POSITIVE — AB
PAP Smear Comment: 0

## 2017-02-11 ENCOUNTER — Telehealth: Payer: Self-pay | Admitting: Obstetrics and Gynecology

## 2017-02-11 ENCOUNTER — Telehealth: Payer: Self-pay

## 2017-02-11 NOTE — Telephone Encounter (Signed)
Pt called stating she missed a call from [facility] regarding her last visit.  Please call back.  [phone].

## 2017-02-11 NOTE — Telephone Encounter (Signed)
Called pt to inform her of results and recommendation for colposcopy.  Left generic VM for patient to call me back so that I could review with her, in depth, the results.

## 2017-02-12 NOTE — Telephone Encounter (Signed)
Routing to [employer]

## 2017-02-12 NOTE — Telephone Encounter (Signed)
Left generic vm 

## 2017-02-13 ENCOUNTER — Encounter: Payer: Self-pay | Admitting: Obstetrics and Gynecology

## 2017-02-13 NOTE — Telephone Encounter (Signed)
Yes , on 03/14/17 with Dr. Glennon Mac

## 2017-02-13 NOTE — Telephone Encounter (Signed)
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  Recommend colposcopy. SHe voiced understanding of the importance of following up on this result. She will call to arrange colposcopy as soon as she can get one.

## 2017-02-13 NOTE — Telephone Encounter (Signed)
Is this pt scheduled for colpo?

## 2017-02-13 NOTE — Telephone Encounter (Signed)
Left generic VM 

## 2017-02-13 NOTE — Telephone Encounter (Signed)
Pt is returning call. Please call Back. [phone]

## 2017-02-14 ENCOUNTER — Telehealth: Payer: Self-pay | Admitting: Obstetrics and Gynecology

## 2017-02-14 NOTE — Telephone Encounter (Signed)
Patient would like a call from Dr. Glennon Mac.
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
## 2017-02-18 ENCOUNTER — Encounter: Payer: Self-pay | Admitting: Obstetrics and Gynecology

## 2017-02-18 DIAGNOSIS — Z803 Family history of malignant neoplasm of breast: Secondary | ICD-10-CM

## 2017-03-14 ENCOUNTER — Ambulatory Visit: Payer: Medicaid Other | Admitting: Obstetrics and Gynecology

## 2017-03-14 ENCOUNTER — Ambulatory Visit (INDEPENDENT_AMBULATORY_CARE_PROVIDER_SITE_OTHER): Payer: Medicaid Other | Admitting: Obstetrics and Gynecology

## 2017-03-14 VITALS — BP 114/78 | Ht 70.0 in | Wt 154.0 lb

## 2017-03-14 DIAGNOSIS — R87612 Low grade squamous intraepithelial lesion on cytologic smear of cervix (LGSIL): Secondary | ICD-10-CM | POA: Diagnosis not present

## 2017-03-14 NOTE — Progress Notes (Signed)
HPI:  Jessica Peters is a 36 y.o.  [id number]  who presents today for evaluation and management of abnormal cervical cytology.    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 Anemia   . Anxiety   . Family history of breast cancer    My Risk cancer genetic testing letter sent 3/18  . Headache    h/o migraines  . Ovarian cyst   . PTSD (post-traumatic stress disorder)   . Thrombocytopenia ([place])     Past Surgical History:  Procedure Laterality Date  . DILITATION & CURRETTAGE/HYSTROSCOPY WITH NOVASURE ABLATION  12/07/2015   Procedure: DILATATION & CURETTAGE/HYSTEROSCOPY WITH NOVASURE ABLATION;  Surgeon: Will Bonnet, MD;  Location: [facility] ORS;  Service: Gynecology;;  . MOUTH SURGERY    . TONSILLECTOMY    . TUBAL LIGATION      SOCIAL HISTORY:  History  Alcohol Use  . Yes    Comment: socially    History  Drug Use No     Family History  Problem Relation Age of Onset  . Breast cancer Paternal Aunt 13  . Breast cancer Paternal Grandmother 69    ALLERGIES:  Patient has no known allergies.  Current Outpatient Prescriptions on File Prior to Visit  Medication Sig Dispense Refill  . BIOTIN PO Take 1 tablet by mouth daily.    . Cholecalciferol (VITAMIN D3) 5000 units TABS Take 1 tablet by mouth once a week.    Marland Kitchen Desvenlafaxine ER (PRISTIQ) 50 MG TB24 Take by mouth daily.    Marland Kitchen doxycycline (VIBRA-TABS) 100 MG tablet Take 1 tablet (100 mg total) by mouth 2 (two) times daily. (Patient not taking: Reported on 02/03/2017) 14 tablet 0  . ferrous sulfate 325 (65 FE) MG tablet Take 325 mg by mouth daily with breakfast.    . HYDROcodone-acetaminophen (NORCO) 5-325 MG tablet Take 1 tablet by  mouth every 6 (six) hours as needed for moderate pain. (Patient not taking: Reported on 02/03/2017) 30 tablet 0  . ibuprofen (ADVIL,MOTRIN) 600 MG tablet Take 1 tablet (600 mg total) by mouth every 6 (six) hours as needed for mild pain or cramping. (Patient not taking: Reported on 02/03/2017) 30 tablet 0  . metroNIDAZOLE (FLAGYL) 500 MG tablet Take 1 tablet (500 mg total) by mouth 2 (two) times daily. (Patient not taking: Reported on 02/03/2017) 14 tablet 0  . oxyCODONE-acetaminophen (ROXICET) 5-325 MG tablet Take 1 tablet by mouth every 6 (six) hours as needed. (Patient not taking: Reported on 02/03/2017) 12 tablet 0   No current facility-administered medications on file prior to visit.     Physical Exam: -Vitals:  BP 114/78   Ht 5\' 10"  (1.778 m)   Wt 154 lb (69.9 kg)   BMI 22.10 kg/m  GEN: WD, WN, NAD.  A+ O x 3, good mood and affect. ABD:  NT, ND.  Soft, no masses.  No hernias noted.   Pelvic:   Vulva: Normal appearance.  No  lesions.  Vagina: No lesions or abnormalities noted.  Support: Normal pelvic support.  Urethra No masses tenderness or scarring.  Meatus Normal size without lesions or prolapse.  Cervix: See below.  Anus: Normal exam.  No lesions.  Perineum: Normal exam.  No lesions.        Bimanual   Uterus: Normal size.  Non-tender.  Mobile.  AV.  Adnexae: No masses.  Non-tender to palpation.  Cul-de-sac: Negative for abnormality.   PROCEDURE: 1.  Colposcopy performed with 4% acetic acid after verbal consent obtained                                         -Aceto-white Lesions Location(s): 1, 4-5, 8-9 o'clock.              -Biopsy performed at 1, 4, and 8 o'clock               -ECC indicated and performed: Yes.       -Biopsy sites made hemostatic with pressure, AgNO3, and/or Monsel's solution   -Satisfactory colposcopy: No.    -Evidence of Invasive cervical CA :  NO  ASSESSMENT:  Jessica Peters is a 36 y.o. [id number] here for  1. LGSIL of cervix of undetermined  significance   .  PLAN: 1.  I discussed the grading system of pap smears and HPV high risk viral types.  We will discuss and base management after colpo results return. 2. Follow up PAP 12 months, vs intervention if high grade dysplasia identified      Prentice Docker, MD  [place] 03/14/2017  2:05 PM

## 2017-03-20 ENCOUNTER — Encounter: Payer: Self-pay | Admitting: Obstetrics and Gynecology

## 2017-03-20 LAB — PATHOLOGY

## 2017-05-19 DIAGNOSIS — E559 Vitamin D deficiency, unspecified: Secondary | ICD-10-CM | POA: Insufficient documentation

## 2017-06-13 ENCOUNTER — Encounter: Payer: Self-pay | Admitting: Family Medicine

## 2017-06-13 ENCOUNTER — Ambulatory Visit (INDEPENDENT_AMBULATORY_CARE_PROVIDER_SITE_OTHER): Payer: Medicaid Other | Admitting: Family Medicine

## 2017-06-13 VITALS — BP 129/74 | HR 84 | Temp 98.4°F | Ht 69.1 in | Wt 153.6 lb

## 2017-06-13 DIAGNOSIS — B070 Plantar wart: Secondary | ICD-10-CM

## 2017-06-13 DIAGNOSIS — R079 Chest pain, unspecified: Secondary | ICD-10-CM | POA: Diagnosis not present

## 2017-06-13 DIAGNOSIS — F419 Anxiety disorder, unspecified: Secondary | ICD-10-CM | POA: Diagnosis not present

## 2017-06-13 MED ORDER — BUPROPION HCL ER (SR) 150 MG PO TB12: ORAL_TABLET | ORAL | 3 refills | Status: DC

## 2017-06-13 NOTE — Assessment & Plan Note (Signed)
Will start wellbutrin. Recheck 1 month. Call with any concerns.

## 2017-06-13 NOTE — Patient Instructions (Addendum)
Plantar Warts Warts are small growths on the skin. They can occur on various areas of the body. When they occur on the underside (sole) of the foot, they are called plantar warts. Plantar warts often occur in groups, with several small warts around a larger growth. They tend to develop over areas of pressure, such as the heel or the ball of the foot. Most warts are not painful, and they usually do not cause problems. However, plantar warts may cause pain when you walk because pressure is applied to them. Warts often go away on their own in time. Various treatments may be done if needed. Sometimes, warts go away and then they come back again. What are the causes? Plantar warts are caused by a type of virus that is called human papillomavirus (HPV). HPV attacks a break in the skin of the foot. Walking barefoot can lead to exposure to the virus. These warts may spread to other areas of the sole. They spread to other areas of the body only through direct contact. What increases the risk? Plantar warts are more likely to develop in:  People who are 10-20 years of age.  People who use public showers or locker rooms.  People who have a weakened body defense system (immune system).  What are the signs or symptoms? Plantar warts may be flat or slightly raised. They may grow into the deeper layers of skin or rise above the surface of the skin. Most plantar warts have a rough surface. They may cause pain when you use your foot to support your body weight. How is this diagnosed? A plantar wart can usually be diagnosed from its appearance. In some cases, a tissue sample may be removed (biopsy) to be looked at under a microscope. How is this treated? In many cases, warts do not need treatment. Without treatment, they often go away over a period of many months to a couple years. If treatment is needed, options may include:  Applying medicated solutions, creams, or patches to the wart. These may be  over-the-counter or prescription medicines that make the skin soft so that layers will gradually shed away. In many cases, the medicine is applied one or two times per day and covered with a bandage.  Putting duct tape over the top of the wart (occlusion). You will leave the tape in place for as long as told by your health care provider, then you will replace it with a new strip of tape. This is done until the wart goes away.  Freezing the wart with liquid nitrogen (cryotherapy).  Burning the wart with: ? Laser treatment. ? An electrified probe (electrocautery).  Injection of a medicine (Candida antigen) into the wart to help the body's immune system to fight off the wart.  Surgery to remove the wart.  Follow these instructions at home:  Apply medicated creams or solutions only as told by your health care provider. This may involve: ? Soaking the affected area in warm water. ? Removing the top layer of softened skin before you apply the medicine. A pumice stone works well for removing the tissue. ? Applying a bandage over the affected area after you apply the medicine. ? Repeating the process daily or as told by your health care provider.  Do not scratch or pick at a wart.  Wash your hands after you touch a wart.  If a wart is painful, try applying a bandage with a hole in the middle over the wart. The helps to take   pressure off the wart.  Keep all follow-up visits as told by your health care provider. This is important. How is this prevented? Take these actions to help prevent warts:  Wear shoes and socks. Change your socks daily.  Keep your feet clean and dry.  Check your feet regularly.  Avoid direct contact with warts on other people.  Contact a health care provider if:  Your warts do not improve after treatment.  You have redness, swelling, or pain at the site of a wart.  You have bleeding from a wart that does not stop with light pressure.  You have diabetes and  you develop a wart. This information is not intended to replace advice given to you by your health care provider. Make sure you discuss any questions you have with your health care provider. Document Released: 02/01/2004 Document Revised: 04/18/2016 Document Reviewed: 02/06/2015 Elsevier Interactive Patient Education  2018 [employer]  

## 2017-06-13 NOTE — Progress Notes (Signed)
BP 129/74 (BP Location: Left Arm, Patient Position: Sitting, Cuff Size: Normal)   Pulse 84   Temp 98.4 F (36.9 C)   Ht 5' 9.1" (1.755 m)   Wt 153 lb 9 oz (69.7 kg)   SpO2 99%   BMI 22.61 kg/m    Subjective:    Patient ID: Jessica Peters, female    DOB: 31-Jan-1981, 36 y.o.   MRN: [id number]  HPI: Jessica Peters is a 36 y.o. female who presents today to establish care. She notes that she last saw her PCP in September of last year.   Chief Complaint  Patient presents with  . Establish Care   She has been having a bit of stress and some chest pains. She notes that she is trying to quit smoking and she is under a lot of stress. Her SO is not working right now  CHEST PAIN Time since onset: With stress Duration: months Onset: sudden Quality: tight Severity: severe Location: substernal Radiation: none Episode duration: 5 minutes Frequency: 1-2x a week Related to exertion: no Activity when pain started: stress Trauma: no Anxiety/recent stressors: yes Status: fluctuating Treatments attempted: antacids  Current pain status: pain free Shortness of breath: yes Cough: no Nausea: no Diaphoresis: yes Heartburn: no Palpitations: yes  ANXIETY/STRESS- has known PTSD, was on xanax TID- felt like a zombie Duration:uncontrolled Anxious mood: yes  Excessive worrying: yes Irritability: yes  Sweating: yes Nausea: no Palpitations:no Hyperventilation: no Panic attacks: no Agoraphobia: no  Obscessions/compulsions: no Depressed mood: yes Depression screen PHQ 2/9 06/13/2017  Decreased Interest 0  Down, Depressed, Hopeless 0  PHQ - 2 Score 0  Altered sleeping 2  Tired, decreased energy 0  Change in appetite 0  Feeling bad or failure about yourself  0  Trouble concentrating 0  Moving slowly or fidgety/restless 0  Suicidal thoughts 0  PHQ-9 Score 2   GAD 7 : Generalized Anxiety Score 06/13/2017  Nervous, Anxious, on Edge 1  Control/stop worrying 2  Worry too much -  different things 2  Trouble relaxing 1  Restless 1  Easily annoyed or irritable 1  Afraid - awful might happen 0  Total GAD 7 Score 8  Anxiety Difficulty Somewhat difficult   Anhedonia: no Weight changes: no Insomnia: yes hard to fall asleep  Hypersomnia: no Fatigue/loss of energy: yes Feelings of worthlessness: no Feelings of guilt: no Impaired concentration/indecisiveness: no Suicidal ideations: no  Crying spells: yes Recent Stressors/Life Changes: yes   Relationship problems: no   Family stress: yes     Financial stress: yes    Job stress: no    Recent death/loss: no   Active Ambulatory Problems    Diagnosis Date Noted  . Breast lump on right side at 1 o'clock position 02/03/2017  . Family history of breast cancer   . Vitamin D deficiency   . Anxiety 06/13/2017   Resolved Ambulatory Problems    Diagnosis Date Noted  . Menorrhagia with irregular cycle 12/07/2015   Past Medical History:  Diagnosis Date  . Allergy   . Anemia   . Anxiety   . Endometriosis   . Family history of breast cancer   . Headache   . Ovarian cyst   . Ovarian cyst   . PTSD (post-traumatic stress disorder)   . PTSD (post-traumatic stress disorder)   . Thrombocytopenia ([place])   . Vitamin D deficiency    Past Surgical History:  Procedure Laterality Date  . [place]  12/07/2015   Procedure: DILATATION & CURETTAGE/HYSTEROSCOPY WITH NOVASURE ABLATION;  Surgeon: Will Bonnet, MD;  Location: [facility] ORS;  Service: Gynecology;;  . MOUTH SURGERY    . TONSILLECTOMY    . TUBAL LIGATION     Outpatient Encounter Prescriptions as of 06/13/2017  Medication Sig  . ibuprofen (ADVIL,MOTRIN) 600 MG tablet Take 1 tablet (600 mg total) by mouth every 6 (six) hours as needed for mild pain or cramping.  Marland Kitchen buPROPion (WELLBUTRIN SR) 150 MG 12 hr tablet 1 tab in AM for 1 week, then 1 tab BID  . [DISCONTINUED] BIOTIN PO Take 1 tablet by mouth daily.  .  [DISCONTINUED] Cholecalciferol (VITAMIN D3) 5000 units TABS Take 1 tablet by mouth once a week.  . [DISCONTINUED] Desvenlafaxine ER (PRISTIQ) 50 MG TB24 Take by mouth daily.  . [DISCONTINUED] doxycycline (VIBRA-TABS) 100 MG tablet Take 1 tablet (100 mg total) by mouth 2 (two) times daily. (Patient not taking: Reported on 02/03/2017)  . [DISCONTINUED] ferrous sulfate 325 (65 FE) MG tablet Take 325 mg by mouth daily with breakfast.  . [DISCONTINUED] HYDROcodone-acetaminophen (NORCO) 5-325 MG tablet Take 1 tablet by mouth every 6 (six) hours as needed for moderate pain. (Patient not taking: Reported on 02/03/2017)  . [DISCONTINUED] metroNIDAZOLE (FLAGYL) 500 MG tablet Take 1 tablet (500 mg total) by mouth 2 (two) times daily. (Patient not taking: Reported on 02/03/2017)  . [DISCONTINUED] oxyCODONE-acetaminophen (ROXICET) 5-325 MG tablet Take 1 tablet by mouth every 6 (six) hours as needed. (Patient not taking: Reported on 02/03/2017)   No facility-administered encounter medications on file as of 06/13/2017.    No Known Allergies  Family History  Problem Relation Age of Onset  . Breast cancer Paternal Aunt 65  . Cancer Paternal Aunt        Breast  . Breast cancer Paternal Grandmother 68  . Stroke Paternal Grandmother   . Clotting disorder Paternal Grandmother   . Cancer Paternal Grandmother        Breast  . Depression Mother   . Depression Sister   . Alcohol abuse Maternal Grandmother   . Stroke Maternal Grandmother   . Depression Maternal Grandmother   . Cancer Maternal Grandmother        Liver  . Alcohol abuse Maternal Grandfather   . Stroke Maternal Grandfather   . Cancer Maternal Grandfather        Liver  . Stroke Paternal Grandfather   . Heart disease Paternal Grandfather    Social History   Social History  . Marital status: Single    Spouse name: N/A  . Number of children: N/A  . Years of education: N/A   Occupational History  . Not on file.   Social History Main Topics    . Smoking status: Current Every Day Smoker    Packs/day: 0.50    Years: 13.00    Types: Cigarettes  . Smokeless tobacco: Never Used  . Alcohol use 3.6 oz/week    3 Glasses of wine, 3 Cans of beer per week     Comment: socially  . Drug use: No  . Sexual activity: Yes    Birth control/ protection: Surgical   Other Topics Concern  . Not on file   Social History Narrative  . No narrative on file    Review of Systems  Constitutional: Negative.   Respiratory: Negative.   Cardiovascular: Negative.   Psychiatric/Behavioral: Negative for agitation, behavioral problems, confusion, decreased concentration, dysphoric mood, hallucinations, self-injury, sleep disturbance and suicidal ideas.  The patient is nervous/anxious. The patient is not hyperactive.     Per HPI unless specifically indicated above     Objective:    BP 129/74 (BP Location: Left Arm, Patient Position: Sitting, Cuff Size: Normal)   Pulse 84   Temp 98.4 F (36.9 C)   Ht 5' 9.1" (1.755 m)   Wt 153 lb 9 oz (69.7 kg)   SpO2 99%   BMI 22.61 kg/m   Wt Readings from Last 3 Encounters:  06/13/17 153 lb 9 oz (69.7 kg)  03/14/17 154 lb (69.9 kg)  02/03/17 152 lb (68.9 kg)    Physical Exam  Constitutional: She is oriented to person, place, and time. She appears well-developed and well-nourished. No distress.  HENT:  Head: Normocephalic and atraumatic.  Right Ear: Hearing normal.  Left Ear: Hearing normal.  Nose: Nose normal.  Eyes: Conjunctivae and lids are normal. Right eye exhibits no discharge. Left eye exhibits no discharge. No scleral icterus.  Cardiovascular: Normal rate, regular rhythm, normal heart sounds and intact distal pulses.  Exam reveals no gallop and no friction rub.   No murmur heard. Pulmonary/Chest: Effort normal and breath sounds normal. No respiratory distress. She has no wheezes. She has no rales. She exhibits no tenderness.  Musculoskeletal: Normal range of motion.  Neurological: She is alert  and oriented to person, place, and time.  Skin: Skin is warm, dry and intact. No rash noted. She is not diaphoretic. No erythema. No pallor.  Psychiatric: She has a normal mood and affect. Her speech is normal and behavior is normal. Judgment and thought content normal. Cognition and memory are normal.  Nursing note and vitals reviewed.   Results for orders placed or performed in visit on 03/14/17  Pathology  Result Value Ref Range   . Comment    . Comment    . Comment    . Comment    . Comment    . Comment    . Comment       Assessment & Plan:   Problem List Items Addressed This Visit      Other   Anxiety    Will start wellbutrin. Recheck 1 month. Call with any concerns.       Relevant Medications   buPROPion (WELLBUTRIN SR) 150 MG 12 hr tablet    Other Visit Diagnoses    Chest pain, unspecified type    -  Primary   Likely stress- will check EKG. EKG normal. Recheck 1 month.    Relevant Orders   EKG 12-Lead (Completed)   Plantar wart of left foot       Will pumice over the weekend and we will cryo next week.        Follow up plan: Return Tuesday- Wart removal, 1 month, follow up mood, for Records release please.

## 2017-06-17 ENCOUNTER — Ambulatory Visit: Payer: Self-pay | Admitting: Family Medicine

## 2017-06-17 ENCOUNTER — Ambulatory Visit (INDEPENDENT_AMBULATORY_CARE_PROVIDER_SITE_OTHER): Payer: Medicaid Other | Admitting: Family Medicine

## 2017-06-17 ENCOUNTER — Encounter: Payer: Self-pay | Admitting: Family Medicine

## 2017-06-17 VITALS — BP 116/70 | HR 78 | Ht 69.0 in | Wt 155.0 lb

## 2017-06-17 DIAGNOSIS — L570 Actinic keratosis: Secondary | ICD-10-CM | POA: Diagnosis not present

## 2017-06-17 DIAGNOSIS — B070 Plantar wart: Secondary | ICD-10-CM | POA: Diagnosis not present

## 2017-06-17 NOTE — Patient Instructions (Addendum)
Actinic Keratosis An actinic keratosis is a precancerous growth on the skin. This means that it could develop into skin cancer if it is not treated. About 1% of these growths (actinic keratoses) turn into skin cancer within one year if they are not treated. It is important to have all of these growths evaluated to determine the best treatment approach. What are the causes? This condition is caused by getting too much ultraviolet (UV) radiation from the sun or other UV light sources. What increases the risk? The following factors may make you more likely to develop this condition:  Having light-colored skin and blue eyes.  Having blonde or red hair.  Spending a lot of time in the sun.  Inadequate skin protection when outdoors. This may include: ? Not using sunscreen properly. ? Not covering up skin that is exposed to sunlight.  Aging. The risk of developing an actinic keratosis increases with age.  What are the signs or symptoms? Actinic keratoses look like scaly, rough spots of skin.They can be as small as a pinhead or as big as a quarter. They may itch, hurt, or feel sensitive. In most cases, the growths become red. In some cases, they may be skin-colored, light tan, dark tan, pink, or a combination of any of these colors. There may be a small piece of pink or gray skin (skin tag) growing from the actinic keratosis. In some cases, it may be easier to notice actinic keratoses by feeling them, rather than seeing them. Actinic keratoses appear most often on areas of skin that get a lot of sun exposure, including the scalp, face, ears, lips, upper back, forearms, and the backs of the hands. Sometimes, actinic keratoses disappear, but many reappear a few days to a few weeks later. How is this diagnosed? This condition is usually diagnosed with a physical exam. A tissue sample may be removed from the actinic keratosis and examined under a microscope (biopsy). How is this treated?  Treatment for  this condition may include:  Scraping off the actinic keratosis (curettage).  Freezing the actinic keratosis with liquid nitrogen (cryosurgery). This causes the growth to eventually fall off the skin.  Applying medicated creams or gels to destroy the cells in the growth.  Applying chemicals to the actinic keratosis to make the outer layers of skin peel off (chemical peel).  Photodynamic therapy. In this procedure, medicated cream is applied to the actinic keratosis. This cream increases your skin's sensitivity to light. Then, a strong light is aimed at the actinic keratosis to destroy cells in the growth.  Follow these instructions at home: Skin care  Apply cool, wet cloths (cool compresses) to the affected areas.  Do not scratch your skin.  Check your skin regularly for any growths, especially growths that: ? Start to itch or bleed. ? Change in size, shape, or color. Caring for the treated area  Keep the treated area clean and dry as told by your health care provider.  Do not apply any medicine, cream, or lotion to the treated area unless your health care provider tells you to do that.  Do not pick at blisters or try to break them open. This can cause infection and scarring.  If you have red or irritated skin after treatment, follow instructions from your health care provider about how to take care of the treated area. Make sure you: ? Wash your hands with soap and water before you change your bandage (dressing). If soap and water are not available, use  hand sanitizer. ? Change your dressing as told by your health care provider.  If you have red or irritated skin after treatment, check your treated area every day for signs of infection. Check for: ? Swelling, pain, or more redness. ? Fluid or blood. ? Warmth. ? Pus or a bad smell. General instructions  Take over-the-counter and prescription medicines only as told by your health care provider.  Return to your normal  activities as told by your health care provider. Ask your health care provider what activities are safe for you.  Do not use any tobacco products, such as cigarettes, chewing tobacco, and e-cigarettes. If you need help quitting, ask your health care provider.  Have a skin exam done every year by a health care provider who is a skin conditions specialist (dermatologist).  Keep all follow-up visits as told by your health care provider. This is important. How is this prevented?  Do not get sunburns.  Try to avoid the sun between 10:00 a.m. and 4:00 p.m. This is when the UV light is the strongest.  Use a sunscreen or sunblock with SPF 30 (sun protection factor 30) or greater.  Apply sunscreen before you are exposed to sunlight, and reapply periodically as often as directed by the instructions on the sunscreen container.  Always wear sunglasses that have UV protection, and always wear hats and clothing to protect your skin from sunlight.  When possible, avoid medicines that increase your sensitivity to sunlight. These include: ? Certain antibiotic medicines. ? Certain water pills (diuretics). ? Certain prescription medicines that are used to treat acne (retinoids).  Do not use tanning beds or other indoor tanning devices. Contact a health care provider if:  You notice any changes or new growths on your skin.  You have swelling, pain, or more redness around your treated area.  You have fluid or blood coming from your treated area.  Your treated area feels warm to the touch.  You have pus or a bad smell coming from your treated area.  You have a fever.  You have a blister that becomes large and painful. This information is not intended to replace advice given to you by your health care provider. Make sure you discuss any questions you have with your health care provider. Document Released: 02/07/2009 Document Revised: 07/12/2016 Document Reviewed: 07/22/2015 Elsevier Interactive  Patient Education  2018 [employer].  Cryosurgery for Skin Conditions Cryosurgery, also called cryotherapy, is the use of extreme cold to freeze and remove abnormal or diseased tissue. Growths on the skin such as warts, precancerous skin lesions (actinic keratoses), and some skin cancers may be removed with cryosurgery. Cryosurgery usually takes a few minutes, and it can be done in your health care provider's office. Tell a health care provider about:  Any allergies you have.  All medicines you are taking, including vitamins, herbs, eye drops, creams, and over-the-counter medicines.  Any problems you or family members have had with anesthetic medicines.  Any blood disorders you have.  Any surgeries you have had.  Any medical conditions you have. What are the risks? Generally, this is a safe procedure. However, problems may occur, including:  Bleeding.  Scarring.  Changes in skin color (lighter or darker than normal skin tone).  Swelling.  Hair loss in the treated area.  Nerve damage and loss of feeling (rare).  What happens before the procedure? No specific preparation is necessary for this procedure. What happens during the procedure?  Your procedure will be performed using  one of the following methods: ? Your health care provider may apply a device (probe) to the skin. The probe has liquid nitrogen flowing through it to cool it down. The probe will be applied to the skin until the skin is frozen and destroyed. ? Your health care provider may apply liquid nitrogen to the skin with a swab or by spraying it until the skin is frozen and destroyed.  The treated area may be covered with a bandage (dressing). These procedures may vary among health care providers and clinics. What happens after the procedure?  Shortly after the procedure, the treated area will become red and swollen. A blister may form. Summary  Cryosurgery, also called cryotherapy, is the use of extreme  cold to freeze and remove abnormal or diseased tissue.  Cryosurgery usually takes a few minutes, and it can be done in your health care provider's office.  There are two different methods for performing cryosurgery. One method involves using a probe to freeze the growth, and the other method involves applying liquid nitrogen directly to the growth. This information is not intended to replace advice given to you by your health care provider. Make sure you discuss any questions you have with your health care provider. Document Released: 11/08/2000 Document Revised: 09/30/2016 Document Reviewed: 09/30/2016 Elsevier Interactive Patient Education  2017 [employer].

## 2017-06-17 NOTE — Progress Notes (Signed)
BP 116/70   Pulse 78   Ht 5\' 9"  (1.753 m)   Wt 155 lb (70.3 kg)   SpO2 (!) 78%   BMI 22.89 kg/m    Subjective:    Patient ID: Jessica Peters, female    DOB: 12-Mar-1981, 36 y.o.   MRN: [id number]  HPI: Jessica Peters is a 36 y.o. female  Chief Complaint  Patient presents with  . Plantar Warts   Has a wart on the bottom of her foot. It's been there a couple of weeks. Hurts like a piece of glass is stuck in it every time she walks on it. Has been pumicing and soaking her foot. Would like it frozen today. Also has spot on her R thigh that has been scaling up and then falling off and keeps coming back. Otherwise doing well with no other concerns or complaints at this time.   Relevant past medical, surgical, family and social history reviewed and updated as indicated. Interim medical history since our last visit reviewed. Allergies and medications reviewed and updated.  Review of Systems  Constitutional: Negative.   Respiratory: Negative.   Cardiovascular: Negative.   Musculoskeletal: Positive for gait problem. Negative for arthralgias, back pain, joint swelling, myalgias, neck pain and neck stiffness.  Psychiatric/Behavioral: Negative.     Per HPI unless specifically indicated above     Objective:    BP 116/70   Pulse 78   Ht 5\' 9"  (1.753 m)   Wt 155 lb (70.3 kg)   SpO2 (!) 78%   BMI 22.89 kg/m   Wt Readings from Last 3 Encounters:  06/17/17 155 lb (70.3 kg)  06/13/17 153 lb 9 oz (69.7 kg)  03/14/17 154 lb (69.9 kg)    Physical Exam  Constitutional: She is oriented to person, place, and time. She appears well-developed and well-nourished. No distress.  HENT:  Head: Normocephalic and atraumatic.  Right Ear: Hearing normal.  Left Ear: Hearing normal.  Nose: Nose normal.  Eyes: Conjunctivae and lids are normal. Right eye exhibits no discharge. Left eye exhibits no discharge. No scleral icterus.  Pulmonary/Chest: Effort normal. No respiratory distress.    Musculoskeletal: Normal range of motion.  Neurological: She is alert and oriented to person, place, and time.  Skin: Skin is warm, dry and intact. No rash noted. No erythema. No pallor.  Plantar wart of the bottom of her L foot scaley stuck on lesion R thigh  Psychiatric: She has a normal mood and affect. Her speech is normal and behavior is normal. Judgment and thought content normal. Cognition and memory are normal.    Results for orders placed or performed in visit on 03/14/17  Pathology  Result Value Ref Range   . Comment    . Comment    . Comment    . Comment    . Comment    . Comment    . Comment       Assessment & Plan:   Problem List Items Addressed This Visit    None    Visit Diagnoses    Plantar wart of left foot    -  Primary   Pared down with a #15 blade scalple today, then frozen with 4 cycles of 5 seconds.   AK (actinic keratosis)       Spot on her R thigh frozen with 4 5 second cycles of cryotherapy. Call with any concerns.        Follow up plan: Return in about 4 weeks (  around 07/15/2017) for Follow up mood.

## 2017-06-23 ENCOUNTER — Ambulatory Visit: Payer: Self-pay | Admitting: Family Medicine

## 2017-07-25 ENCOUNTER — Ambulatory Visit: Payer: Medicaid Other | Admitting: Family Medicine

## 2017-10-01 ENCOUNTER — Ambulatory Visit: Payer: Medicaid Other | Admitting: Family Medicine

## 2017-10-01 DIAGNOSIS — J309 Allergic rhinitis, unspecified: Secondary | ICD-10-CM | POA: Diagnosis not present

## 2017-10-01 DIAGNOSIS — F339 Major depressive disorder, recurrent, unspecified: Secondary | ICD-10-CM

## 2017-10-01 MED ORDER — FLUTICASONE PROPIONATE 50 MCG/ACT NA SUSP: 2.0000 | Freq: Every day | NASAL | 6 refills | Status: DC

## 2017-10-01 MED ORDER — BUSPIRONE HCL 15 MG PO TABS: ORAL_TABLET | ORAL | 1 refills | Status: DC

## 2017-10-01 MED ORDER — CETIRIZINE HCL 10 MG PO TABS: 10.0000 mg | ORAL_TABLET | Freq: Every day | ORAL | 11 refills | Status: DC

## 2017-10-01 MED ORDER — HYDROXYZINE HCL 25 MG PO TABS: 25.0000 mg | ORAL_TABLET | Freq: Three times a day (TID) | ORAL | 0 refills | Status: DC | PRN

## 2017-10-01 NOTE — Progress Notes (Signed)
BP 106/71   Pulse 86   Temp 98.6 F (37 C) (Oral)   Wt 157 lb (71.2 kg)   SpO2 98%   BMI 23.18 kg/m    Subjective:    Patient ID: Jessica Peters, female    DOB: 1981/03/03, 36 y.o.   MRN: [id number]  HPI: Jessica Peters is a 36 y.o. female  Chief Complaint  Patient presents with  . Allergies    Cannot breath at night  . Stress   Patient presents today to discuss worsening allergy sxs. Has used flonase twice daily, not currently trying anything else. Worst at night, feels like her breathing gets worse from chest tightness. Having persistent congestion, post-nasal drainage, sore throat.   Also struggling with anxiety - working multiple jobs and caring for 4 children with a currently disabled significant other. States she constantly feels overwhelmed, can't shut her mind off to get any sleep, has crying spells. Has tried zoloft and wellbutrin with no benefit. Denies SI/HI.   Past Medical History:  Diagnosis Date  . Allergy   . Anemia   . Anxiety   . Endometriosis   . Family history of breast cancer    My Risk cancer genetic testing letter sent 3/18  . Headache    h/o migraines  . Ovarian cyst   . Ovarian cyst   . PTSD (post-traumatic stress disorder)   . PTSD (post-traumatic stress disorder)   . Thrombocytopenia ([place])   . Vitamin D deficiency    Social History   Socioeconomic History  . Marital status: Single    Spouse name: Not on file  . Number of children: Not on file  . Years of education: Not on file  . Highest education level: Not on file  Social Needs  . Financial resource strain: Not on file  . Food insecurity - worry: Not on file  . Food insecurity - inability: Not on file  . Transportation needs - medical: Not on file  . Transportation needs - non-medical: Not on file  Occupational History  . Not on file  Tobacco Use  . Smoking status: Current Every Day Smoker    Packs/day: 0.50    Years: 13.00    Pack years: 6.50    Types: Cigarettes  .  Smokeless tobacco: Never Used  Substance and Sexual Activity  . Alcohol use: Yes    Alcohol/week: 3.6 oz    Types: 3 Glasses of wine, 3 Cans of beer per week    Comment: socially  . Drug use: No  . Sexual activity: Yes    Birth control/protection: Surgical  Other Topics Concern  . Not on file  Social History Narrative  . Not on file    Relevant past medical, surgical, family and social history reviewed and updated as indicated. Interim medical history since our last visit reviewed. Allergies and medications reviewed and updated.  Review of Systems  Constitutional: Positive for fatigue.  HENT: Positive for congestion, postnasal drip and sore throat.   Eyes: Negative.   Respiratory: Positive for cough and chest tightness.   Gastrointestinal: Negative.   Musculoskeletal: Negative.   Skin: Negative.   Neurological: Negative.   Psychiatric/Behavioral: Positive for dysphoric mood and sleep disturbance. The patient is nervous/anxious.    Per HPI unless specifically indicated above     Objective:    BP 106/71   Pulse 86   Temp 98.6 F (37 C) (Oral)   Wt 157 lb (71.2 kg)   SpO2 98%  BMI 23.18 kg/m   Wt Readings from Last 3 Encounters:  10/01/17 157 lb (71.2 kg)  06/17/17 155 lb (70.3 kg)  06/13/17 153 lb 9 oz (69.7 kg)    Physical Exam  Constitutional: She is oriented to person, place, and time. She appears well-developed and well-nourished. No distress.  HENT:  Head: Atraumatic.  Nasal mucosa boggy and injected Posterior oropharynx erythematous  Eyes: Conjunctivae are normal. Pupils are equal, round, and reactive to light.  Neck: Normal range of motion. Neck supple.  Cardiovascular: Normal rate and normal heart sounds.  Pulmonary/Chest: Effort normal and breath sounds normal. No respiratory distress.  Musculoskeletal: Normal range of motion.  Neurological: She is alert and oriented to person, place, and time.  Skin: Skin is warm and dry.  Psychiatric: Her behavior  is normal.  tearful  Nursing note and vitals reviewed.     Assessment & Plan:   Problem List Items Addressed This Visit      Respiratory   Allergic rhinitis    Will also add zyrtec to flonase regimen. If needed, will add singulair as well.         Other   Major depression, recurrent, chronic (HCC)    Will start buspar and closely monitor for benefit. Risks and benefits reviewed. Will also try hydroxyzine for sleep and severe anxiety episodes. Reviewed sedation side effects and precautions there with driving, etc. Will follow up in 1 month to see how things are going. Pt states she does not have time for counseling right now with how busy her life is.       Relevant Medications   busPIRone (BUSPAR) 15 MG tablet   hydrOXYzine (ATARAX/VISTARIL) 25 MG tablet       Follow up plan: Return in about 4 weeks (around 10/29/2017) for Anxiety.

## 2017-10-03 ENCOUNTER — Encounter: Payer: Self-pay | Admitting: Family Medicine

## 2017-10-03 DIAGNOSIS — F339 Major depressive disorder, recurrent, unspecified: Secondary | ICD-10-CM | POA: Insufficient documentation

## 2017-10-03 DIAGNOSIS — J309 Allergic rhinitis, unspecified: Secondary | ICD-10-CM | POA: Insufficient documentation

## 2017-10-03 NOTE — Patient Instructions (Signed)
Follow up in 1 month   

## 2017-10-03 NOTE — Assessment & Plan Note (Signed)
Will start buspar and closely monitor for benefit. Risks and benefits reviewed. Will also try hydroxyzine for sleep and severe anxiety episodes. Reviewed sedation side effects and precautions there with driving, etc. Will follow up in 1 month to see how things are going. Pt states she does not have time for counseling right now with how busy her life is.

## 2017-10-03 NOTE — Assessment & Plan Note (Signed)
Will also add zyrtec to flonase regimen. If needed, will add singulair as well.

## 2017-10-06 ENCOUNTER — Ambulatory Visit: Payer: Self-pay | Admitting: Family Medicine

## 2017-10-08 ENCOUNTER — Encounter: Payer: Self-pay | Admitting: Family Medicine

## 2017-10-08 ENCOUNTER — Ambulatory Visit (INDEPENDENT_AMBULATORY_CARE_PROVIDER_SITE_OTHER): Payer: Medicaid Other | Admitting: Family Medicine

## 2017-10-08 VITALS — BP 118/77 | HR 68 | Temp 98.5°F | Wt 159.6 lb

## 2017-10-08 DIAGNOSIS — L03211 Cellulitis of face: Secondary | ICD-10-CM

## 2017-10-08 MED ORDER — AMOXICILLIN-POT CLAVULANATE 875-125 MG PO TABS: 1.0000 | ORAL_TABLET | Freq: Two times a day (BID) | ORAL | 0 refills | Status: DC

## 2017-10-08 NOTE — Progress Notes (Signed)
BP 118/77 (BP Location: Left Arm, Patient Position: Sitting, Cuff Size: Normal)   Pulse 68   Temp 98.5 F (36.9 C)   Wt 159 lb 9 oz (72.4 kg)   SpO2 100%   BMI 23.56 kg/m    Subjective:    Patient ID: Jessica Peters, female    DOB: Jul 17, 1981, 36 y.o.   MRN: [id number]  HPI: Jessica Peters is a 36 y.o. female  Chief Complaint  Patient presents with  . Ear Pain   EAR PAIN- severe swelling and pain behind R ear started this AM Duration: today Involved ear(s): right Severity:  severe  Quality:  sharp Fever: no Otorrhea: no Upper respiratory infection symptoms: no Pruritus: no Hearing loss: no Water immersion no Using Q-tips: no Recurrent otitis media: no Status: worse Treatments attempted: none  Relevant past medical, surgical, family and social history reviewed and updated as indicated. Interim medical history since our last visit reviewed. Allergies and medications reviewed and updated.  Review of Systems  Constitutional: Negative.   HENT: Negative.   Respiratory: Negative.   Cardiovascular: Negative.   Skin: Positive for color change. Negative for pallor, rash and wound.  Psychiatric/Behavioral: Negative.     Per HPI unless specifically indicated above     Objective:    BP 118/77 (BP Location: Left Arm, Patient Position: Sitting, Cuff Size: Normal)   Pulse 68   Temp 98.5 F (36.9 C)   Wt 159 lb 9 oz (72.4 kg)   SpO2 100%   BMI 23.56 kg/m   Wt Readings from Last 3 Encounters:  10/08/17 159 lb 9 oz (72.4 kg)  10/01/17 157 lb (71.2 kg)  06/17/17 155 lb (70.3 kg)    Physical Exam  Constitutional: She is oriented to person, place, and time. She appears well-developed and well-nourished. No distress.  HENT:  Head: Normocephalic and atraumatic.  Right Ear: Hearing, tympanic membrane, external ear and ear canal normal.  Left Ear: Hearing, tympanic membrane, external ear and ear canal normal.  Nose: Nose normal.  Mouth/Throat: Uvula is midline,  oropharynx is clear and moist and mucous membranes are normal. No oropharyngeal exudate.  Some dry wax in EAC on the R- but able to see clear TM behind it  Swelling redness and extreme tenderness to very light palpation of the mastoid process on the R, protrusion of the pinna, tenderness and swelling of anterior chain lymph nodes on the R  Eyes: Conjunctivae and lids are normal. Right eye exhibits no discharge. Left eye exhibits no discharge. No scleral icterus.  Cardiovascular: Normal rate, regular rhythm, normal heart sounds and intact distal pulses. Exam reveals no gallop and no friction rub.  No murmur heard. Pulmonary/Chest: Effort normal and breath sounds normal. No respiratory distress. She has no wheezes. She has no rales. She exhibits no tenderness.  Musculoskeletal: Normal range of motion.  Neurological: She is alert and oriented to person, place, and time.  Skin: Skin is warm, dry and intact. No rash noted. She is not diaphoretic. There is erythema. No pallor.  Heat, swelling and tenderness behind R ear  Psychiatric: She has a normal mood and affect. Her speech is normal and behavior is normal. Judgment and thought content normal. Cognition and memory are normal.  Nursing note and vitals reviewed.   Results for orders placed or performed in visit on 03/14/17  Pathology  Result Value Ref Range   . Comment    . Comment    . Comment    .  Comment    . Comment    . Comment    . Comment       Assessment & Plan:   Problem List Items Addressed This Visit    None    Visit Diagnoses    Cellulitis of face    -  Primary   Will treat with augmentin. Given degree of tenderness and rapidity of onset, concern for mastoiditis- will get into see ENT ASAP.       Follow up plan: Return 1-2 days depending on when she's seeing ENT.

## 2017-10-09 DIAGNOSIS — L723 Sebaceous cyst: Secondary | ICD-10-CM | POA: Diagnosis not present

## 2017-10-09 DIAGNOSIS — H6121 Impacted cerumen, right ear: Secondary | ICD-10-CM | POA: Diagnosis not present

## 2017-10-30 ENCOUNTER — Ambulatory Visit: Payer: Medicaid Other | Admitting: Family Medicine

## 2017-12-22 ENCOUNTER — Ambulatory Visit (INDEPENDENT_AMBULATORY_CARE_PROVIDER_SITE_OTHER): Payer: Medicaid Other | Admitting: Family Medicine

## 2017-12-22 ENCOUNTER — Encounter: Payer: Self-pay | Admitting: Family Medicine

## 2017-12-22 VITALS — BP 113/72 | HR 83 | Temp 98.8°F | Wt 158.1 lb

## 2017-12-22 DIAGNOSIS — J111 Influenza due to unidentified influenza virus with other respiratory manifestations: Secondary | ICD-10-CM | POA: Diagnosis not present

## 2017-12-22 LAB — VERITOR FLU A/B WAIVED
INFLUENZA A: NEGATIVE
INFLUENZA B: NEGATIVE

## 2017-12-22 MED ORDER — OSELTAMIVIR PHOSPHATE 75 MG PO CAPS: 75.0000 mg | ORAL_CAPSULE | Freq: Two times a day (BID) | ORAL | 0 refills | Status: DC

## 2017-12-22 MED ORDER — HYDROCOD POLST-CPM POLST ER 10-8 MG/5ML PO SUER: 5.0000 mL | Freq: Two times a day (BID) | ORAL | 0 refills | Status: DC | PRN

## 2017-12-22 MED ORDER — LIDOCAINE VISCOUS 2 % MT SOLN
5.0000 mL | OROMUCOSAL | 0 refills | Status: DC | PRN
Start: 2017-12-22 — End: 2018-01-05

## 2017-12-22 NOTE — Progress Notes (Signed)
BP 113/72 (BP Location: Right Arm, Patient Position: Sitting, Cuff Size: Normal)   Pulse 83   Temp 98.8 F (37.1 C) (Oral)   Wt 158 lb 1.6 oz (71.7 kg)   SpO2 98%   BMI 23.35 kg/m    Subjective:    Patient ID: Jessica Peters, female    DOB: August 04, 1981, 37 y.o.   MRN: [id number]  HPI: Jessica Peters is a 37 y.o. female  Chief Complaint  Patient presents with  . Nasal Congestion    x's 3 days. Severe fatigue.   Marland Kitchen Headache  . Cough  . Sore Throat  . Fatigue   Watering eyes, rhinorrhea, severe fatigue, cough, sore throat, chills, sweats, body aches, headache x 2 days. Significant other also sick with same symptoms. Taking alka seltzer cold and flu, green tea with honey and lemon, ibuprofen, mucinex, and chloraseptic spray with minimal relief.   Relevant past medical, surgical, family and social history reviewed and updated as indicated. Interim medical history since our last visit reviewed. Allergies and medications reviewed and updated.  Review of Systems  Constitutional: Positive for chills, diaphoresis and fatigue.  HENT: Positive for rhinorrhea and sore throat.   Eyes: Positive for discharge.  Respiratory: Positive for cough.   Cardiovascular: Negative.   Gastrointestinal: Negative.   Genitourinary: Negative.   Musculoskeletal: Positive for myalgias.  Neurological: Positive for headaches.  Psychiatric/Behavioral: Negative.    Per HPI unless specifically indicated above     Objective:    BP 113/72 (BP Location: Right Arm, Patient Position: Sitting, Cuff Size: Normal)   Pulse 83   Temp 98.8 F (37.1 C) (Oral)   Wt 158 lb 1.6 oz (71.7 kg)   SpO2 98%   BMI 23.35 kg/m   Wt Readings from Last 3 Encounters:  12/22/17 158 lb 1.6 oz (71.7 kg)  10/08/17 159 lb 9 oz (72.4 kg)  10/01/17 157 lb (71.2 kg)    Physical Exam  Constitutional: She is oriented to person, place, and time. She appears well-developed and well-nourished. No distress.  HENT:  Head:  Atraumatic.  Nasal mucosa and oropharynx erythematous, rhinorrhea present B/l middle ear effusion  Eyes: Conjunctivae are normal. Pupils are equal, round, and reactive to light. No scleral icterus.  Neck: Normal range of motion. Neck supple.  Cardiovascular: Normal rate and normal heart sounds.  Pulmonary/Chest: Effort normal and breath sounds normal. No respiratory distress. She has no wheezes. She has no rales.  Musculoskeletal: Normal range of motion.  Lymphadenopathy:    She has no cervical adenopathy.  Neurological: She is alert and oriented to person, place, and time.  Skin: Skin is warm and dry.  Psychiatric: She has a normal mood and affect. Her behavior is normal.  Nursing note and vitals reviewed.   Results for orders placed or performed in visit on 03/14/17  Pathology  Result Value Ref Range   . Comment    . Comment    . Comment    . Comment    . Comment    . Comment    . Comment       Assessment & Plan:   Problem List Items Addressed This Visit    None    Visit Diagnoses    Influenza    -  Primary   Rapid flu neg, but sxs consistent. Pt opting to start tamiflu, will also send viscous lidocaine and tussionex. Precautions and supportive care reviewed   Relevant Medications   oseltamivir (TAMIFLU) 75 MG capsule  Other Relevant Orders   Influenza A & B (STAT)       Follow up plan: Return if symptoms worsen or fail to improve.

## 2017-12-22 NOTE — Patient Instructions (Signed)
Follow up as needed

## 2018-01-05 ENCOUNTER — Encounter: Payer: Self-pay | Admitting: Family Medicine

## 2018-01-05 ENCOUNTER — Ambulatory Visit: Payer: Medicaid Other | Admitting: Family Medicine

## 2018-01-05 VITALS — BP 112/72 | HR 80 | Wt 155.0 lb

## 2018-01-05 DIAGNOSIS — T148XXA Other injury of unspecified body region, initial encounter: Secondary | ICD-10-CM

## 2018-01-05 MED ORDER — TRIAMCINOLONE ACETONIDE 0.1 % EX CREA: 1.0000 "application " | TOPICAL_CREAM | Freq: Two times a day (BID) | CUTANEOUS | 0 refills | Status: DC

## 2018-01-05 NOTE — Progress Notes (Signed)
   BP 112/72   Pulse 80   Wt 155 lb (70.3 kg)   SpO2 98%   BMI 22.89 kg/m    Subjective:    Patient ID: Jessica Peters, female    DOB: Mar 05, 1981, 37 y.o.   MRN: [id number]  HPI: Jessica Peters is a 37 y.o. female  Chief Complaint  Patient presents with  . Rash    Possible shingles. Located right above tailbone  Patient with patches of excoriated skin concerned about possible shingles as her boss has had shingles a month ago.  Patient with no other lesions no other rash all doing well otherwise. This started 2 days ago with marked irritation and inflammation no known trauma.  No previous history of herpes.  Relevant past medical, surgical, family and social history reviewed and updated as indicated. Interim medical history since our last visit reviewed. Allergies and medications reviewed and updated.  Review of Systems  Per HPI unless specifically indicated above     Objective:    BP 112/72   Pulse 80   Wt 155 lb (70.3 kg)   SpO2 98%   BMI 22.89 kg/m   Wt Readings from Last 3 Encounters:  01/05/18 155 lb (70.3 kg)  12/22/17 158 lb 1.6 oz (71.7 kg)  10/08/17 159 lb 9 oz (72.4 kg)    Physical Exam  Skin:  Excoriated patch left gluteal crease area    Results for orders placed or performed in visit on 12/22/17  Influenza A & B (STAT)  Result Value Ref Range   Influenza A Negative Negative   Influenza B Negative Negative      Assessment & Plan:   Problem List Items Addressed This Visit    None    Visit Diagnoses    Skin abrasion    -  Primary    Discussed skin lesion care treatment triamcinolone creams to use sparingly for further problems will reevaluate.  Follow up plan: Return if symptoms worsen or fail to improve, for As scheduled.

## 2018-02-23 DIAGNOSIS — Z1371 Encounter for nonprocreative screening for genetic disease carrier status: Secondary | ICD-10-CM

## 2018-02-23 DIAGNOSIS — Z803 Family history of malignant neoplasm of breast: Secondary | ICD-10-CM

## 2018-02-23 DIAGNOSIS — Z9189 Other specified personal risk factors, not elsewhere classified: Secondary | ICD-10-CM

## 2018-02-23 HISTORY — DX: Family history of malignant neoplasm of breast: Z80.3

## 2018-02-23 HISTORY — DX: Other specified personal risk factors, not elsewhere classified: Z91.89

## 2018-02-23 HISTORY — DX: Encounter for nonprocreative screening for genetic disease carrier status: Z13.71

## 2018-03-03 NOTE — Progress Notes (Signed)
msg left for patient to cb and schedule annual exam

## 2018-03-09 ENCOUNTER — Encounter: Payer: Self-pay | Admitting: Obstetrics and Gynecology

## 2018-03-09 ENCOUNTER — Ambulatory Visit (INDEPENDENT_AMBULATORY_CARE_PROVIDER_SITE_OTHER): Payer: Medicaid Other | Admitting: Obstetrics and Gynecology

## 2018-03-09 VITALS — BP 118/74 | Ht 69.0 in | Wt 156.0 lb

## 2018-03-09 DIAGNOSIS — Z1339 Encounter for screening examination for other mental health and behavioral disorders: Secondary | ICD-10-CM

## 2018-03-09 DIAGNOSIS — Z01419 Encounter for gynecological examination (general) (routine) without abnormal findings: Secondary | ICD-10-CM

## 2018-03-09 DIAGNOSIS — Z1331 Encounter for screening for depression: Secondary | ICD-10-CM | POA: Diagnosis not present

## 2018-03-09 DIAGNOSIS — Z124 Encounter for screening for malignant neoplasm of cervix: Secondary | ICD-10-CM

## 2018-03-09 DIAGNOSIS — Z0000 Encounter for general adult medical examination without abnormal findings: Secondary | ICD-10-CM | POA: Diagnosis not present

## 2018-03-09 DIAGNOSIS — Z803 Family history of malignant neoplasm of breast: Secondary | ICD-10-CM

## 2018-03-09 NOTE — Progress Notes (Addendum)
Gynecology Annual Exam  PCP: Valerie Roys, DO  Chief Complaint  Patient presents with  . Annual Exam  . Gynecologic Exam   History of Present Illness:  [name] is a 37 y.o. [id number] who LMP was No LMP recorded. Patient has had an ablation., presents today for her annual examination.  Her menses are essentially absent since her ablation in 11/2015.  She has had occasional pink discharge recently with only very mild cramping.  She is sexually active without any issues.  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 There is no FH of ovarian cancer. The patient does do self-breast exams. Reviewed family history in detail.  Tobacco use: 5-10 cigs per day for 16 years. Alcohol use: social drinker Exercise: moderately active  The patient wears seatbelts: yes.      Past Medical History:  Diagnosis Date  . Allergy   . Anemia   . Anxiety   . Endometriosis   . Family history of breast cancer    My Risk cancer genetic testing letter sent 3/18  . Headache    h/o migraines  . Ovarian cyst   . Ovarian cyst   . PTSD (post-traumatic stress disorder)   . PTSD (post-traumatic stress disorder)   . Thrombocytopenia ([place])   . Vitamin D deficiency     Past Surgical History:  Procedure Laterality Date  . DILITATION & CURRETTAGE/HYSTROSCOPY WITH NOVASURE ABLATION  12/07/2015   Procedure: DILATATION & CURETTAGE/HYSTEROSCOPY WITH NOVASURE ABLATION;  Surgeon: Will Bonnet, MD;  Location: [facility] ORS;  Service: Gynecology;;  . MOUTH SURGERY    . TONSILLECTOMY    . TUBAL LIGATION      Prior to Admission medications   Medication Sig Start Date End Date Taking? Authorizing  Provider  BIOTIN PO Take 1 Dose by mouth daily.    [provider]   Allergies: No Known Allergies  Gynecologic History: No LMP recorded. Patient has had an ablation.  Obstetric History: [id number]  Social History   Socioeconomic History  . Marital status: Single    Spouse name: Not on file  . Number of children: Not on file  . Years of education: Not on file  . Highest education level: Not on file  Occupational History  . Not on file  Social Needs  . Financial resource strain: Not on file  . Food insecurity:    Worry: Not on file    Inability: Not on file  . Transportation needs:    Medical: Not on file    Non-medical: Not on file  Tobacco Use  . Smoking status: Current Every Day Smoker    Packs/day: 0.50    Years: 13.00    Pack years: 6.50    Types: Cigarettes  . Smokeless tobacco: Never Used  Substance and Sexual Activity  . Alcohol use: Yes    Alcohol/week: 3.6 oz    Types: 3 Glasses of wine, 3 Cans of beer per week    Comment: socially  . Drug use: No  . Sexual activity: Yes    Birth control/protection: Surgical  Lifestyle  . Physical activity:    Days per week: Not on file  Minutes per session: Not on file  . Stress: Not on file  Relationships  . Social connections:    Talks on phone: Not on file    Gets together: Not on file    Attends religious service: Not on file    Active member of club or organization: Not on file    Attends meetings of clubs or organizations: Not on file    Relationship status: Not on file  . Intimate partner violence:    Fear of current or ex partner: Not on file    Emotionally abused: Not on file    Physically abused: Not on file    Forced sexual activity: Not on file  Other Topics Concern  . Not on file  Social History Narrative  . Not on file    Family History  Problem Relation Age of Onset  . Breast cancer Paternal Aunt 50  . Cancer Paternal Aunt        Breast  . Breast cancer Paternal Grandmother 76  .  Stroke Paternal Grandmother   . Clotting disorder Paternal Grandmother   . Cancer Paternal Grandmother        Breast  . Depression Mother   . Depression Sister   . Alcohol abuse Maternal Grandmother   . Stroke Maternal Grandmother   . Depression Maternal Grandmother   . Cancer Maternal Grandmother        Liver  . Alcohol abuse Maternal Grandfather   . Stroke Maternal Grandfather   . Cancer Maternal Grandfather        Liver  . Stroke Paternal Grandfather   . Heart disease Paternal Grandfather     Review of Systems  Constitutional: Negative.        +Hot flashes  HENT: Negative.   Eyes: Negative.   Respiratory: Negative.   Cardiovascular: Positive for chest pain. Negative for palpitations, orthopnea, claudication, leg swelling and PND.  Gastrointestinal: Negative.   Genitourinary: Negative.   Musculoskeletal: Negative.   Skin: Negative.        +nipple discharge  Neurological: Positive for tingling, loss of consciousness and headaches. Negative for dizziness, tremors, sensory change, speech change, focal weakness, seizures and weakness.  Endo/Heme/Allergies: Bruises/bleeds easily.  Psychiatric/Behavioral: Negative.      Physical Exam BP 118/74   Ht 5\' 9"  (1.753 m)   Wt 156 lb (70.8 kg)   BMI 23.04 kg/m    Physical Exam  Constitutional: She is oriented to person, place, and time. She appears well-developed and well-nourished. No distress.  Genitourinary: Uterus normal. Pelvic exam was performed with patient supine. There is no rash, tenderness, lesion or injury on the right labia. There is no rash, tenderness, lesion or injury on the left labia. No erythema, tenderness or bleeding in the vagina. No signs of injury around the vagina. No vaginal discharge found. Right adnexum does not display mass, does not display tenderness and does not display fullness. Left adnexum does not display mass, does not display tenderness and does not display fullness. Cervix does not exhibit  motion tenderness, lesion, discharge or polyp.   Uterus is mobile and anteverted. Uterus is not enlarged, tender or exhibiting a mass.  HENT:  Head: Normocephalic and atraumatic.  Eyes: EOM are normal. No scleral icterus.  Neck: Normal range of motion. Neck supple. No thyromegaly present.  Cardiovascular: Normal rate and regular rhythm. Exam reveals no gallop and no friction rub.  No murmur heard. Pulmonary/Chest: Effort normal and breath sounds normal. No respiratory distress. She has no  wheezes. She has no rales. Right breast exhibits no inverted nipple, no mass, no nipple discharge, no skin change and no tenderness. Left breast exhibits no inverted nipple, no mass, no nipple discharge, no skin change and no tenderness.  Breast glandular tissue extremely dense bilaterally.   Abdominal: Soft. Bowel sounds are normal. She exhibits no distension and no mass. There is no tenderness. There is no rebound and no guarding.  Musculoskeletal: Normal range of motion. She exhibits no edema or tenderness.  Lymphadenopathy:    She has no cervical adenopathy.       Right: No inguinal adenopathy present.       Left: No inguinal adenopathy present.  Neurological: She is alert and oriented to person, place, and time. No cranial nerve deficit.  Skin: Skin is warm and dry. No rash noted. No erythema.  Psychiatric: She has a normal mood and affect. Her behavior is normal. Judgment normal.   Female chaperone present for pelvic and breast  portions of the physical exam  Results: AUDIT Questionnaire (screen for alcoholism): 4 PHQ-9: 2   Assessment: 37 y.o. [id number] female here for routine annual gynecologic examination.  Plan: Problem List Items Addressed This Visit      Other   Family history of breast cancer    Other Visit Diagnoses    Women's annual routine gynecological examination    -  Primary   Relevant Orders   IGP, Aptima HPV, rfx 16/18,45   Screening for depression       Screening for  alcoholism       Pap smear for cervical cancer screening       Relevant Orders   IGP, Aptima HPV, rfx 16/18,45      Screening: -- Blood pressure screen normal -- Colonoscopy - not due -- Mammogram - given family history, will await test results from genetic screening and Tyrerp-Cuzik model score. She will likely need a mammogram and/or an MRI this year.  Consideration for tomosythesis for mammography, if risk under 20% and MyRisk negative -- Weight screening: normal -- Depression screening negative (PHQ-9) -- Nutrition: normal -- cholesterol screening: not due for screening -- osteoporosis screening: not due -- tobacco screening: using: discussed quitting using the 5 A's -- alcohol screening: AUDIT questionnaire indicates low-risk usage. -- family history of breast cancer screening: done. High risk. See below. -- no evidence of domestic violence or intimate partner violence. -- STD screening: gonorrhea/chlamydia NAAT not collected per patient request. -- pap smear collected per ASCCP guidelines -- HPV vaccination series: not eligilbe  Family History of breast cancer: Discussed this at length. She is interested in testing. Discussed that greater than 90% of breast cancer is not related to genetic mutation. However, she does have a strong family component. Ideally, the affected relative would be test. However, both affected relatives are deceased. Will obtain the testing, which includes a Tyrer-Cuzik model score.  Consideration for, at a minimum, tomosynthesis evaluation of her breast starting this year given her strong family history. She voiced understanding of the potential need for increased surveillance, should her test results come back as abnormal. Even if normal, we will discuss increased surveillance in her circumstance.   20 minutes spent in face to face discussion with > 50% spent in counseling,management, and coordination of care of her family history of breast cancer.   Prentice Docker, MD 03/09/2018 5:29 PM

## 2018-03-12 LAB — IGP, APTIMA HPV, RFX 16/18,45
HPV APTIMA: NEGATIVE
PAP Smear Comment: 0

## 2018-03-16 ENCOUNTER — Ambulatory Visit (INDEPENDENT_AMBULATORY_CARE_PROVIDER_SITE_OTHER): Payer: Medicaid Other | Admitting: Obstetrics and Gynecology

## 2018-03-16 ENCOUNTER — Other Ambulatory Visit: Payer: Self-pay

## 2018-03-16 ENCOUNTER — Encounter: Payer: Self-pay | Admitting: Obstetrics and Gynecology

## 2018-03-16 ENCOUNTER — Encounter: Payer: Self-pay | Admitting: Medical Oncology

## 2018-03-16 ENCOUNTER — Emergency Department
Admission: EM | Admit: 2018-03-16 | Discharge: 2018-03-16 | Disposition: A | Discharge status: Home / Self Care | Payer: Medicaid Other | Attending: Student in an Organized Health Care Education/Training Program | Admitting: Student in an Organized Health Care Education/Training Program

## 2018-03-16 ENCOUNTER — Emergency Department: Payer: Medicaid Other

## 2018-03-16 DIAGNOSIS — M5441 Lumbago with sciatica, right side: Secondary | ICD-10-CM

## 2018-03-16 DIAGNOSIS — R1031 Right lower quadrant pain: Secondary | ICD-10-CM | POA: Diagnosis not present

## 2018-03-16 DIAGNOSIS — M5442 Lumbago with sciatica, left side: Secondary | ICD-10-CM | POA: Diagnosis not present

## 2018-03-16 DIAGNOSIS — F17210 Nicotine dependence, cigarettes, uncomplicated: Secondary | ICD-10-CM | POA: Insufficient documentation

## 2018-03-16 DIAGNOSIS — M545 Low back pain: Secondary | ICD-10-CM | POA: Diagnosis present

## 2018-03-16 DIAGNOSIS — R109 Unspecified abdominal pain: Secondary | ICD-10-CM | POA: Insufficient documentation

## 2018-03-16 DIAGNOSIS — M5430 Sciatica, unspecified side: Secondary | ICD-10-CM

## 2018-03-16 LAB — URINALYSIS, COMPLETE (UACMP) WITH MICROSCOPIC
Bilirubin Urine: NEGATIVE
GLUCOSE, UA: NEGATIVE mg/dL
Hgb urine dipstick: NEGATIVE
Ketones, ur: NEGATIVE mg/dL
Leukocytes, UA: NEGATIVE
Nitrite: NEGATIVE
PH: 6 (ref 5.0–8.0)
Protein, ur: NEGATIVE mg/dL
SPECIFIC GRAVITY, URINE: 1.023 (ref 1.005–1.030)

## 2018-03-16 LAB — COMPREHENSIVE METABOLIC PANEL
ALBUMIN: 4.2 g/dL (ref 3.5–5.0)
ALK PHOS: 22 U/L — AB (ref 38–126)
ALT: 14 U/L (ref 14–54)
ANION GAP: 4 — AB (ref 5–15)
AST: 17 U/L (ref 15–41)
BILIRUBIN TOTAL: 0.8 mg/dL (ref 0.3–1.2)
BUN: 16 mg/dL (ref 6–20)
CALCIUM: 8.9 mg/dL (ref 8.9–10.3)
CO2: 28 mmol/L (ref 22–32)
Chloride: 106 mmol/L (ref 101–111)
Creatinine, Ser: 0.68 mg/dL (ref 0.44–1.00)
GFR calc Af Amer: >60 mL/min (ref >60)
GFR calc non Af Amer: >60 mL/min (ref >60)
GLUCOSE: 104 mg/dL — AB (ref 65–99)
POTASSIUM: 4.2 mmol/L (ref 3.5–5.1)
Sodium: 138 mmol/L (ref 135–145)
TOTAL PROTEIN: 6.8 g/dL (ref 6.5–8.1)

## 2018-03-16 LAB — CBC
HCT: 41.8 % (ref 35.0–47.0)
Hemoglobin: 14.1 g/dL (ref 12.0–16.0)
MCH: 30.6 pg (ref 26.0–34.0)
MCHC: 33.8 g/dL (ref 32.0–36.0)
MCV: 90.5 fL (ref 80.0–100.0)
PLATELETS: 132 10*3/uL — AB (ref 150–440)
RBC: 4.62 MIL/uL (ref 3.80–5.20)
RDW: 12.8 % (ref 11.5–14.5)
WBC: 6 10*3/uL (ref 3.6–11.0)

## 2018-03-16 LAB — POCT PREGNANCY, URINE: Preg Test, Ur: NEGATIVE

## 2018-03-16 LAB — LIPASE, BLOOD: Lipase: 24 U/L (ref 11–51)

## 2018-03-16 IMAGING — CT CT ABD-PELV W/ CM
2 of 4 series · 16 of 46 positions shown, 18 images · IV contrast (APPLIED)
Comparison: None.

CLINICAL DATA: Acute left lower quadrant abdominal pain.

EXAM:
CT ABDOMEN AND PELVIS WITH CONTRAST
TECHNIQUE: Multidetector CT imaging of the abdomen and pelvis was performed
using the standard protocol following bolus administration of
intravenous contrast.
CONTRAST:  100mL [DS] IOPAMIDOL ([DS]) INJECTION 61%

[Series 2: routine abd/pel with · axial · 0.66mm/px · z∈[-482,-42]mm · 13 of 96 slices shown, 15 images]
[im 4/96  soft-tissue]
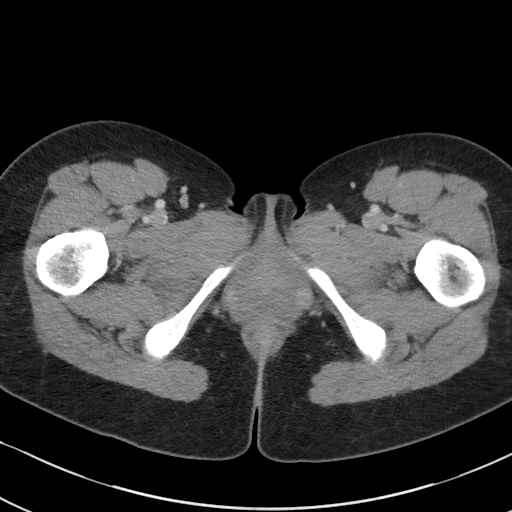
[im 4/96  bone]
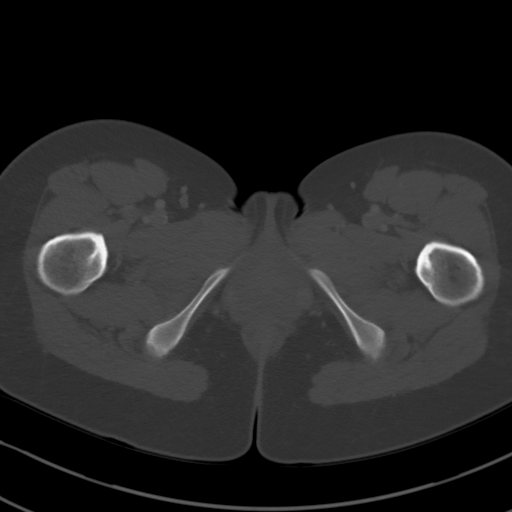
[im 12/96  soft-tissue]
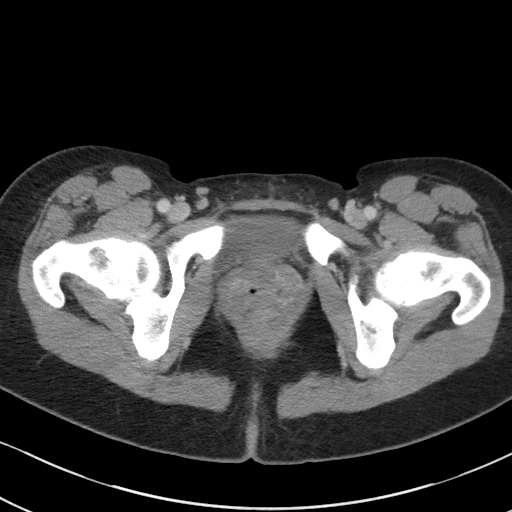
[im 20/96  soft-tissue]
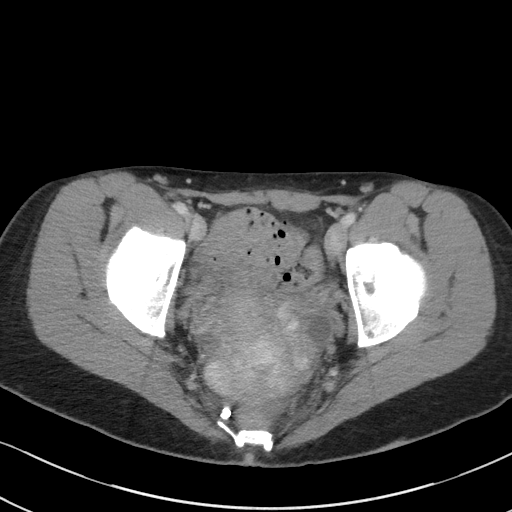
[im 27/96  soft-tissue]
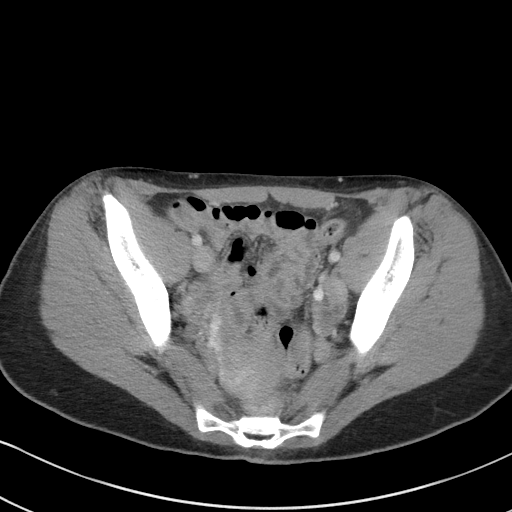
[im 35/96  soft-tissue]
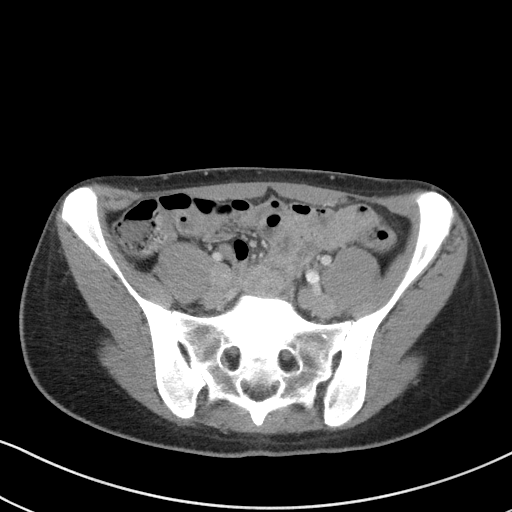
[im 42/96  soft-tissue]
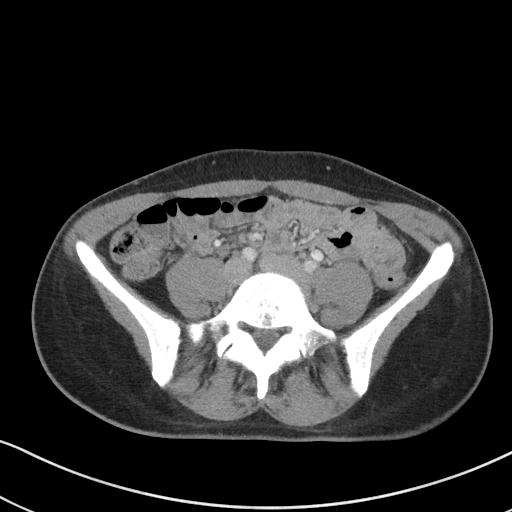
[im 50/96  soft-tissue]
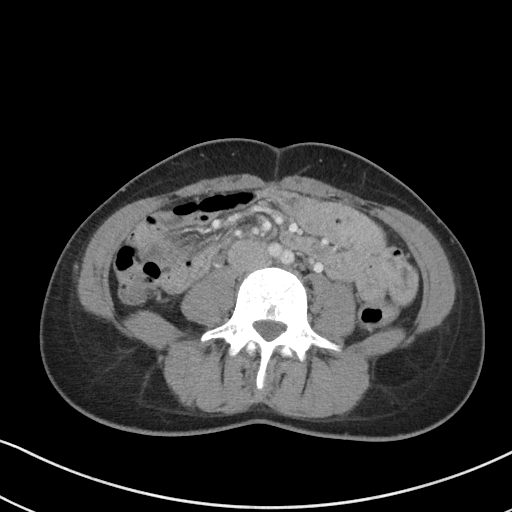
[im 54/96  soft-tissue]
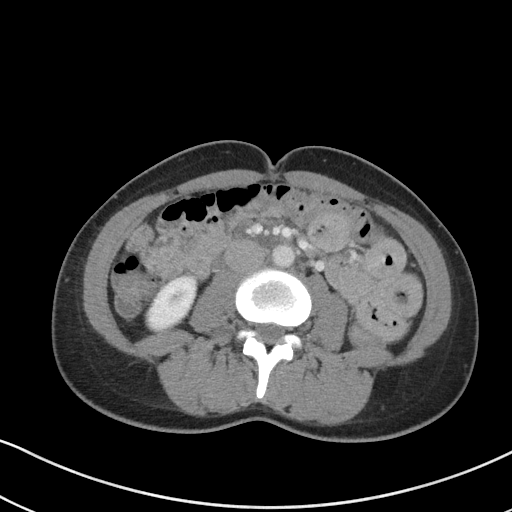
[im 61/96  soft-tissue]
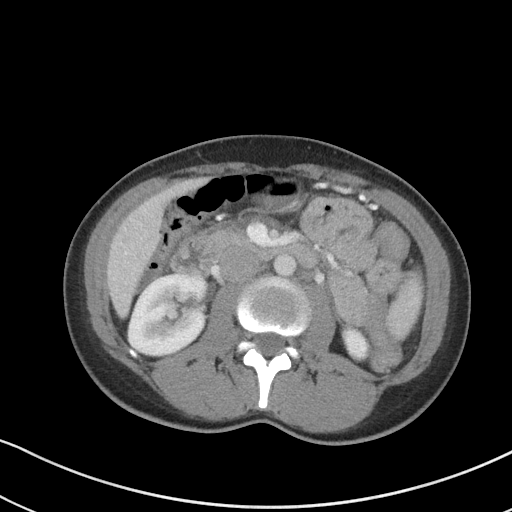
[im 61/96  bone]
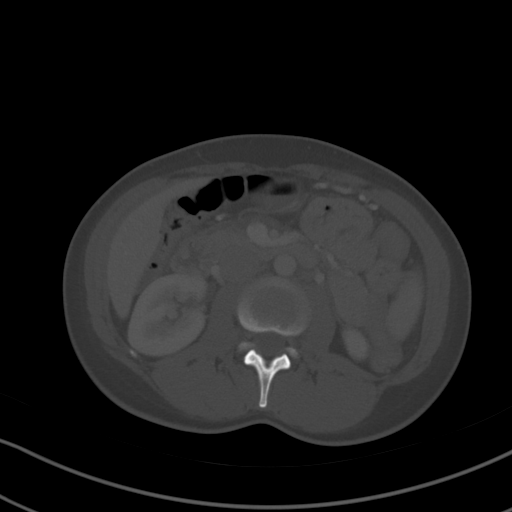
[im 69/96  soft-tissue]
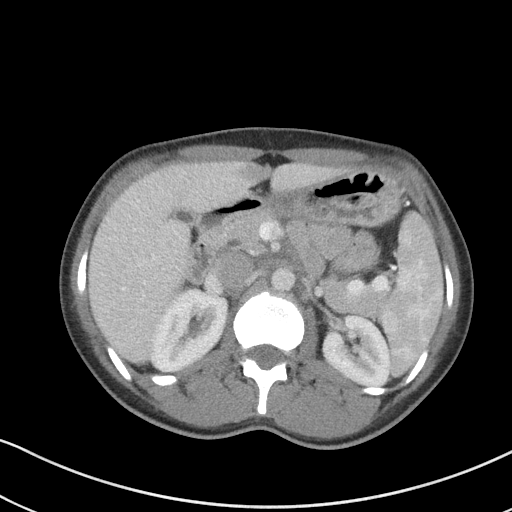
[im 77/96  soft-tissue]
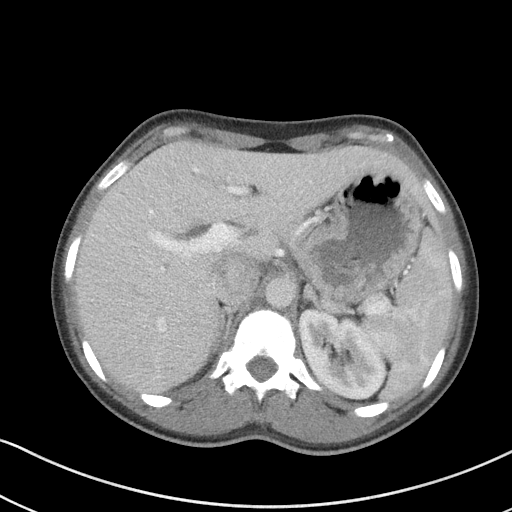
[im 84/96  soft-tissue]
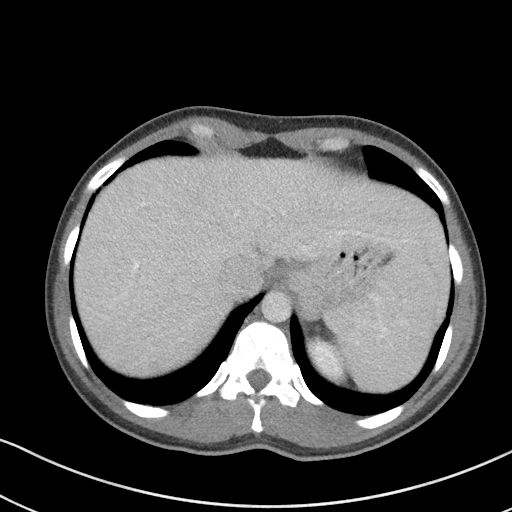
[im 92/96  soft-tissue]
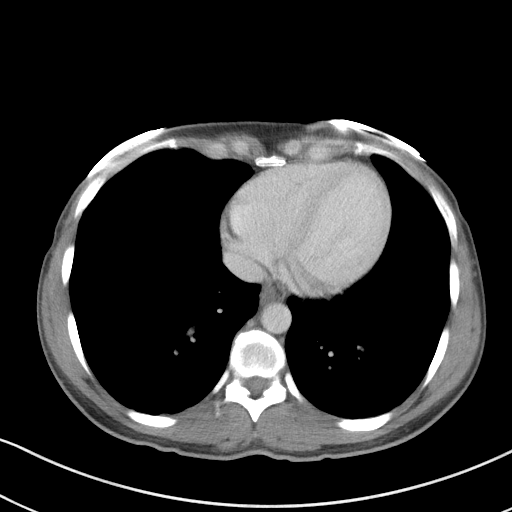

[Series 5: coronal st · coronal · 0.62mm/px · 3 of 81 slices shown]
[im 27/81  soft-tissue]
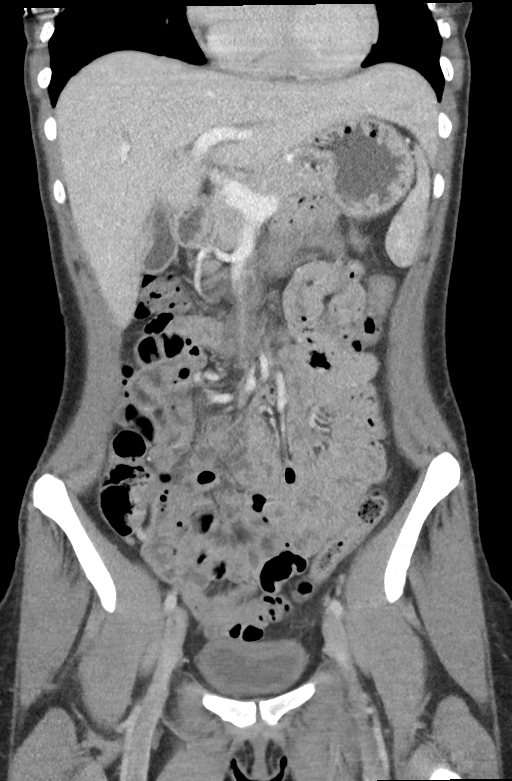
[im 36/81  soft-tissue]
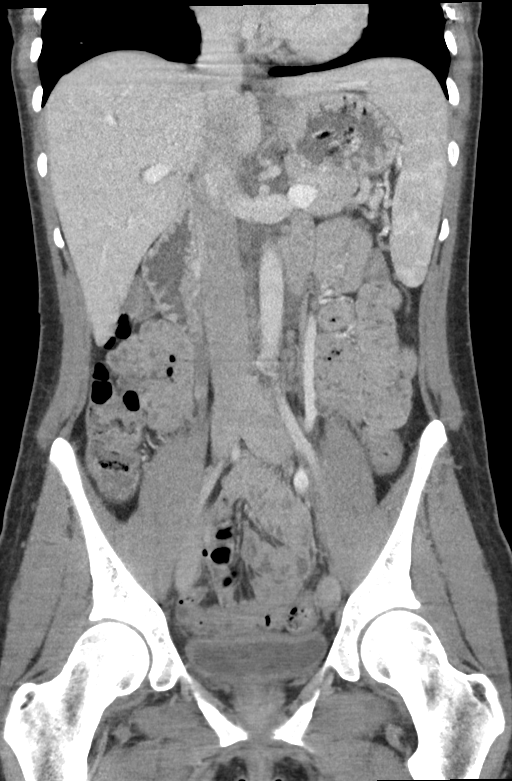
[im 45/81  soft-tissue]
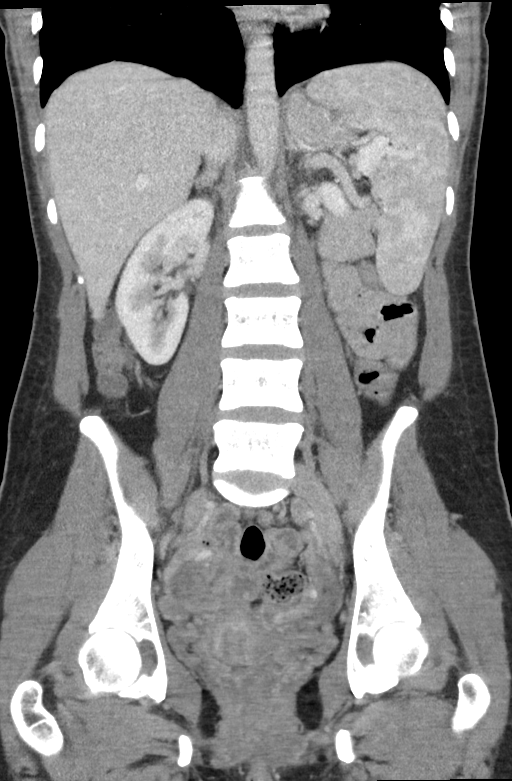

[16 of 46 positions shown; findings below may reference images not displayed]

FINDINGS: Lower chest: No acute abnormality.

Hepatobiliary: No focal liver abnormality is seen. No gallstones,
gallbladder wall thickening, or biliary dilatation.

Pancreas: Unremarkable. No pancreatic ductal dilatation or
surrounding inflammatory changes.

Spleen: Normal in size without focal abnormality.

Adrenals/Urinary Tract: Adrenal glands are unremarkable. Kidneys are
normal, without renal calculi, focal lesion, or hydronephrosis.
Bladder is unremarkable.

Stomach/Bowel: Stomach is within normal limits. Appendix appears
normal. No evidence of bowel wall thickening, distention, or
inflammatory changes.

Vascular/Lymphatic: No significant vascular findings are present. No
enlarged abdominal or pelvic lymph nodes.

Reproductive: Ovaries are unremarkable. Multiple probable small
uterine fibroids are noted.

Other: No abdominal wall hernia or abnormality. No abdominopelvic
ascites.

Musculoskeletal: No acute or significant osseous findings.
IMPRESSION: Probable fibroid uterus. No other abnormality seen in the abdomen or
pelvis.

## 2018-03-16 IMAGING — CT CT L SPINE W/O CM
3 of 10 series · 11 of 33 positions shown, 13 images · IV contrast (APPLIED)
Comparison: None

CLINICAL DATA: Sciatica, side not specified

EXAM:
CT Thoracic Spine  Contrast
CT LUMBAR SPINE reformats with CONTRAST
TECHNIQUE: Multiplanar CT images of the lumbar spine were reconstructed from
contemporary CT of the abdomen.
CONTRAST:  No additional

[Series 7: sag bone · sagittal · 0.46mm/px · 5 of 52 slices shown, 6 images]
[im 18/52  bone]
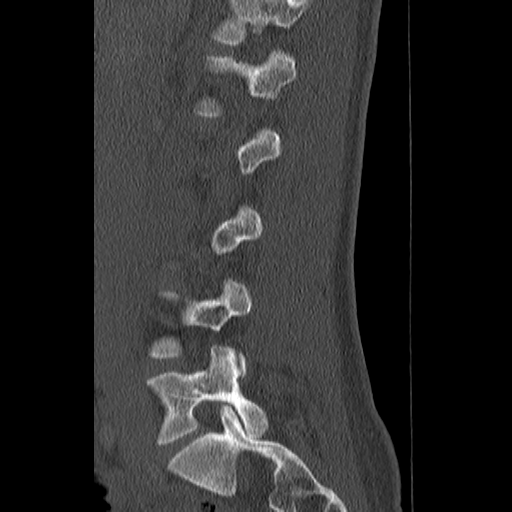
[im 22/52  bone]
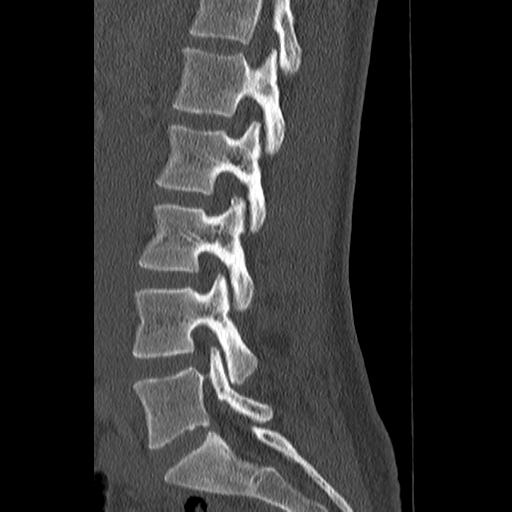
[im 26/52  soft-tissue]
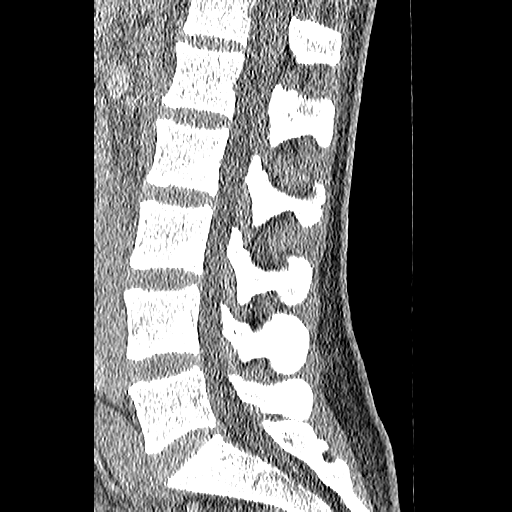
[im 26/52  bone]
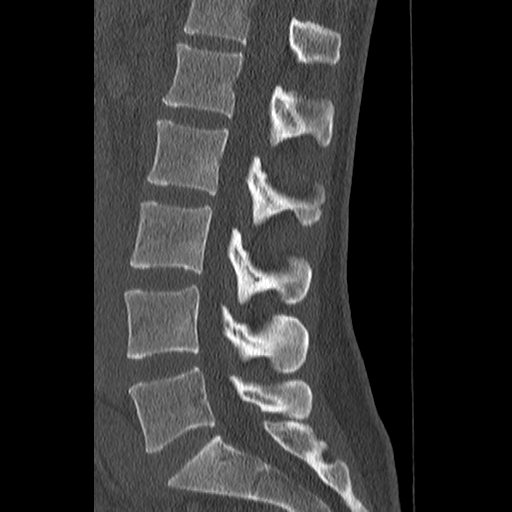
[im 30/52  bone]
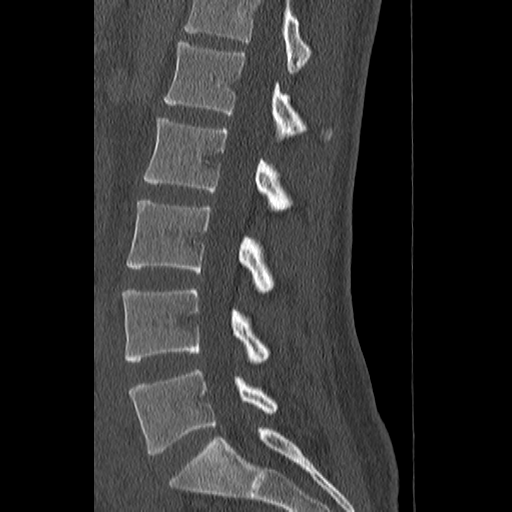
[im 35/52  bone]
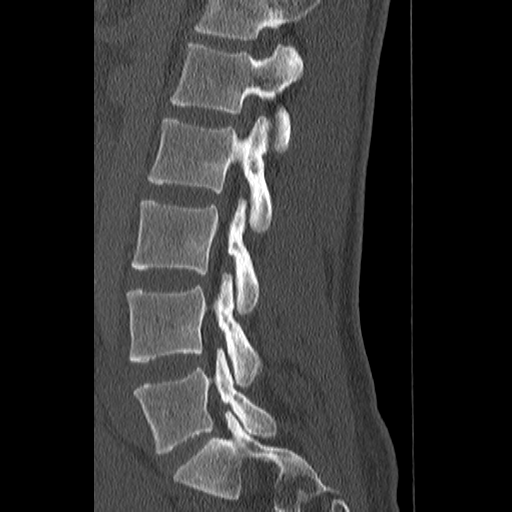

[Series 8: cor bone · coronal · 0.46mm/px · 3 of 54 slices shown]
[im 11/54  bone]
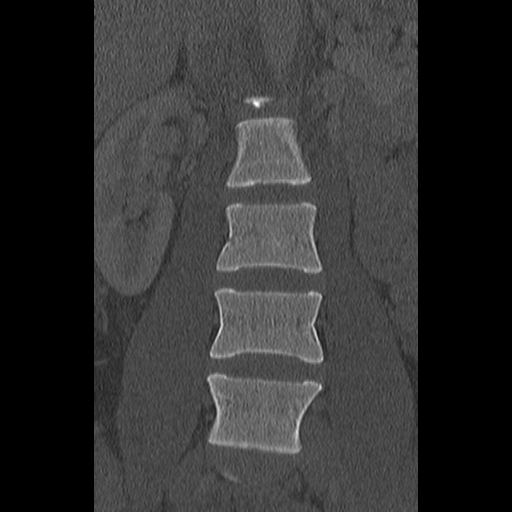
[im 22/54  bone]
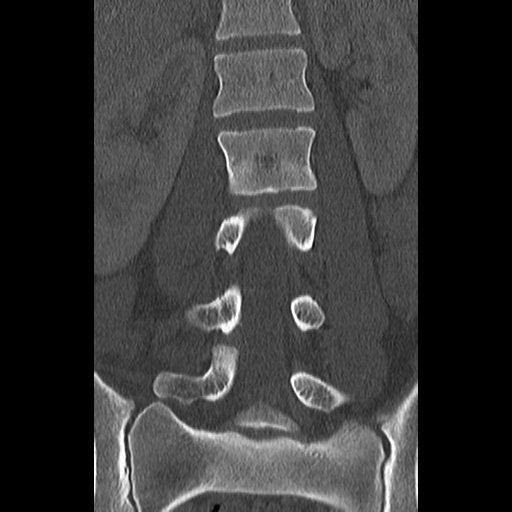
[im 32/54  bone]
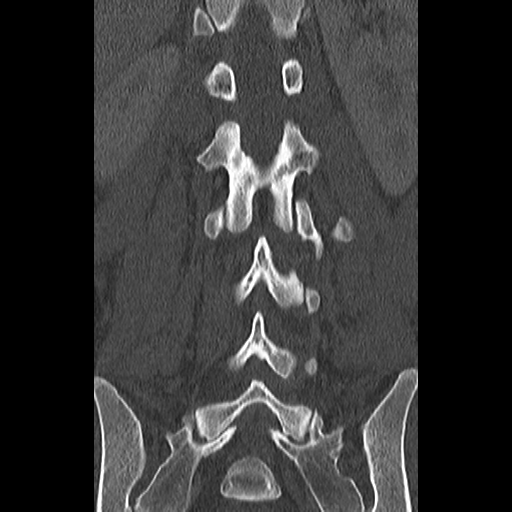

[Series 10: st axial lumber 2's · axial · 0.46mm/px · z∈[-280,-166]mm · 3 of 115 slices shown, 4 images]
[im 29/115  soft-tissue]
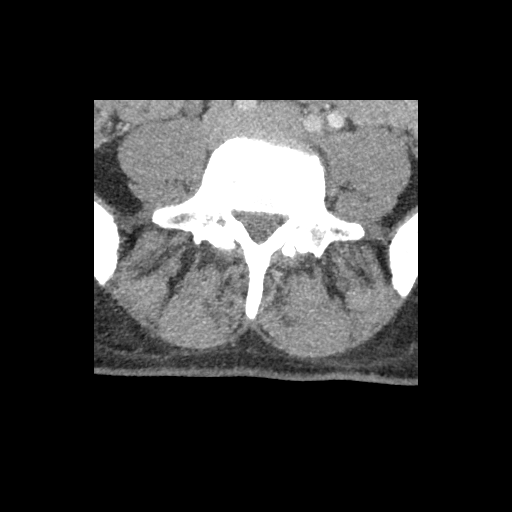
[im 29/115  bone]
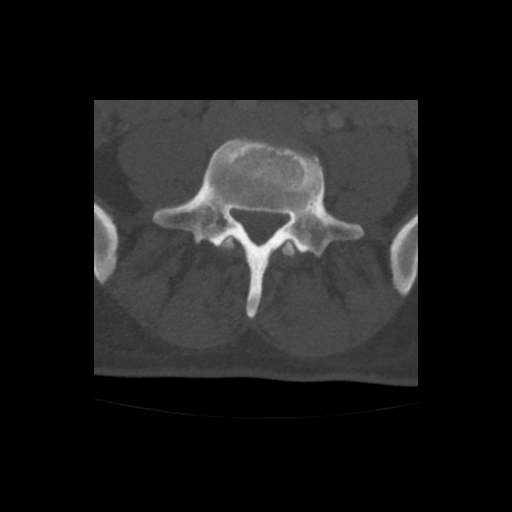
[im 58/115  bone]
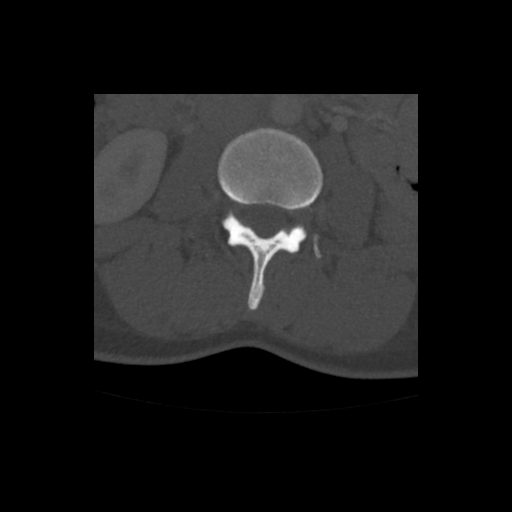
[im 86/115  bone]
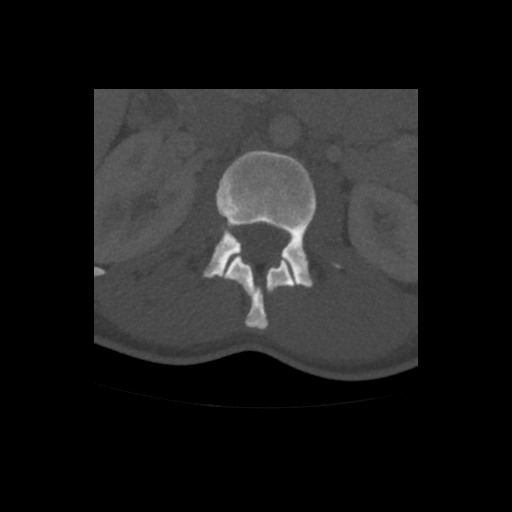

[11 of 33 positions shown; findings below may reference images not displayed]

FINDINGS: Segmentation: 5 lumbar type vertebral bodies.

Alignment: Mild lumbar levocurvature which could be positional. No
listhesis.

Vertebrae: No acute fracture or focal pathologic process.

Paraspinal and other soft tissues: Abdominal CT reported separately.
No perispinal inflammation or masslike finding.

Disc levels: Good disc height. Small central disc protrusion at
L5-S1 without visible impingement. No facet degeneration. No visible
impingement.
IMPRESSION: 1. No acute osseous finding.  No visible impingement.
2. L5-S1 small disc protrusion.

## 2018-03-16 MED ORDER — MORPHINE SULFATE (PF) 4 MG/ML IV SOLN
4.0000 mg | INTRAVENOUS | Status: DC | PRN
  Administered 2018-03-16: 4 mg via INTRAVENOUS
  Filled 2018-03-16: qty 1

## 2018-03-16 MED ORDER — IOPAMIDOL (ISOVUE-300) INJECTION 61%
100.0000 mL | Freq: Once | INTRAVENOUS | Status: AC | PRN
  Administered 2018-03-16: 100 mL via INTRAVENOUS
  Filled 2018-03-16: qty 100

## 2018-03-16 MED ORDER — HYDROCODONE-ACETAMINOPHEN 5-325 MG PO TABS: 1.0000 | ORAL_TABLET | ORAL | 0 refills | Status: DC | PRN

## 2018-03-16 MED ORDER — KETOROLAC TROMETHAMINE 30 MG/ML IJ SOLN
15.0000 mg | Freq: Once | INTRAMUSCULAR | Status: AC
  Administered 2018-03-16: 15 mg via INTRAVENOUS
  Filled 2018-03-16: qty 1

## 2018-03-16 MED ORDER — CYCLOBENZAPRINE HCL 10 MG PO TABS: 10.0000 mg | ORAL_TABLET | Freq: Three times a day (TID) | ORAL | 0 refills | Status: DC | PRN

## 2018-03-16 MED ORDER — PROMETHAZINE HCL 25 MG/ML IJ SOLN
12.5000 mg | Freq: Four times a day (QID) | INTRAMUSCULAR | Status: DC | PRN
  Administered 2018-03-16: 12.5 mg via INTRAVENOUS
  Filled 2018-03-16: qty 1

## 2018-03-16 MED ORDER — NAPROXEN 500 MG PO TABS: 500.0000 mg | ORAL_TABLET | Freq: Two times a day (BID) | ORAL | 0 refills | Status: DC

## 2018-03-16 NOTE — ED Notes (Signed)
Sig pad not working, inst given to patient.  

## 2018-03-16 NOTE — ED Triage Notes (Signed)
Pt reports she began having rt lower abd/pelvic pain Thursday. Pt was seen at ob/gyn this am and told to come here to get pain relief. Pt denies vaginal bleeding.

## 2018-03-16 NOTE — ED Provider Notes (Signed)
[facility] Emergency Department Provider Note    First MD Initiated Contact with Patient 03/16/18 1403     (approximate)  I have reviewed the triage vital signs and the nursing notes.   HISTORY  Chief Complaint Pelvic Pain    HPI Jessica Peters is a 37 y.o. female a history of endometriosis and ovarian cysts resents to the ER today with chief complaint of low back pain initially started several days ago primarily on the left side with pain shooting down the posterior aspect of her left leg worse with movement but is also started developing pain on the right side in similar fashion.  She started having right-sided abdominal pain today associated with the back pain so she went to her OB/GYN.  Pelvic exam was unremarkable.  She is directed to the ER due to her for pain for further evaluation.  Denies any nausea or vomiting.  Denies any trauma.  No loss of control of her bladder or bowels.  Denies any saddle anesthesia.  Past Medical History:  Diagnosis Date  . Allergy   . Anemia   . Anxiety   . Endometriosis   . Family history of breast cancer    My Risk cancer genetic testing letter sent 3/18  . Headache    h/o migraines  . Ovarian cyst   . Ovarian cyst   . PTSD (post-traumatic stress disorder)   . PTSD (post-traumatic stress disorder)   . Thrombocytopenia ([place])   . Vitamin D deficiency    Family History  Problem Relation Age of Onset  . Breast cancer Paternal Aunt 21  . Cancer Paternal Aunt        Breast  . Breast cancer Paternal Grandmother 91  . Stroke Paternal Grandmother   . Clotting disorder Paternal Grandmother   . Cancer Paternal Grandmother        Breast  . Depression Mother   . Depression Sister   . Alcohol abuse Maternal Grandmother   . Stroke Maternal Grandmother   . Depression Maternal Grandmother   . Cancer Maternal Grandmother        Liver  . Alcohol abuse Maternal Grandfather   . Stroke Maternal Grandfather   . Cancer  Maternal Grandfather        Liver  . Stroke Paternal Grandfather   . Heart disease Paternal Grandfather    Past Surgical History:  Procedure Laterality Date  . DILITATION & CURRETTAGE/HYSTROSCOPY WITH NOVASURE ABLATION  12/07/2015   Procedure: DILATATION & CURETTAGE/HYSTEROSCOPY WITH NOVASURE ABLATION;  Surgeon: Will Bonnet, MD;  Location: [facility] ORS;  Service: Gynecology;;  . MOUTH SURGERY    . TONSILLECTOMY    . TUBAL LIGATION     Patient Active Problem List   Diagnosis Date Noted  . Allergic rhinitis 10/03/2017  . Major depression, recurrent, chronic ([place]) 10/03/2017  . Anxiety 06/13/2017  . Vitamin D deficiency   . Family history of breast cancer   . Breast lump on right side at 1 o'clock position 02/03/2017      Prior to Admission medications   Medication Sig Start Date End Date Taking? Authorizing Provider  BIOTIN PO Take 1 Dose by mouth daily.    [provider]    Allergies Patient has no known allergies.    Social History Social History   Tobacco Use  . Smoking status: Current Every Day Smoker    Packs/day: 0.50    Years: 13.00    Pack years: 6.50  Types: Cigarettes  . Smokeless tobacco: Never Used  Substance Use Topics  . Alcohol use: Yes    Alcohol/week: 3.6 oz    Types: 3 Glasses of wine, 3 Cans of beer per week    Comment: socially  . Drug use: No    Review of Systems Patient denies headaches, rhinorrhea, blurry vision, numbness, shortness of breath, chest pain, edema, cough, abdominal pain, nausea, vomiting, diarrhea, dysuria, fevers, rashes or hallucinations unless otherwise stated above in HPI. ____________________________________________   PHYSICAL EXAM:  VITAL SIGNS: Vitals:   03/16/18 1206 03/16/18 1510  BP: (!) 128/96 (!) 108/51  Pulse: 79 65  Resp: 17 18  Temp: 98.7 F (37.1 C) 98.7 F (37.1 C)  SpO2: 100% 100%    Constitutional: Alert and oriented. Well appearing and in no acute distress. Eyes: Conjunctivae  are normal.  Head: Atraumatic. Nose: No congestion/rhinnorhea. Mouth/Throat: Mucous membranes are moist.   Neck: No stridor. Painless ROM.  Cardiovascular: Normal rate, regular rhythm. Grossly normal heart sounds.  Good peripheral circulation. Respiratory: Normal respiratory effort.  No retractions. Lungs CTAB. Gastrointestinal: Soft and nontender. No distention. No abdominal bruits. No CVA tenderness. Genitourinary:  Musculoskeletal: Positive straight leg test left greater than right.  Reflexes are 2+ and equal bilaterally.  Good strength.  Sensation intact distally.  Downward going [place] no joint effusions. Neurologic:  Normal speech and language. No gross focal neurologic deficits are appreciated. No facial droop Skin:  Skin is warm, dry and intact. No rash noted. Psychiatric: Mood and affect are normal. Speech and behavior are normal.  ____________________________________________   LABS (all labs ordered are listed, but only abnormal results are displayed)  Results for orders placed or performed during the hospital encounter of 03/16/18 (from the past 24 hour(s))  Lipase, blood     Status: None   Collection Time: 03/16/18 12:07 PM  Result Value Ref Range   Lipase 24 11 - 51 U/L  Comprehensive metabolic panel     Status: Abnormal   Collection Time: 03/16/18 12:07 PM  Result Value Ref Range   Sodium 138 135 - 145 mmol/L   Potassium 4.2 3.5 - 5.1 mmol/L   Chloride 106 101 - 111 mmol/L   CO2 28 22 - 32 mmol/L   Glucose, Bld 104 (H) 65 - 99 mg/dL   BUN 16 6 - 20 mg/dL   Creatinine, Ser 0.68 0.44 - 1.00 mg/dL   Calcium 8.9 8.9 - 10.3 mg/dL   Total Protein 6.8 6.5 - 8.1 g/dL   Albumin 4.2 3.5 - 5.0 g/dL   AST 17 15 - 41 U/L   ALT 14 14 - 54 U/L   Alkaline Phosphatase 22 (L) 38 - 126 U/L   Total Bilirubin 0.8 0.3 - 1.2 mg/dL   GFR calc non Af Amer >60 >60 mL/min   GFR calc Af Amer >60 >60 mL/min   Anion gap 4 (L) 5 - 15  CBC     Status: Abnormal   Collection Time:  03/16/18 12:07 PM  Result Value Ref Range   WBC 6.0 3.6 - 11.0 K/uL   RBC 4.62 3.80 - 5.20 MIL/uL   Hemoglobin 14.1 12.0 - 16.0 g/dL   HCT 41.8 35.0 - 47.0 %   MCV 90.5 80.0 - 100.0 fL   MCH 30.6 26.0 - 34.0 pg   MCHC 33.8 32.0 - 36.0 g/dL   RDW 12.8 11.5 - 14.5 %   Platelets 132 (L) 150 - 440 K/uL  Urinalysis, Complete w Microscopic  Status: Abnormal   Collection Time: 03/16/18 12:07 PM  Result Value Ref Range   Color, Urine YELLOW (A) YELLOW   APPearance CLEAR (A) CLEAR   Specific Gravity, Urine 1.023 1.005 - 1.030   pH 6.0 5.0 - 8.0   Glucose, UA NEGATIVE NEGATIVE mg/dL   Hgb urine dipstick NEGATIVE NEGATIVE   Bilirubin Urine NEGATIVE NEGATIVE   Ketones, ur NEGATIVE NEGATIVE mg/dL   Protein, ur NEGATIVE NEGATIVE mg/dL   Nitrite NEGATIVE NEGATIVE   Leukocytes, UA NEGATIVE NEGATIVE   RBC / HPF 0-5 0 - 5 RBC/hpf   WBC, UA 0-5 0 - 5 WBC/hpf   Bacteria, UA RARE (A) NONE SEEN   Squamous Epithelial / LPF 0-5 (A) NONE SEEN   Mucus PRESENT   Pregnancy, urine POC     Status: None   Collection Time: 03/16/18 12:26 PM  Result Value Ref Range   Preg Test, Ur NEGATIVE NEGATIVE   ____________________________________________ ____________________________________________  RADIOLOGY  I personally reviewed all radiographic images ordered to evaluate for the above acute complaints and reviewed radiology reports and findings.  These findings were personally discussed with the patient.  Please see medical record for radiology report.  ____________________________________________   PROCEDURES  Procedure(s) performed:  Procedures    Critical Care performed: no ____________________________________________   INITIAL IMPRESSION / ASSESSMENT AND PLAN / ED COURSE  Pertinent labs & imaging results that were available during my care of the patient were reviewed by me and considered in my medical decision making (see chart for details).  DDX: Sciatica, herniated disc, cauda equina,  spinal stenosis, she is skeletal pain, appendicitis, hernia, stone, ovarian mass   Jessica Peters is a 37 y.o. who presents to the ED with No history of injury or trauma. No recent back instrumentation/procedures. No fevers. Denies cord compression symptoms. No bowel/bladder incontinence or retention, no LE weakness. VSS in ED. Exam with no LE weakness bilat., no sensory deficits, normal DTRs, no clonus, no saddle anesthesia.   History and physical exam less consistent with kidney stone or pyelonephritis.  Given RLQ pain will order CT imaging to further characterize and exclude appendicitis.  Treatments will include observation, analgesia, and arrange appropriate follow up for recheck. Clinical picture is not consistent with epidural abscess  Clinical Course as of Mar 16 1633  Mon Mar 16, 2018  1608 Patient with improvement in symptoms.  Does have some disc protrusion and symptoms are clinically consistent with sciatica.  No evidence of cauda equina or spinal stenosis.  No evidence of appendicitis or acute pelvic pathology at this time.  Do feel patient is stable and appropriate for outpatient referral.   [PR]    Clinical Course User Index [PR] Merlyn Lot, MD     As part of my medical decision making, I reviewed the following data within the [facility] notes reviewed and incorporated, Labs reviewed, notes from prior ED visits and [place] Controlled Substance Database   ____________________________________________   FINAL CLINICAL IMPRESSION(S) / ED DIAGNOSES  Final diagnoses:  Sciatica  Acute bilateral low back pain with bilateral sciatica      NEW MEDICATIONS STARTED DURING THIS VISIT:  New Prescriptions   No medications on file     Note:  This document was prepared using Dragon voice recognition software and may include unintentional dictation errors.    Merlyn Lot, MD 03/16/18 [phone]

## 2018-03-16 NOTE — Discharge Instructions (Signed)

## 2018-03-16 NOTE — Progress Notes (Signed)
Obstetrics & Gynecology Office Visit   Chief Complaint  Patient presents with  . Pelvic Pain    right side pain shooting down legs   History of Present Illness: 37 y.o. [id number] with acute onset of severe pain in her left lower abdomen.  4 days ago she began having pain shooting down her left leg. The next day she bregan having pain shooting down her right leg.  The pain in her left leg was on her posterior thigh, extending down all the way to her left foot.  The next day she began also having the same type of pain on her right side.  The pain was constant.  The pain was a sharp, crampy pain. The pain was 8/10.  Two days ago she began having pain on her right lower quadrant.  She laid in the bed all day yesterday.  She has tried a heating pad, tylenol, ibuprofen.  She tried Advil PM to try to sleep last night, but this didn't work.  She continues to have the pain in her legs, but at a diminished level.  The pain in her abdomen does not radiate, it is in her right suprapubic area.  Nothing makes the pain better. Nothing makes it worse.  She associates nausea and emesis with her pain.  She denies fevers, chills She denies diarrhea, constipation.  The pain is not affected by eating. She is unable to keep much down now. She has no appetite.  She denies hematochezia, melena.  She denies hematuria, or any urinary symptoms.  She denies vaginal symptoms. She denies sick contacts.  She has not had this pain before.  She has seen no other providers for this.  Past Medical History:  Diagnosis Date  . Allergy   . Anemia   . Anxiety   . Endometriosis   . Family history of breast cancer    My Risk cancer genetic testing letter sent 3/18  . Headache    h/o migraines  . Ovarian cyst   . Ovarian cyst   . PTSD (post-traumatic stress disorder)   . PTSD (post-traumatic stress disorder)   . Thrombocytopenia ([place])   . Vitamin D deficiency     Past Surgical History:  Procedure Laterality Date  . DILITATION &  CURRETTAGE/HYSTROSCOPY WITH NOVASURE ABLATION  12/07/2015   Procedure: DILATATION & CURETTAGE/HYSTEROSCOPY WITH NOVASURE ABLATION;  Surgeon: Will Bonnet, MD;  Location: [facility] ORS;  Service: Gynecology;;  . MOUTH SURGERY    . TONSILLECTOMY    . TUBAL LIGATION      Gynecologic History: No LMP recorded. Patient has had an ablation.  Obstetric History: [id number]  Family History  Problem Relation Age of Onset  . Breast cancer Paternal Aunt 62  . Cancer Paternal Aunt        Breast  . Breast cancer Paternal Grandmother 63  . Stroke Paternal Grandmother   . Clotting disorder Paternal Grandmother   . Cancer Paternal Grandmother        Breast  . Depression Mother   . Depression Sister   . Alcohol abuse Maternal Grandmother   . Stroke Maternal Grandmother   . Depression Maternal Grandmother   . Cancer Maternal Grandmother        Liver  . Alcohol abuse Maternal Grandfather   . Stroke Maternal Grandfather   . Cancer Maternal Grandfather        Liver  . Stroke Paternal Grandfather   . Heart disease Paternal Grandfather     Social History  Socioeconomic History  . Marital status: Single    Spouse name: Not on file  . Number of children: Not on file  . Years of education: Not on file  . Highest education level: Not on file  Occupational History  . Not on file  Social Needs  . Financial resource strain: Not on file  . Food insecurity:    Worry: Not on file    Inability: Not on file  . Transportation needs:    Medical: Not on file    Non-medical: Not on file  Tobacco Use  . Smoking status: Current Every Day Smoker    Packs/day: 0.50    Years: 13.00    Pack years: 6.50    Types: Cigarettes  . Smokeless tobacco: Never Used  Substance and Sexual Activity  . Alcohol use: Yes    Alcohol/week: 3.6 oz    Types: 3 Glasses of wine, 3 Cans of beer per week    Comment: socially  . Drug use: No  . Sexual activity: Yes    Birth control/protection: Surgical  Lifestyle  .  Physical activity:    Days per week: Not on file    Minutes per session: Not on file  . Stress: Not on file  Relationships  . Social connections:    Talks on phone: Not on file    Gets together: Not on file    Attends religious service: Not on file    Active member of club or organization: Not on file    Attends meetings of clubs or organizations: Not on file    Relationship status: Not on file  . Intimate partner violence:    Fear of current or ex partner: Not on file    Emotionally abused: Not on file    Physically abused: Not on file    Forced sexual activity: Not on file  Other Topics Concern  . Not on file  Social History Narrative  . Not on file    No Known Allergies  Prior to Admission medications   Medication Sig Start Date End Date Taking? Authorizing Provider  BIOTIN PO Take 1 Dose by mouth daily.    [provider]    Review of Systems  Constitutional: Negative for chills, diaphoresis, fever, malaise/fatigue and weight loss.  HENT: Negative.   Eyes: Negative.   Respiratory: Negative.   Cardiovascular: Negative.   Gastrointestinal: Positive for abdominal pain, nausea and vomiting. Negative for blood in stool, constipation, diarrhea, heartburn and melena.  Genitourinary: Negative.   Musculoskeletal: Negative.        Right and left leg posterior pain and thigh pain  Skin: Negative.   Neurological: Negative.   Endo/Heme/Allergies: Negative.   Psychiatric/Behavioral: Negative.      Physical Exam BP 118/74   Ht 5\' 9"  (1.753 m)   Wt 156 lb (70.8 kg)   BMI 23.04 kg/m  No LMP recorded. Patient has had an ablation. Physical Exam  Constitutional: She is oriented to person, place, and time. She appears well-developed and well-nourished. Distressed: mild-moderate distress.  Genitourinary: Pelvic exam was performed with patient supine. There is no rash, tenderness or lesion on the right labia. There is no rash, tenderness or lesion on the left labia.    Right adnexum displays tenderness (R<L). Right adnexum does not display mass and does not display fullness.  Left adnexum displays tenderness. Left adnexum does not display mass and does not display fullness. Cervix does not exhibit motion tenderness.   Uterus is tender (mild  ttp). Uterus is not enlarged, exhibiting a mass or mobile.  HENT:  Head: Normocephalic and atraumatic.  Eyes: Conjunctivae are normal. No scleral icterus.  Neck: Normal range of motion. Neck supple.  Cardiovascular: Normal rate and regular rhythm. Exam reveals no gallop and no friction rub.  No murmur heard. Pulmonary/Chest: Effort normal. No respiratory distress. She has no wheezes. She has no rales.  Abdominal: Soft. Bowel sounds are normal. She exhibits no distension and no mass. There is tenderness (diffuse, but predominately lower abdomen). There is no rebound and no guarding.  Negative Rovings sign (she did have RLQ pain with attempted elicited Murphy's sign). +right psoas sign,   Musculoskeletal: Normal range of motion. She exhibits no edema.  Lymphadenopathy:       Right: No inguinal adenopathy present.       Left: No inguinal adenopathy present.  Neurological: She is alert and oriented to person, place, and time. No cranial nerve deficit.  Skin: Skin is warm and dry. No erythema.  Psychiatric: She has a normal mood and affect. Her behavior is normal. Judgment normal.    Female chaperone present for pelvic and breast  portions of the physical exam  Assessment: 37 y.o. G10P2002 female here for  1. Right lower quadrant abdominal pain      Plan: Problem List Items Addressed This Visit      Other   Right lower quadrant abdominal pain     Differential includes; appendicitis, ovarian cyst (or related), ovarian torsion, inguinal hernia, MSK-related, ectopic pregnancy (s/p BTL, endometrial ablation). GU-related.  Given her level of pain and discomfort, concern for appendicitis high enough to recommend  immediate evaluation in the ER.  Recommend patient proceed to the ER instead of attempting work as outpatient.   20 minutes spent in face to face discussion with > 50% spent in counseling,management, and coordination of care of her right lower quadrant abdominal pain.   Prentice Docker, MD 03/16/2018 5:53 PM

## 2018-03-16 NOTE — ED Notes (Signed)
Left leg pain hurts since Thursday and right on Friday.  Saturday went to bed and hasnt been up since.  Went to gyn this am and was sent  Here.  Says pain in very low abd.  Says it did feel like an ovarian cyst at first.  No prob with bowl movements. No urinary symptoms.

## 2018-03-20 ENCOUNTER — Encounter: Payer: Self-pay | Admitting: Obstetrics and Gynecology

## 2018-03-20 DIAGNOSIS — M5416 Radiculopathy, lumbar region: Secondary | ICD-10-CM | POA: Diagnosis not present

## 2018-03-20 DIAGNOSIS — M545 Low back pain: Secondary | ICD-10-CM | POA: Diagnosis not present

## 2018-03-24 ENCOUNTER — Other Ambulatory Visit: Payer: Self-pay | Admitting: Student

## 2018-03-24 DIAGNOSIS — M5416 Radiculopathy, lumbar region: Secondary | ICD-10-CM

## 2018-04-06 ENCOUNTER — Ambulatory Visit: Payer: Medicaid Other | Admitting: Obstetrics and Gynecology

## 2018-04-06 ENCOUNTER — Inpatient Hospital Stay
Admission: RE | Admit: 2018-04-06 | Discharge: 2018-04-06 | Disposition: A | Discharge status: Home / Self Care | Payer: Medicaid Other | Source: Ambulatory Visit | Attending: Student | Admitting: Student

## 2018-04-14 ENCOUNTER — Telehealth: Payer: Self-pay

## 2018-04-14 NOTE — Telephone Encounter (Signed)
Pt phone number on file disconnected. Second time calling. Please let me know if patient calls and gets this scheduled.

## 2018-04-14 NOTE — Telephone Encounter (Signed)
-----   Message from Will Bonnet, MD sent at 04/10/2018  1:34 PM EDT ----- Regarding: FW: needs colposcopy Jensen missed her colposcopy appointment with me. Would you call her to make sure she is planning to reschedule? Thank you!  ----- Message ----- From: Will Bonnet, MD Sent: 03/16/2018   5:54 PM To: Will Bonnet, MD Subject: needs colposcopy

## 2018-04-21 ENCOUNTER — Encounter: Payer: Self-pay | Admitting: Family Medicine

## 2018-04-21 DIAGNOSIS — IMO0002 Reserved for concepts with insufficient information to code with codable children: Secondary | ICD-10-CM

## 2018-04-21 DIAGNOSIS — R229 Localized swelling, mass and lump, unspecified: Principal | ICD-10-CM

## 2018-04-22 NOTE — Telephone Encounter (Signed)
Referral has been generated. Please let patient know.

## 2018-04-22 NOTE — Telephone Encounter (Signed)
Copied from [facility] [id number]. Topic: Referral - Request >> Apr 22, 2018 11:18 AM Vernona Rieger wrote: Reason for CRM: Patient states that she had a referral put in for a ENT by Dr Wynetta Emery for a lump on her right ear ( cyst ). She said by the time they could schedule her surgery her referral expired. She is wanting to know could Dr Wynetta Emery put in another referral for this. It has flared up and is bothering her again. Please advise.   Dr Malon Kindle at [facility] Call back [phone]  >> Apr 22, 2018  4:13 PM Stark Klein wrote: Please advise.

## 2018-06-10 ENCOUNTER — Ambulatory Visit (INDEPENDENT_AMBULATORY_CARE_PROVIDER_SITE_OTHER): Payer: Medicaid Other | Admitting: Family Medicine

## 2018-06-10 ENCOUNTER — Encounter: Payer: Self-pay | Admitting: Family Medicine

## 2018-06-10 VITALS — BP 102/66 | HR 75 | Temp 98.1°F | Ht 69.0 in | Wt 156.7 lb

## 2018-06-10 DIAGNOSIS — B359 Dermatophytosis, unspecified: Secondary | ICD-10-CM

## 2018-06-10 DIAGNOSIS — R5383 Other fatigue: Secondary | ICD-10-CM | POA: Diagnosis not present

## 2018-06-10 MED ORDER — DOXYCYCLINE HYCLATE 100 MG PO TABS: 100.0000 mg | ORAL_TABLET | Freq: Two times a day (BID) | ORAL | 0 refills | Status: DC

## 2018-06-10 MED ORDER — CLOTRIMAZOLE 1 % EX CREA: 1.0000 "application " | TOPICAL_CREAM | Freq: Two times a day (BID) | CUTANEOUS | 0 refills | Status: DC

## 2018-06-10 NOTE — Progress Notes (Signed)
BP 102/66 (BP Location: Right Arm, Patient Position: Sitting, Cuff Size: Normal)   Pulse 75   Temp 98.1 F (36.7 C) (Oral)   Ht _0  (1.753 m)   Wt 156 lb 11.2 oz (71.1 kg)   SpO2 96%   BMI 23.14 kg/m    Subjective:    Patient ID: Jessica Peters, female    DOB: Dec 04, 1980, 37 y.o.   MRN: [id number]  HPI: Jessica Peters is a 37 y.o. female  Chief Complaint  Patient presents with  . Fatigue    Patient states she just "doesn't feel good." No energey.   . Rash    Left knee   Malaise, fatigue, low back aching (which isn't out of the ordinary), brain fog, myalgias, headache. No fevers, sweats, CP, SOB, abdominal pain. Went camping 3 weeks ago and had pulled some ticks off. Has been taking tylenol and NSAIDs prn with mild relief.  Also has an itchy rash on left thigh. Has been putting cortisone cream on the area with no relief. No new soaps, lotions, foods, medications. Has been out in the woods recently.  Past Medical History:  Diagnosis Date  . Allergy   . Anemia   . Anxiety   . BRCA negative 02/2018   MyRisk neg  . Endometriosis   . Family history of breast cancer 02/2018   My Risk neg  . Headache    h/o migraines  . Increased risk of breast cancer 02/2018   IBIS=18.8%/riskscore=26.3%  . Ovarian cyst   . Ovarian cyst   . PTSD (post-traumatic stress disorder)   . PTSD (post-traumatic stress disorder)   . Thrombocytopenia ([place])   . Vitamin D deficiency    Social History   Socioeconomic History  . Marital status: Single    Spouse name: Not on file  . Number of children: Not on file  . Years of education: Not on file  . Highest education level: Not on file  Occupational History  . Not on file  Social Needs  . Financial resource strain: Not on file  . Food insecurity:    Worry: Not on file    Inability: Not on file  . Transportation needs:    Medical: Not on file    Non-medical: Not on file  Tobacco Use  . Smoking status: Current Every Day Smoker   Packs/day: 0.50    Years: 13.00    Pack years: 6.50    Types: Cigarettes  . Smokeless tobacco: Never Used  Substance and Sexual Activity  . Alcohol use: Yes    Alcohol/week: 3.6 oz    Types: 3 Glasses of wine, 3 Cans of beer per week    Comment: socially  . Drug use: No  . Sexual activity: Yes    Birth control/protection: Surgical  Lifestyle  . Physical activity:    Days per week: Not on file    Minutes per session: Not on file  . Stress: Not on file  Relationships  . Social connections:    Talks on phone: Not on file    Gets together: Not on file    Attends religious service: Not on file    Active member of club or organization: Not on file    Attends meetings of clubs or organizations: Not on file    Relationship status: Not on file  . Intimate partner violence:    Fear of current or ex partner: Not on file    Emotionally abused: Not on file  Physically abused: Not on file    Forced sexual activity: Not on file  Other Topics Concern  . Not on file  Social History Narrative  . Not on file   Relevant past medical, surgical, family and social history reviewed and updated as indicated. Interim medical history since our last visit reviewed. Allergies and medications reviewed and updated.  Review of Systems  Per HPI unless specifically indicated above     Objective:    BP 102/66 (BP Location: Right Arm, Patient Position: Sitting, Cuff Size: Normal)   Pulse 75   Temp 98.1 F (36.7 C) (Oral)   Ht _0  (1.753 m)   Wt 156 lb 11.2 oz (71.1 kg)   SpO2 96%   BMI 23.14 kg/m   Wt Readings from Last 3 Encounters:  06/10/18 156 lb 11.2 oz (71.1 kg)  03/16/18 156 lb (70.8 kg)  03/16/18 156 lb (70.8 kg)    Physical Exam  Constitutional: She is oriented to person, place, and time. She appears well-developed and well-nourished.  Lethargic  HENT:  Head: Atraumatic.  Eyes: Pupils are equal, round, and reactive to light. Conjunctivae are normal.  Neck: Normal range of  motion. Neck supple.  Cardiovascular: Normal rate and regular rhythm.  Pulmonary/Chest: Effort normal and breath sounds normal.  Abdominal: Soft. Bowel sounds are normal.  Musculoskeletal: Normal range of motion. She exhibits tenderness (diffuse ttp, superficial). She exhibits no edema.  Lymphadenopathy:    She has no cervical adenopathy.  Neurological: She is alert and oriented to person, place, and time.  Skin: Skin is warm and dry.  Psychiatric:  Lethargic, which is not baseline for pt  Nursing note and vitals reviewed.   Results for orders placed or performed in visit on 06/10/18  TSH  Result Value Ref Range   TSH 0.896 0.450 - 4.500 uIU/mL  CBC with Differential/Platelet  Result Value Ref Range   WBC 5.7 3.4 - 10.8 x10E3/uL   RBC 4.65 3.77 - 5.28 x10E6/uL   Hemoglobin 13.9 11.1 - 15.9 g/dL   Hematocrit 43.4 34.0 - 46.6 %   MCV 93 79 - 97 fL   MCH 29.9 26.6 - 33.0 pg   MCHC 32.0 31.5 - 35.7 g/dL   RDW 13.0 12.3 - 15.4 %   Platelets 140 (L) 150 - 450 x10E3/uL   Neutrophils 57 Not Estab. %   Lymphs 33 Not Estab. %   Monocytes 6 Not Estab. %   Eos 4 Not Estab. %   Basos 0 Not Estab. %   Neutrophils Absolute 3.2 1.4 - 7.0 x10E3/uL   Lymphocytes Absolute 1.9 0.7 - 3.1 x10E3/uL   Monocytes Absolute 0.3 0.1 - 0.9 x10E3/uL   EOS (ABSOLUTE) 0.2 0.0 - 0.4 x10E3/uL   Basophils Absolute 0.0 0.0 - 0.2 x10E3/uL   Immature Granulocytes 0 Not Estab. %   Immature Grans (Abs) 0.0 0.0 - 0.1 x10E3/uL  Rocky mtn spotted fvr abs pnl(IgG+IgM)  Result Value Ref Range   RMSF IgG Positive (A) Negative   RMSF IgM 0.45 0.00 - 0.89 index  Lyme Ab/Western Blot Reflex  Result Value Ref Range   Lyme IgG/IgM Ab <0.91 0.00 - 0.90 ISR   LYME DISEASE AB, QUANT, IGM <0.80 0.00 - 0.79 index  RMSF, IgG, IFA  Result Value Ref Range   RMSF, IGG, IFA 1:64 (H) Neg <1:64      Assessment & Plan:   Problem List Items Addressed This Visit    None    Visit Diagnoses  Fatigue, unspecified type    -   Primary   Basic labs drawn to r/o organic causes, tick titers drawn given exposure. Will start doxy while waiting for results. Return precautions reviewed   Relevant Orders   TSH (Completed)   CBC with Differential/Platelet (Completed)   Rocky mtn spotted fvr abs pnl(IgG+IgM) (Completed)   Lyme Ab/Western Blot Reflex (Completed)   Ringworm       Clotrimazole cream sent. Moisturize, avoid cortisone cream.    Relevant Medications   clotrimazole (LOTRIMIN) 1 % cream       Follow up plan: Return if symptoms worsen or fail to improve.

## 2018-06-12 ENCOUNTER — Telehealth: Payer: Self-pay | Admitting: Family Medicine

## 2018-06-12 LAB — CBC WITH DIFFERENTIAL/PLATELET
Basophils Absolute: 0 10*3/uL (ref 0.0–0.2)
Basos: 0 %
EOS (ABSOLUTE): 0.2 10*3/uL (ref 0.0–0.4)
EOS: 4 %
HEMATOCRIT: 43.4 % (ref 34.0–46.6)
HEMOGLOBIN: 13.9 g/dL (ref 11.1–15.9)
Immature Grans (Abs): 0 10*3/uL (ref 0.0–0.1)
Immature Granulocytes: 0 %
LYMPHS ABS: 1.9 10*3/uL (ref 0.7–3.1)
Lymphs: 33 %
MCH: 29.9 pg (ref 26.6–33.0)
MCHC: 32 g/dL (ref 31.5–35.7)
MCV: 93 fL (ref 79–97)
MONOCYTES: 6 %
Monocytes Absolute: 0.3 10*3/uL (ref 0.1–0.9)
NEUTROS ABS: 3.2 10*3/uL (ref 1.4–7.0)
Neutrophils: 57 %
Platelets: 140 10*3/uL — ABNORMAL LOW (ref 150–450)
RBC: 4.65 x10E6/uL (ref 3.77–5.28)
RDW: 13 % (ref 12.3–15.4)
WBC: 5.7 10*3/uL (ref 3.4–10.8)

## 2018-06-12 LAB — RMSF, IGG, IFA: RMSF, IGG, IFA: 1:64 {titer} — ABNORMAL HIGH

## 2018-06-12 LAB — ROCKY MTN SPOTTED FVR ABS PNL(IGG+IGM)
RMSF IgG: POSITIVE — AB
RMSF IgM: 0.45 index (ref 0.00–0.89)

## 2018-06-12 LAB — TSH: TSH: 0.896 u[IU]/mL (ref 0.450–4.500)

## 2018-06-12 LAB — LYME AB/WESTERN BLOT REFLEX: LYME DISEASE AB, QUANT, IGM: <0.8 index (ref 0.00–0.79)

## 2018-06-12 NOTE — Telephone Encounter (Signed)
Called pt to discuss lab results - positive RMSF. Already on doxy. Will try her again Monday

## 2018-06-13 NOTE — Patient Instructions (Signed)
Follow up as needed

## 2018-06-14 IMAGING — US US PELVIS COMPLETE
1 series · 13 of 25 positions shown · non-contrast
Comparison: [DATE]

CLINICAL DATA: Right-sided pelvic pain for 3 days. Previous history
of endometrial ablation.

EXAM:
TRANSABDOMINAL AND TRANSVAGINAL ULTRASOUND OF PELVIS
DOPPLER ULTRASOUND OF OVARIES
TECHNIQUE: Both transabdominal and transvaginal ultrasound examinations of the
pelvis were performed. Transabdominal technique was performed for
global imaging of the pelvis including uterus, ovaries, adnexal
regions, and pelvic cul-de-sac.
It was necessary to proceed with endovaginal exam following the
transabdominal exam to visualize the endometrium and ovaries. Color
and duplex Doppler ultrasound was utilized to evaluate blood flow to
the ovaries.

[Series 1: us pelvis complete · 0.25mm/px · 13 of 101 slices shown]
[im 1/101]
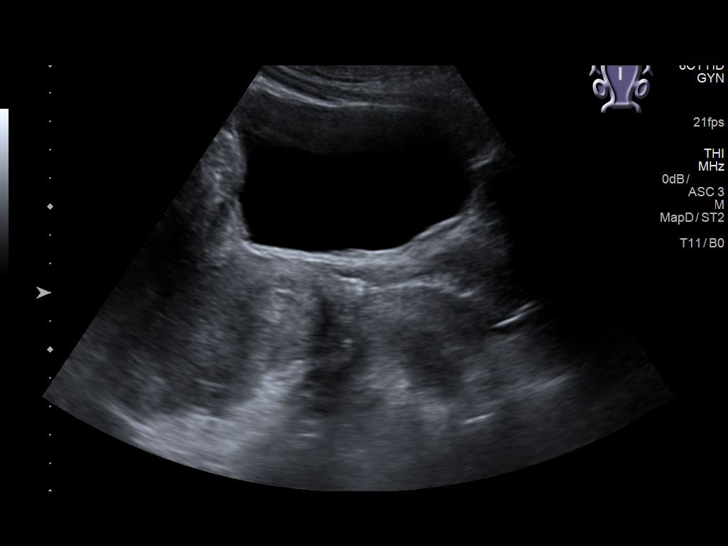
[im 9/101]
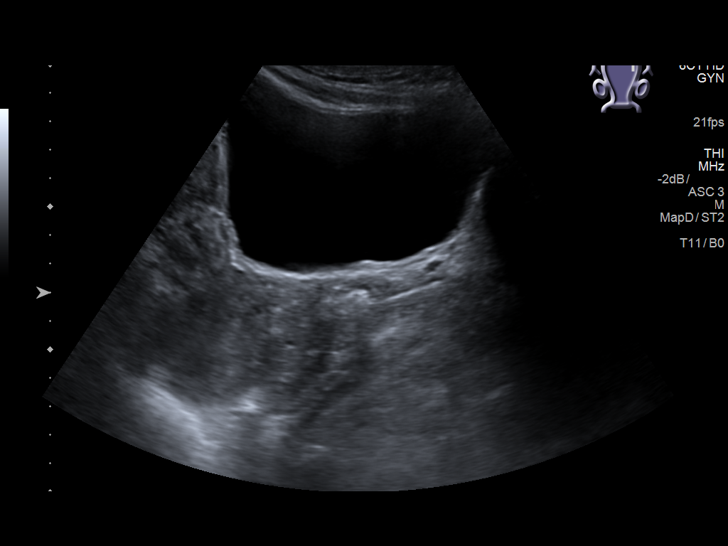
[im 17/101]
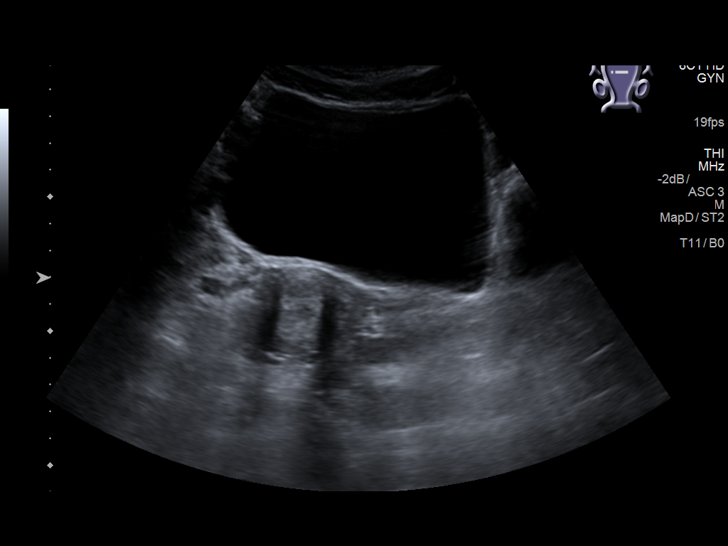
[im 26/101]
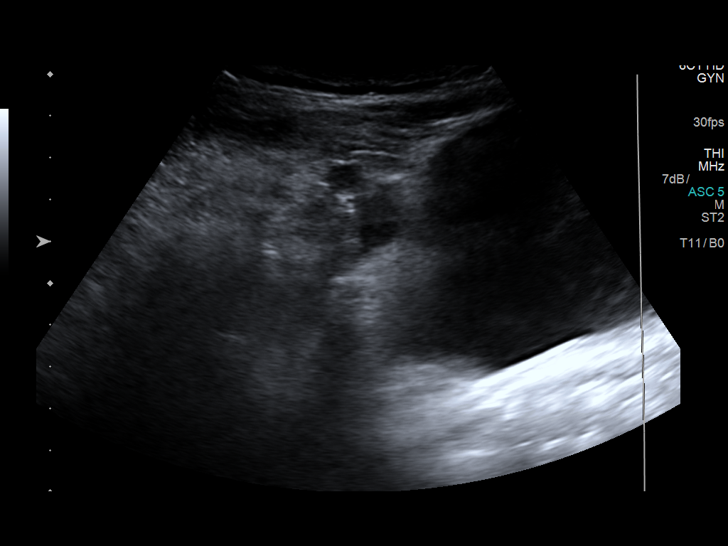
[im 34/101]
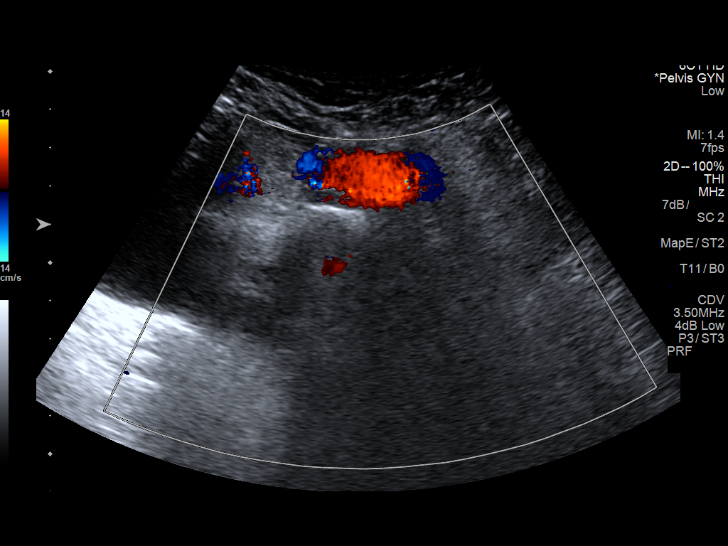
[im 42/101]
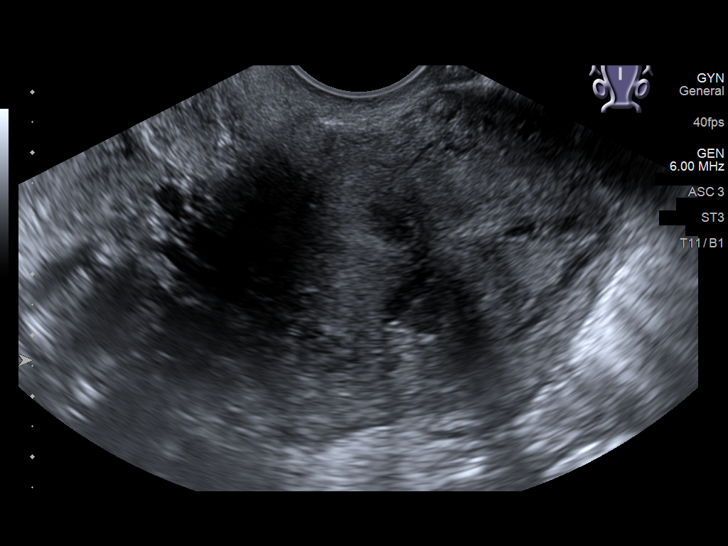
[im 51/101]
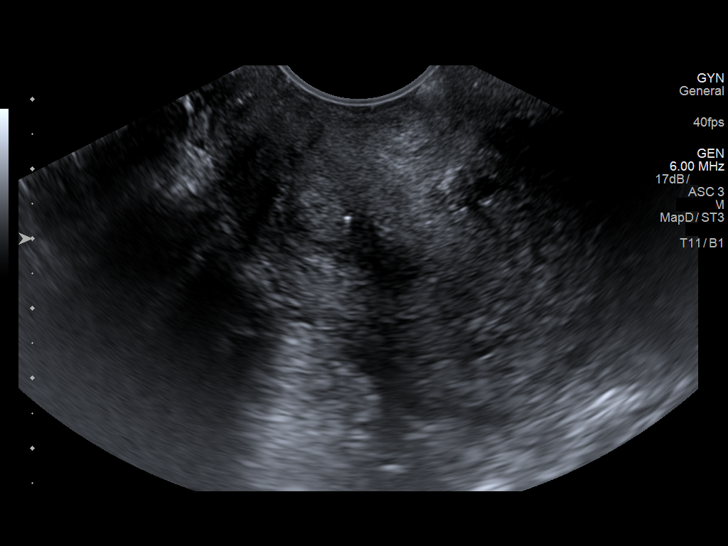
[im 59/101]
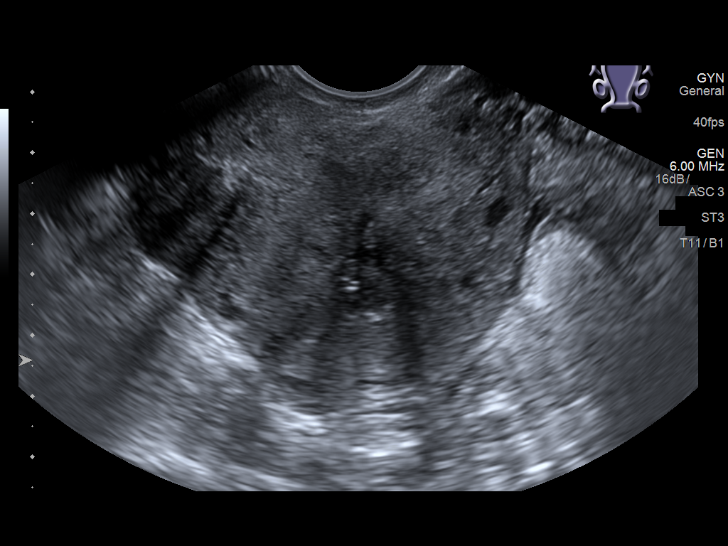
[im 67/101]
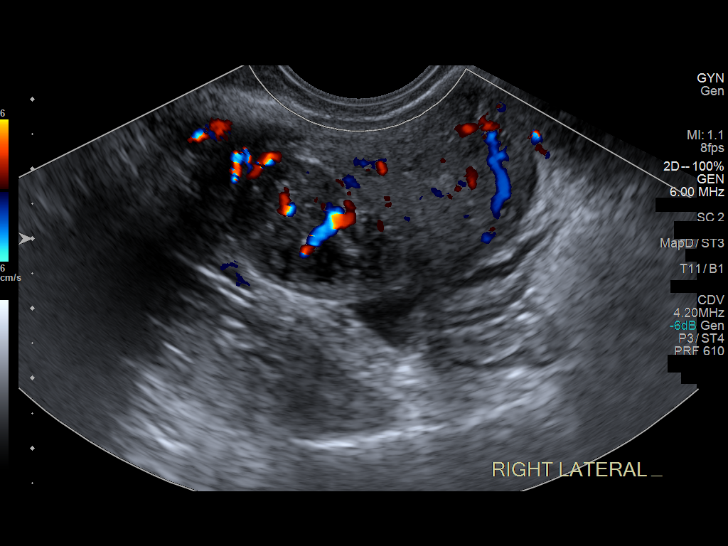
[im 76/101]
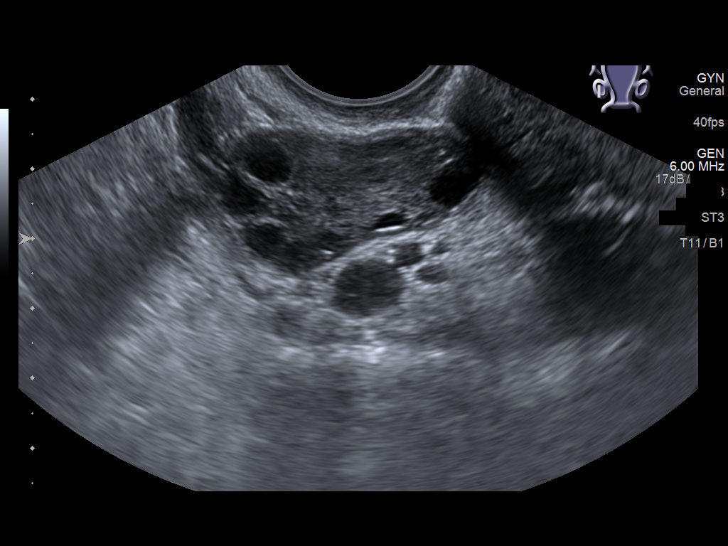
[im 84/101]
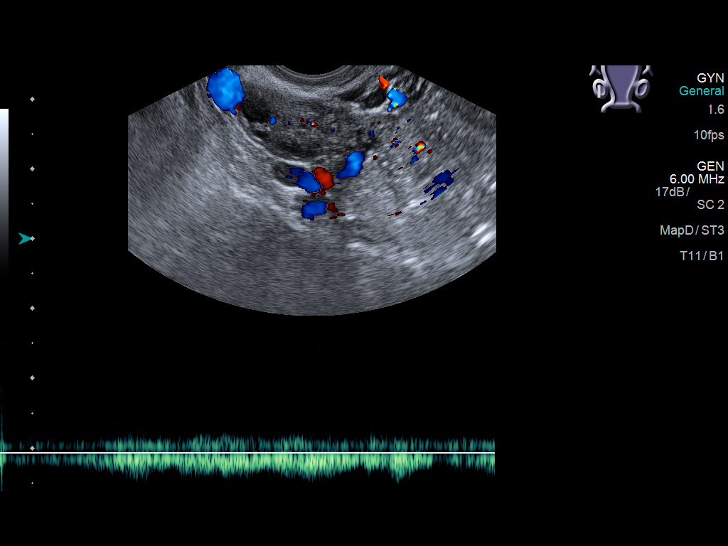
[im 92/101]
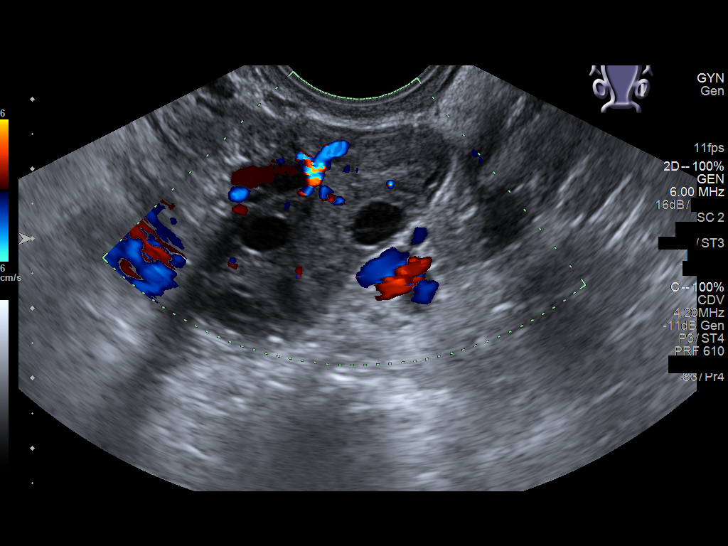
[im 101/101]
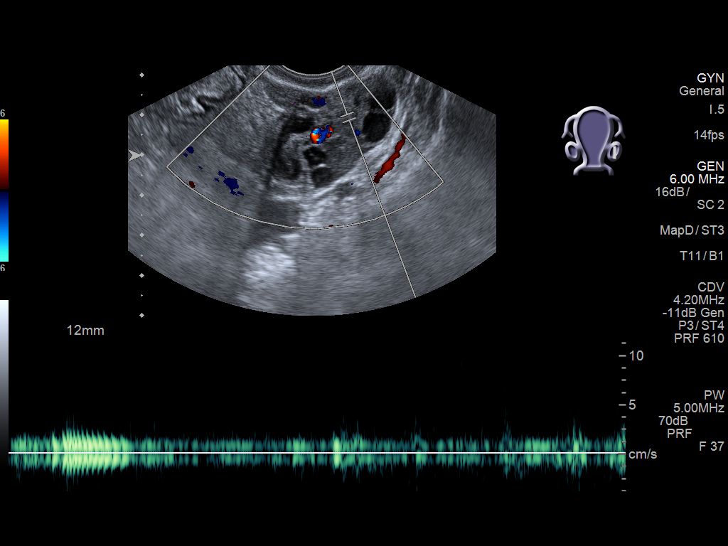

[13 of 25 positions shown; findings below may reference images not displayed]

FINDINGS: Uterus

Measurements: 7.9 x 5.8 x 6.0 cm. Retroverted. Diffusely
heterogeneous myometrial echotexture, likely due to prior
endometrial ablation. An intramural fibroid is seen in the anterior
corpus measuring 1.3 cm. A subserosal fibroid is seen in the right
anterior corpus measuring 2.2 cm.

Endometrium

Thickness: Poorly visualized cough likely due to prior endometrial
ablation. No evidence of hydrometros.

Right ovary

Measurements: 3.4 x 1.6 x 3.3 cm. Normal appearance/no adnexal mass.

Left ovary

Measurements: 3.7 x 2.6 x 2.4 cm. Normal appearance/no adnexal mass.

Pulsed Doppler evaluation of both ovaries demonstrates normal
low-resistance arterial and venous waveforms.

Other findings

No abnormal free fluid.
IMPRESSION: Retroverted uterus with 2 small fibroids, largest measuring 2.2 cm.

Findings consistent with prior endometrial ablation. No evidence of
hydrometros.

Normal appearance of both ovaries.  No adnexal mass identified.

No sonographic evidence for ovarian torsion.

## 2018-06-15 NOTE — Telephone Encounter (Signed)
Called pt, discussed completing the full course of doxy to treat RMSF. Feeling some better. Pt to call if worsening or not going away

## 2018-06-16 ENCOUNTER — Telehealth: Payer: Self-pay | Admitting: Family Medicine

## 2018-06-16 NOTE — Telephone Encounter (Unsigned)
Copied from [place] [phone]. Topic: General - Other >> Jun 16, 2018  4:12 PM Keene Breath wrote: Reason for CRM: Patient called to request something for nausea.  She states that doctor said to call if she needed something.  CB# [phone].

## 2018-06-17 NOTE — Telephone Encounter (Signed)
See message. Please advise.  Thank you

## 2018-06-18 MED ORDER — ONDANSETRON 4 MG PO TBDP: 4.0000 mg | ORAL_TABLET | Freq: Three times a day (TID) | ORAL | 0 refills | Status: DC | PRN

## 2018-06-18 NOTE — Telephone Encounter (Signed)
Rx sent 

## 2018-09-08 ENCOUNTER — Other Ambulatory Visit: Payer: Self-pay

## 2018-09-08 ENCOUNTER — Emergency Department: Payer: Medicaid Other

## 2018-09-08 ENCOUNTER — Emergency Department
Admission: EM | Admit: 2018-09-08 | Discharge: 2018-09-08 | Disposition: A | Discharge status: Home / Self Care | Payer: Medicaid Other | Attending: Emergency Medicine | Admitting: Emergency Medicine

## 2018-09-08 DIAGNOSIS — N938 Other specified abnormal uterine and vaginal bleeding: Secondary | ICD-10-CM | POA: Diagnosis not present

## 2018-09-08 DIAGNOSIS — R1031 Right lower quadrant pain: Secondary | ICD-10-CM | POA: Diagnosis present

## 2018-09-08 DIAGNOSIS — B9689 Other specified bacterial agents as the cause of diseases classified elsewhere: Secondary | ICD-10-CM | POA: Diagnosis not present

## 2018-09-08 DIAGNOSIS — R102 Pelvic and perineal pain: Secondary | ICD-10-CM | POA: Diagnosis not present

## 2018-09-08 DIAGNOSIS — N760 Acute vaginitis: Secondary | ICD-10-CM

## 2018-09-08 DIAGNOSIS — F17210 Nicotine dependence, cigarettes, uncomplicated: Secondary | ICD-10-CM | POA: Insufficient documentation

## 2018-09-08 DIAGNOSIS — R1030 Lower abdominal pain, unspecified: Secondary | ICD-10-CM

## 2018-09-08 DIAGNOSIS — D259 Leiomyoma of uterus, unspecified: Secondary | ICD-10-CM | POA: Diagnosis not present

## 2018-09-08 LAB — COMPREHENSIVE METABOLIC PANEL
ALT: 13 U/L (ref 0–44)
AST: 17 U/L (ref 15–41)
Albumin: 4.3 g/dL (ref 3.5–5.0)
Alkaline Phosphatase: 30 U/L — ABNORMAL LOW (ref 38–126)
Anion gap: 9 (ref 5–15)
BILIRUBIN TOTAL: 0.7 mg/dL (ref 0.3–1.2)
BUN: 15 mg/dL (ref 6–20)
CO2: 25 mmol/L (ref 22–32)
CREATININE: 0.72 mg/dL (ref 0.44–1.00)
Calcium: 8.8 mg/dL — ABNORMAL LOW (ref 8.9–10.3)
Chloride: 106 mmol/L (ref 98–111)
GFR calc Af Amer: >60 mL/min (ref >60)
Glucose, Bld: 99 mg/dL (ref 70–99)
POTASSIUM: 3.8 mmol/L (ref 3.5–5.1)
Sodium: 140 mmol/L (ref 135–145)
TOTAL PROTEIN: 7.3 g/dL (ref 6.5–8.1)

## 2018-09-08 LAB — WET PREP, GENITAL
SPERM: NONE SEEN
TRICH WET PREP: NONE SEEN
Yeast Wet Prep HPF POC: NONE SEEN

## 2018-09-08 LAB — CBC
HCT: 45.1 % (ref 36.0–46.0)
HEMOGLOBIN: 14.6 g/dL (ref 12.0–15.0)
MCH: 30.4 pg (ref 26.0–34.0)
MCHC: 32.4 g/dL (ref 30.0–36.0)
MCV: 94 fL (ref 80.0–100.0)
PLATELETS: 165 10*3/uL (ref 150–400)
RBC: 4.8 MIL/uL (ref 3.87–5.11)
RDW: 11.9 % (ref 11.5–15.5)
WBC: 6.3 10*3/uL (ref 4.0–10.5)
nRBC: 0 % (ref 0.0–0.2)

## 2018-09-08 LAB — URINALYSIS, COMPLETE (UACMP) WITH MICROSCOPIC
Bilirubin Urine: NEGATIVE
GLUCOSE, UA: NEGATIVE mg/dL
HGB URINE DIPSTICK: NEGATIVE
Ketones, ur: NEGATIVE mg/dL
Leukocytes, UA: NEGATIVE
NITRITE: NEGATIVE
PH: 6 (ref 5.0–8.0)
Protein, ur: NEGATIVE mg/dL
Specific Gravity, Urine: 1.016 (ref 1.005–1.030)

## 2018-09-08 LAB — CHLAMYDIA/NGC RT PCR (ARMC ONLY)
Chlamydia Tr: NOT DETECTED
N GONORRHOEAE: NOT DETECTED

## 2018-09-08 LAB — POCT PREGNANCY, URINE: Preg Test, Ur: NEGATIVE

## 2018-09-08 LAB — LIPASE, BLOOD: Lipase: 25 U/L (ref 11–51)

## 2018-09-08 IMAGING — CT CT ABD-PELV W/ CM
2 of 4 series · 16 of 46 positions shown, 18 images · IV contrast (APPLIED)
Comparison: Pelvic ultrasound from same day. CT abdomen pelvis
dated [DATE].

CLINICAL DATA: Right-sided pelvic pain.

EXAM:
CT ABDOMEN AND PELVIS WITH CONTRAST
TECHNIQUE: Multidetector CT imaging of the abdomen and pelvis was performed
using the standard protocol following bolus administration of
intravenous contrast.
CONTRAST:  100mL [45] IOPAMIDOL ([45]) INJECTION 61%

[Series 2: routine abd/pel with · axial · 0.73mm/px · z∈[-534,-84]mm · 13 of 98 slices shown, 15 images]
[im 4/98  soft-tissue]
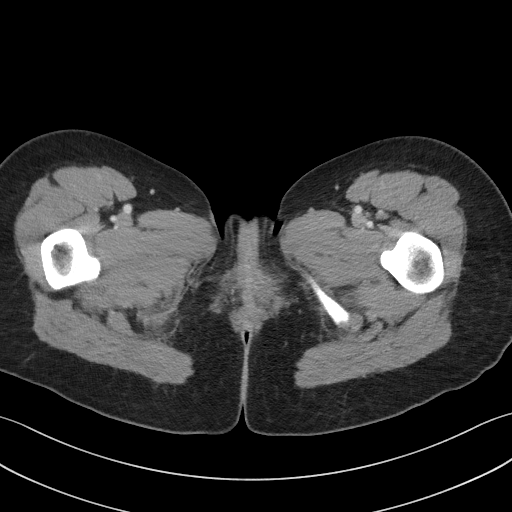
[im 4/98  bone]
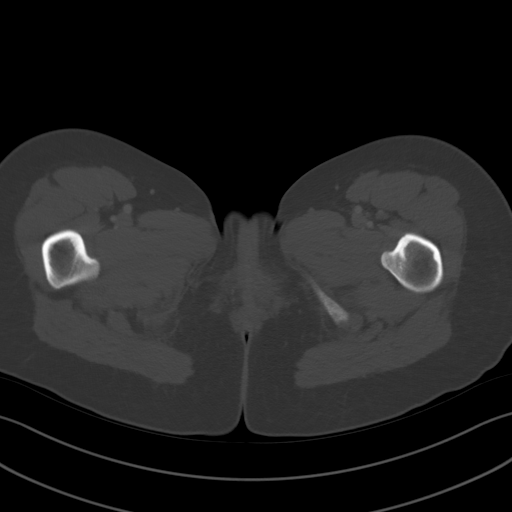
[im 12/98  soft-tissue]
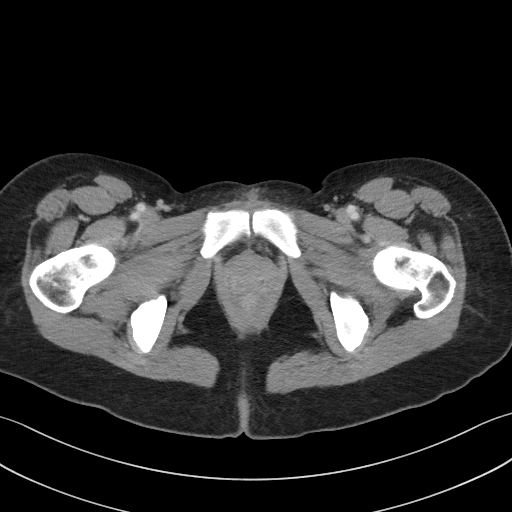
[im 19/98  soft-tissue]
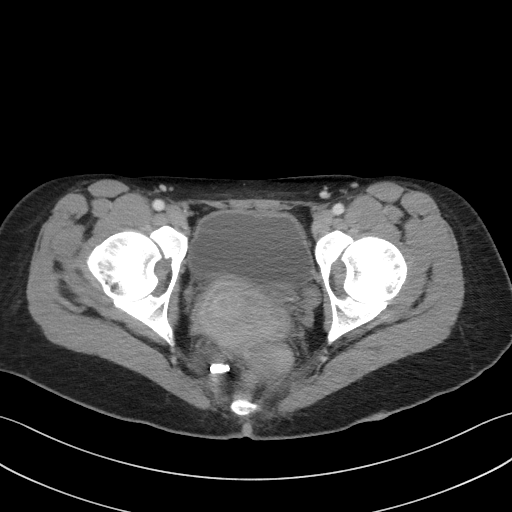
[im 27/98  soft-tissue]
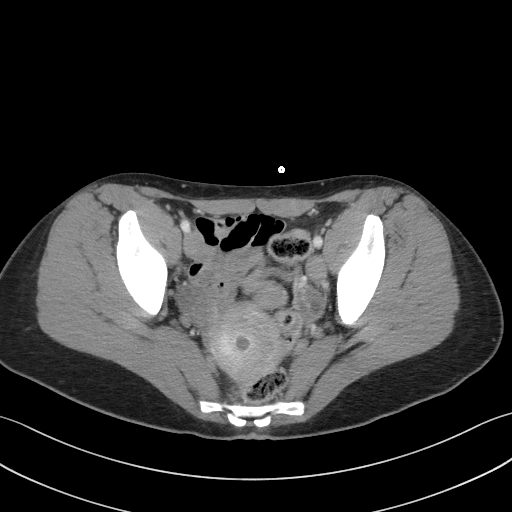
[im 34/98  soft-tissue]
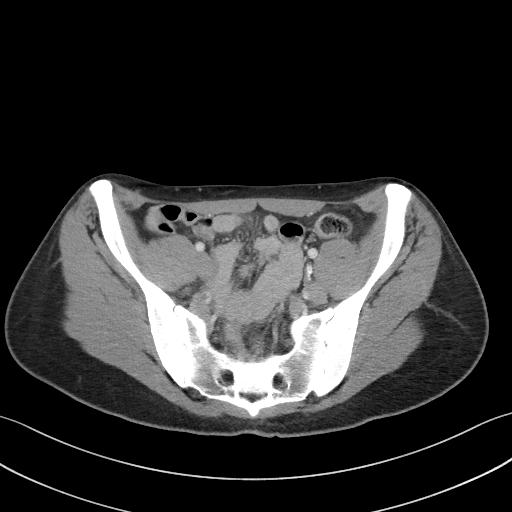
[im 42/98  soft-tissue]
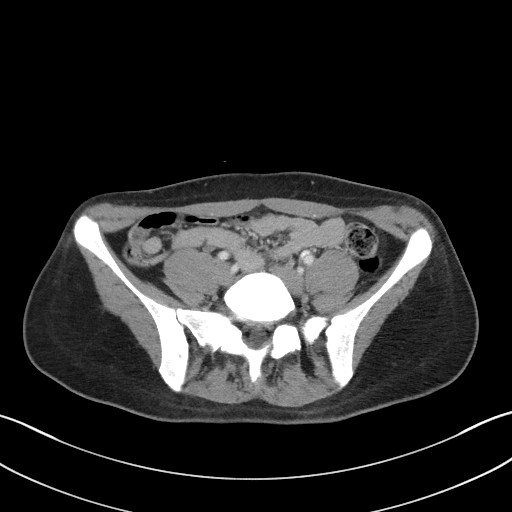
[im 49/98  soft-tissue]
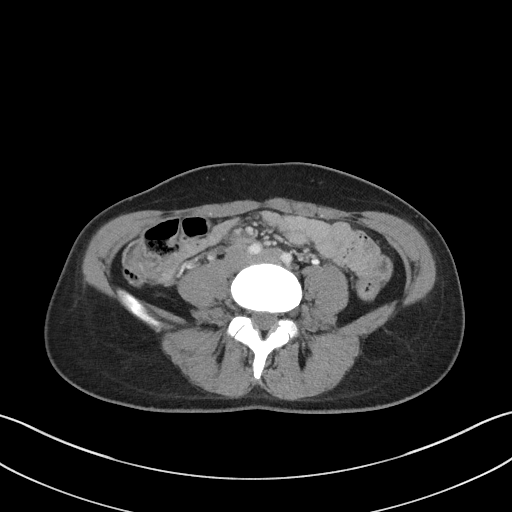
[im 56/98  soft-tissue]
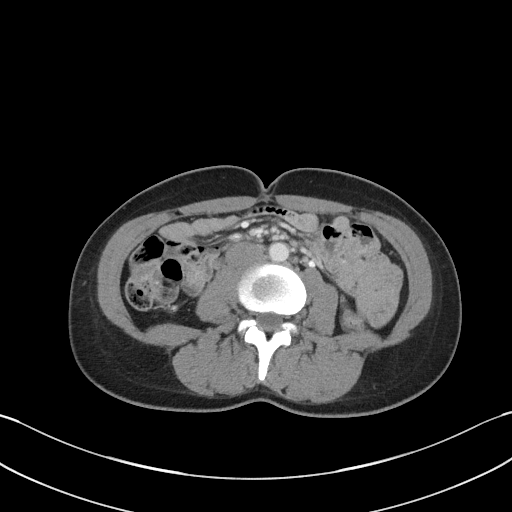
[im 64/98  soft-tissue]
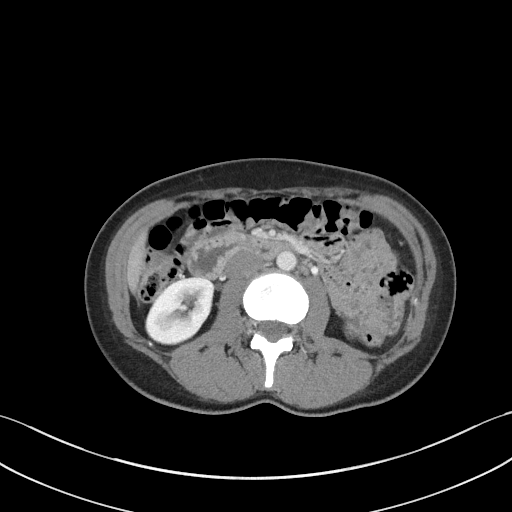
[im 64/98  bone]
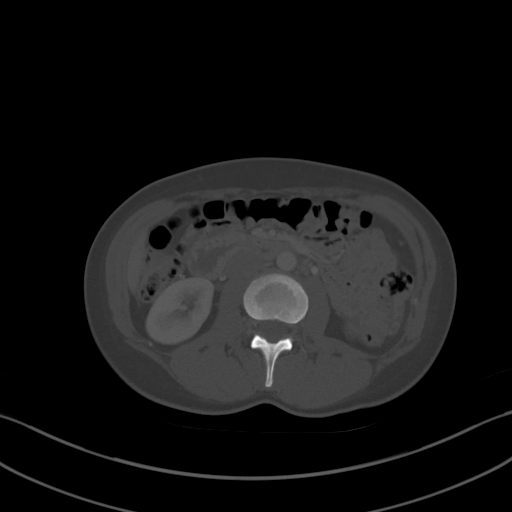
[im 71/98  soft-tissue]
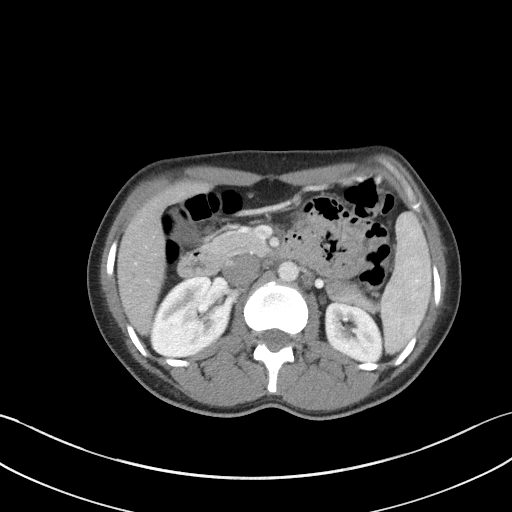
[im 79/98  soft-tissue]
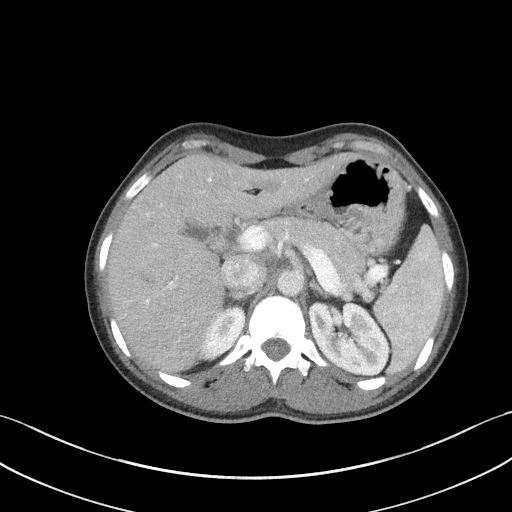
[im 86/98  soft-tissue]
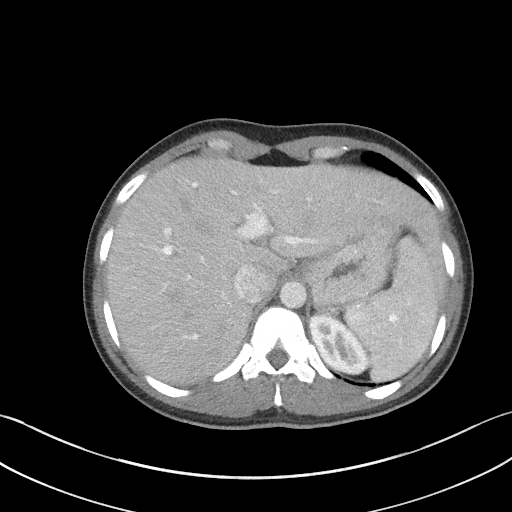
[im 94/98  soft-tissue]
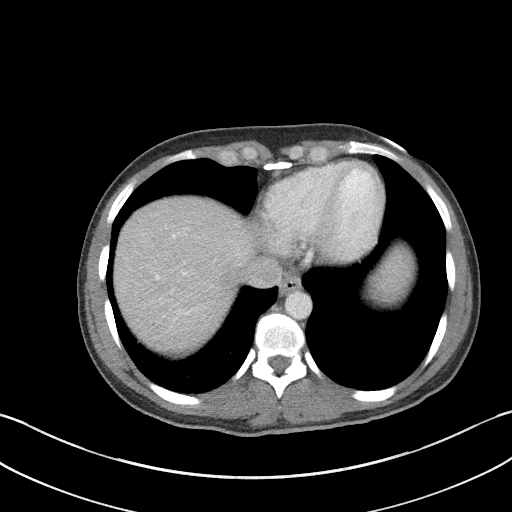

[Series 5: coronal st · coronal · 0.82mm/px · 3 of 66 slices shown]
[im 22/66  soft-tissue]
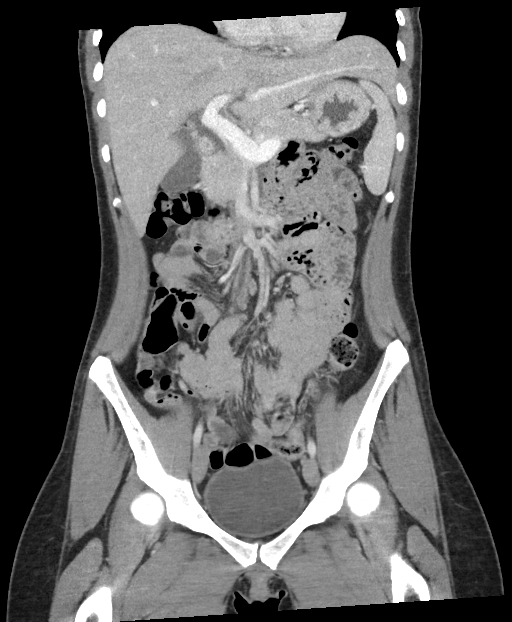
[im 29/66  soft-tissue]
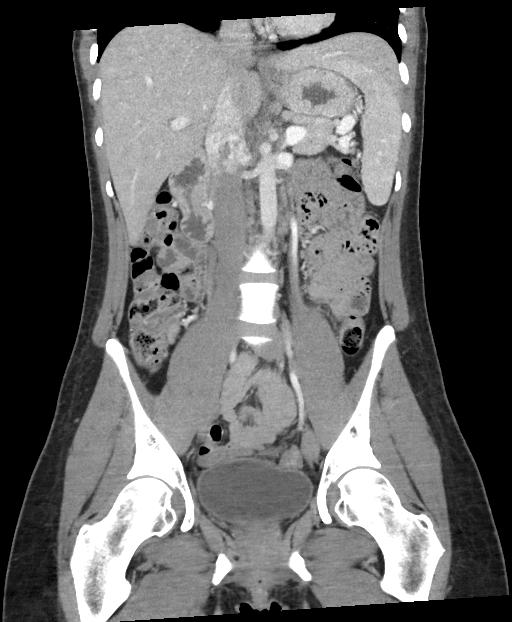
[im 37/66  soft-tissue]
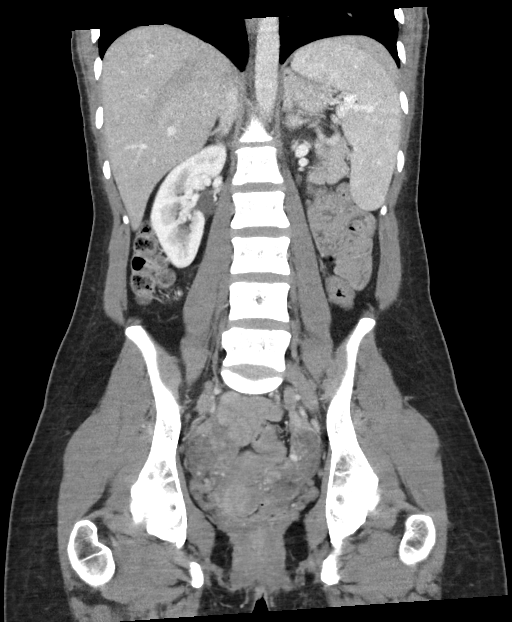

[16 of 46 positions shown; findings below may reference images not displayed]

FINDINGS: Lower chest: No acute abnormality.

Hepatobiliary: No focal liver abnormality is seen. No gallstones,
gallbladder wall thickening, or biliary dilatation.

Pancreas: Unremarkable. No pancreatic ductal dilatation or
surrounding inflammatory changes.

Spleen: Normal in size without focal abnormality.

Adrenals/Urinary Tract: Adrenal glands are unremarkable. Kidneys are
normal, without renal calculi, focal lesion, or hydronephrosis.
Bladder is unremarkable.

Stomach/Bowel: Stomach is within normal limits. Appendix appears
normal. No evidence of bowel wall thickening, distention, or
inflammatory changes.

Vascular/Lymphatic: No significant vascular findings are present. No
enlarged abdominal or pelvic lymph nodes.

Reproductive: Retroverted uterus. 2.3 cm subserosal fibroid in the
right uterine body. No adnexal mass.

Other: Trace free fluid in the pelvis, likely physiologic. No
pneumoperitoneum.

Musculoskeletal: No acute or significant osseous findings.
IMPRESSION: 1.  No acute intra-abdominal process.  Normal appendix.
2. Unchanged fibroid uterus.

## 2018-09-08 MED ORDER — ONDANSETRON 4 MG PO TBDP
4.0000 mg | ORAL_TABLET | Freq: Once | ORAL | Status: AC | PRN
  Administered 2018-09-08: 4 mg via ORAL
  Filled 2018-09-08: qty 1

## 2018-09-08 MED ORDER — DICLOFENAC SODIUM 1 % TD GEL: TRANSDERMAL | 0 refills | Status: DC

## 2018-09-08 MED ORDER — METRONIDAZOLE 500 MG PO TABS: 500.0000 mg | ORAL_TABLET | Freq: Two times a day (BID) | ORAL | 0 refills | Status: AC

## 2018-09-08 MED ORDER — MORPHINE SULFATE (PF) 4 MG/ML IV SOLN
4.0000 mg | Freq: Once | INTRAVENOUS | Status: AC
  Administered 2018-09-08: 4 mg via INTRAVENOUS
  Filled 2018-09-08: qty 1

## 2018-09-08 MED ORDER — ONDANSETRON HCL 4 MG/2ML IJ SOLN
4.0000 mg | Freq: Once | INTRAMUSCULAR | Status: AC
  Administered 2018-09-08: 4 mg via INTRAVENOUS
  Filled 2018-09-08: qty 2

## 2018-09-08 MED ORDER — OXYCODONE-ACETAMINOPHEN 5-325 MG PO TABS
1.0000 | ORAL_TABLET | ORAL | Status: DC | PRN
  Administered 2018-09-08: 1 via ORAL
  Filled 2018-09-08: qty 1

## 2018-09-08 MED ORDER — IOPAMIDOL (ISOVUE-300) INJECTION 61%
100.0000 mL | Freq: Once | INTRAVENOUS | Status: AC | PRN
  Administered 2018-09-08: 100 mL via INTRAVENOUS
  Filled 2018-09-08: qty 100

## 2018-09-08 MED ORDER — SODIUM CHLORIDE 0.9 % IV BOLUS
1000.0000 mL | Freq: Once | INTRAVENOUS | Status: AC
  Administered 2018-09-08: 1000 mL via INTRAVENOUS

## 2018-09-08 MED ORDER — METRONIDAZOLE 500 MG PO TABS
500.0000 mg | ORAL_TABLET | Freq: Once | ORAL | Status: AC
  Administered 2018-09-08: 500 mg via ORAL
  Filled 2018-09-08: qty 1

## 2018-09-08 NOTE — ED Provider Notes (Signed)
[facility] Emergency Department Provider Note  ___________________________________________   First MD Initiated Contact with Patient 09/08/18 1420     (approximate)  I have reviewed the triage vital signs and the nursing notes.   HISTORY  Chief Complaint Pelvic Pain   HPI Jessica Peters is a 37 y.o. female with a history of endometriosis as well as ovarian cyst status post endometrial ablation in 2017 was presenting with right lower quadrant abdominal pain over the past 3 days.  She says that the pain is sharp and constant and is now 5 out of 10 after taking a Percocet prior to the evaluation.  Patient says that she also has had nausea but no vomiting.  No new sexual partners and a low suspicion for STDs.  Says that she is small amount of bloody discharge this past Sunday but that is since stopped.  Patient said that she has not had a period over the past almost 3 years since having her ablation.  Patient also says that she has had a history of ovarian cysts, bilaterally.   Past Medical History:  Diagnosis Date  . Allergy   . Anemia   . Anxiety   . BRCA negative 02/2018   MyRisk neg  . Endometriosis   . Family history of breast cancer 02/2018   My Risk neg  . Headache    h/o migraines  . Increased risk of breast cancer 02/2018   IBIS=18.8%/riskscore=26.3%  . Ovarian cyst   . Ovarian cyst   . PTSD (post-traumatic stress disorder)   . PTSD (post-traumatic stress disorder)   . Thrombocytopenia ([place])   . Vitamin D deficiency     Patient Active Problem List   Diagnosis Date Noted  . Right lower quadrant abdominal pain 03/16/2018  . Allergic rhinitis 10/03/2017  . Major depression, recurrent, chronic ([place]) 10/03/2017  . Anxiety 06/13/2017  . Vitamin D deficiency   . Family history of breast cancer   . Breast lump on right side at 1 o'clock position 02/03/2017    Past Surgical History:  Procedure Laterality Date  . DILITATION &  CURRETTAGE/HYSTROSCOPY WITH NOVASURE ABLATION  12/07/2015   Procedure: DILATATION & CURETTAGE/HYSTEROSCOPY WITH NOVASURE ABLATION;  Surgeon: Will Bonnet, MD;  Location: [facility] ORS;  Service: Gynecology;;  . MOUTH SURGERY    . TONSILLECTOMY    . TUBAL LIGATION      Prior to Admission medications   Medication Sig Start Date End Date Taking? Authorizing Provider  BIOTIN PO Take 1 Dose by mouth daily.    [provider]  clotrimazole (LOTRIMIN) 1 % cream Apply 1 application topically 2 (two) times daily. 06/10/18   Volney American, PA-C  doxycycline (VIBRA-TABS) 100 MG tablet Take 1 tablet (100 mg total) by mouth 2 (two) times daily. 06/10/18   Volney American, PA-C  ondansetron (ZOFRAN ODT) 4 MG disintegrating tablet Take 1 tablet (4 mg total) by mouth every 8 (eight) hours as needed. 06/18/18   Volney American, PA-C    Allergies Patient has no known allergies.  Family History  Problem Relation Age of Onset  . Breast cancer Paternal Aunt 24  . Cancer Paternal Aunt        Breast  . Breast cancer Paternal Grandmother 66  . Stroke Paternal Grandmother   . Clotting disorder Paternal Grandmother   . Cancer Paternal Grandmother        Breast  . Depression Mother   . Depression Sister   .  Alcohol abuse Maternal Grandmother   . Stroke Maternal Grandmother   . Depression Maternal Grandmother   . Cancer Maternal Grandmother        Liver  . Alcohol abuse Maternal Grandfather   . Stroke Maternal Grandfather   . Cancer Maternal Grandfather        Liver  . Stroke Paternal Grandfather   . Heart disease Paternal Grandfather     Social History Social History   Tobacco Use  . Smoking status: Current Every Day Smoker    Packs/day: 0.50    Years: 13.00    Pack years: 6.50    Types: Cigarettes  . Smokeless tobacco: Never Used  Substance Use Topics  . Alcohol use: Yes    Alcohol/week: 6.0 standard drinks    Types: 3 Glasses of wine, 3 Cans of beer per week     Comment: socially  . Drug use: No    Review of Systems  Constitutional: No fever/chills Eyes: No visual changes. ENT: No sore throat. Cardiovascular: Denies chest pain. Respiratory: Denies shortness of breath. Gastrointestinal:no vomiting.  No diarrhea.  No constipation. Genitourinary: As above Musculoskeletal: Negative for back pain. Skin: Negative for rash. Neurological: Negative for headaches, focal weakness or numbness.   ____________________________________________   PHYSICAL EXAM:  VITAL SIGNS: ED Triage Vitals  Enc Vitals Group     BP 09/08/18 1250 128/81     Pulse Rate 09/08/18 1250 97     Resp 09/08/18 1250 20     Temp 09/08/18 1250 97.8 F (36.6 C)     Temp Source 09/08/18 1250 Oral     SpO2 09/08/18 1250 99 %     Weight 09/08/18 1250 155 lb (70.3 kg)     Height 09/08/18 1250 '5\' 10"'  (1.778 m)     Head Circumference --      Peak Flow --      Pain Score 09/08/18 1259 8     Pain Loc --      Pain Edu? --      Excl. in [place]? --     Constitutional: Alert and oriented. Well appearing and in no acute distress. Eyes: Conjunctivae are normal.  Head: Atraumatic. Nose: No congestion/rhinnorhea. Mouth/Throat: Mucous membranes are moist.  Neck: No stridor.   Cardiovascular: Normal rate, regular rhythm. Grossly normal heart sounds.  Good peripheral circulation. Respiratory: Normal respiratory effort.  No retractions. Lungs CTAB. Gastrointestinal: Soft and nontender. No distention. No CVA tenderness. Musculoskeletal: No lower extremity tenderness nor edema.  No joint effusions. Neurologic:  Normal speech and language. No gross focal neurologic deficits are appreciated. Skin:  Skin is warm, dry and intact. No rash noted. Psychiatric: Mood and affect are normal. Speech and behavior are normal.  ____________________________________________   LABS (all labs ordered are listed, but only abnormal results are displayed)  Labs Reviewed  WET PREP, GENITAL - Abnormal;  Notable for the following components:      Result Value   Clue Cells Wet Prep HPF POC PRESENT (*)    WBC, Wet Prep HPF POC MODERATE (*)    All other components within normal limits  COMPREHENSIVE METABOLIC PANEL - Abnormal; Notable for the following components:   Calcium 8.8 (*)    Alkaline Phosphatase 30 (*)    All other components within normal limits  URINALYSIS, COMPLETE (UACMP) WITH MICROSCOPIC - Abnormal; Notable for the following components:   Color, Urine YELLOW (*)    APPearance HAZY (*)    Bacteria, UA RARE (*)  All other components within normal limits  CHLAMYDIA/NGC RT PCR ([facility] ONLY)  LIPASE, BLOOD  CBC  POC URINE PREG, ED  POCT PREGNANCY, URINE   ____________________________________________  EKG   ____________________________________________  RADIOLOGY  Ultrasound with retroverted uterus and 2 small fibroids.  CT of the abdomen without any acute intra-abdominal process.  Normal appendix.  Unchanged fibroid uterus. ____________________________________________   PROCEDURES  Procedure(s) performed:   Procedures  Critical Care performed:   ____________________________________________   INITIAL IMPRESSION / ASSESSMENT AND PLAN / ED COURSE  Pertinent labs & imaging results that were available during my care of the patient were reviewed by me and considered in my medical decision making (see chart for details).  Differential diagnosis includes, but is not limited to, ovarian cyst, ovarian torsion, acute appendicitis, diverticulitis, urinary tract infection/pyelonephritis, endometriosis, bowel obstruction, colitis, renal colic, gastroenteritis, hernia, fibroids, endometriosis, pregnancy related pain including ectopic pregnancy, etc. As part of my medical decision making, I reviewed the following data within the electronic MEDICAL RECORD NUMBER Notes from prior ED visits  Patient sees Dr. Glennon Mac at [facility]  OB[facility]N.  ----------------------------------------- 4:57 PM on 09/08/2018 -----------------------------------------  Patient at this time requesting pain medication.  Ultrasound does not seem to reveal a cause for the patient's pain.  We will proceed with CAT scan.  ----------------------------------------- 6:17 PM on 09/08/2018 -----------------------------------------  Patient at this time with pain is a 2 out of 10.  His pain does not worsen with movement.  As for pain is at this time and seems to localize between the ASIS and the pubic symphysis.  I do not feel any bulging or indicative of femoral hernia.  Also discussed case Dr. Glennon Mac who reviewed the ultrasound does not see any acute process that is obvious from the imaging.  Patient to be treated for BV as well as given diclofenac gel to treat over the area for the localized pain.  She will be following up with Dr. Glennon Mac.  Knows to return for any worsening or concerning symptoms. ____________________________________________   FINAL CLINICAL IMPRESSION(S) / ED DIAGNOSES  Final diagnoses:  Pelvic pain  Pelvic pain   Bacterial vaginosis   NEW MEDICATIONS STARTED DURING THIS VISIT:  New Prescriptions   No medications on file     Note:  This document was prepared using Dragon voice recognition software and may include unintentional dictation errors.     Orbie Pyo, MD 09/08/18 [phone]

## 2018-09-08 NOTE — ED Triage Notes (Signed)
Pt arrives to ED c/o R pelvic pain since Sunday. "reddish discharge." denies bleeding. Nausea. Hx of ovarian cyst, endometrial ablation a few years ago.

## 2018-09-10 ENCOUNTER — Ambulatory Visit (INDEPENDENT_AMBULATORY_CARE_PROVIDER_SITE_OTHER): Payer: Medicaid Other | Admitting: Obstetrics and Gynecology

## 2018-09-10 ENCOUNTER — Encounter: Payer: Self-pay | Admitting: Obstetrics and Gynecology

## 2018-09-10 VITALS — BP 122/82 | Ht 70.0 in | Wt 160.0 lb

## 2018-09-10 DIAGNOSIS — R1031 Right lower quadrant pain: Secondary | ICD-10-CM

## 2018-09-10 NOTE — Progress Notes (Signed)
Obstetrics & Gynecology Office Visit   Chief Complaint  Patient presents with  . Follow-up    ER Follow Up  Right lower quadrant pain  History of Present Illness: 37 y.o. G31P2002 female who presents in follow up from an ER visit two days ago.  The pain started 4 days ago. It is located on her right lower quadrant.  The pain is described as sharp.  It does not radiate. Nothing alleviates the pain.  Nothing aggravates the pain.  She has tried a heating pad that hasn't helped.  She was given pain medication while in the ER, which did help.  Since that time she has been taking ibuprofen. Associated symptoms include, nausea (without emesis). She denies diarrhea, constipation. She had a bloody discharge on Sunday.  She had a normal CT scan of her abdomen and pelvis and pelvic ultrasound.  She was diagnosed with BV and was given flagyl. She was also given Voltaren.   Voltaren does not really work.   The pain is about the same.  She rates the pain about 6/10.  Her testing in the ER also revealed a normal CMP, CBC, negative gonorrhea and chlamydia.   Past Medical History:  Diagnosis Date  . Allergy   . Anemia   . Anxiety   . BRCA negative 02/2018   MyRisk neg  . Endometriosis   . Family history of breast cancer 02/2018   My Risk neg  . Headache    h/o migraines  . Increased risk of breast cancer 02/2018   IBIS=18.8%/riskscore=26.3%  . Ovarian cyst   . Ovarian cyst   . PTSD (post-traumatic stress disorder)   . PTSD (post-traumatic stress disorder)   . Thrombocytopenia ([place])   . Vitamin D deficiency     Past Surgical History:  Procedure Laterality Date  . DILITATION & CURRETTAGE/HYSTROSCOPY WITH NOVASURE ABLATION  12/07/2015   Procedure: DILATATION & CURETTAGE/HYSTEROSCOPY WITH NOVASURE ABLATION;  Surgeon: Will Bonnet, MD;  Location: [facility] ORS;  Service: Gynecology;;  . MOUTH SURGERY    . TONSILLECTOMY    . TUBAL LIGATION      Gynecologic History: No LMP recorded. Patient has had  an ablation.  Obstetric History: [id number]  Family History  Problem Relation Age of Onset  . Breast cancer Paternal Aunt 51  . Cancer Paternal Aunt        Breast  . Breast cancer Paternal Grandmother 70  . Stroke Paternal Grandmother   . Clotting disorder Paternal Grandmother   . Cancer Paternal Grandmother        Breast  . Depression Mother   . Depression Sister   . Alcohol abuse Maternal Grandmother   . Stroke Maternal Grandmother   . Depression Maternal Grandmother   . Cancer Maternal Grandmother        Liver  . Alcohol abuse Maternal Grandfather   . Stroke Maternal Grandfather   . Cancer Maternal Grandfather        Liver  . Stroke Paternal Grandfather   . Heart disease Paternal Grandfather     Social History   Socioeconomic History  . Marital status: Single    Spouse name: Not on file  . Number of children: Not on file  . Years of education: Not on file  . Highest education level: Not on file  Occupational History  . Not on file  Social Needs  . Financial resource strain: Not on file  . Food insecurity:    Worry: Not on file  Inability: Not on file  . Transportation needs:    Medical: Not on file    Non-medical: Not on file  Tobacco Use  . Smoking status: Current Every Day Smoker    Packs/day: 0.50    Years: 13.00    Pack years: 6.50    Types: Cigarettes  . Smokeless tobacco: Never Used  Substance and Sexual Activity  . Alcohol use: Yes    Alcohol/week: 6.0 standard drinks    Types: 3 Glasses of wine, 3 Cans of beer per week    Comment: socially  . Drug use: No  . Sexual activity: Yes    Birth control/protection: Surgical  Lifestyle  . Physical activity:    Days per week: Not on file    Minutes per session: Not on file  . Stress: Not on file  Relationships  . Social connections:    Talks on phone: Not on file    Gets together: Not on file    Attends religious service: Not on file    Active member of club or organization: Not on file     Attends meetings of clubs or organizations: Not on file    Relationship status: Not on file  . Intimate partner violence:    Fear of current or ex partner: Not on file    Emotionally abused: Not on file    Physically abused: Not on file    Forced sexual activity: Not on file  Other Topics Concern  . Not on file  Social History Narrative  . Not on file   Allergies: No Known Allergies  Prior to Admission medications   Medication Sig Start Date End Date Taking? Authorizing Provider  diclofenac sodium (VOLTAREN) 1 % GEL Use up to 4 times a day to the affected area as needed for pain. 09/08/18  Yes Schaevitz, Randall An, MD  metroNIDAZOLE (FLAGYL) 500 MG tablet Take 1 tablet (500 mg total) by mouth 2 (two) times daily for 14 days. 09/08/18 09/22/18 Yes Schaevitz, Randall An, MD  BIOTIN PO Take 1 Dose by mouth daily.    [provider]  clotrimazole (LOTRIMIN) 1 % cream Apply 1 application topically 2 (two) times daily. Patient not taking: Reported on 09/10/2018 06/10/18   Volney American, PA-C  doxycycline (VIBRA-TABS) 100 MG tablet Take 1 tablet (100 mg total) by mouth 2 (two) times daily. Patient not taking: Reported on 09/10/2018 06/10/18   Volney American, PA-C  ondansetron (ZOFRAN ODT) 4 MG disintegrating tablet Take 1 tablet (4 mg total) by mouth every 8 (eight) hours as needed. Patient not taking: Reported on 09/10/2018 06/18/18   Volney American, PA-C    Review of Systems  Constitutional: Positive for malaise/fatigue. Negative for chills, diaphoresis, fever and weight loss.       Decreased appetite  HENT: Negative.   Eyes: Negative.   Respiratory: Negative.   Cardiovascular: Negative.   Gastrointestinal: Positive for abdominal pain, nausea and vomiting. Negative for blood in stool, constipation, diarrhea and melena.  Genitourinary: Positive for frequency. Negative for dysuria, flank pain, hematuria and urgency.  Musculoskeletal: Negative.     Skin: Negative.   Neurological: Positive for headaches. Negative for dizziness, tingling, tremors, sensory change, speech change, focal weakness, seizures, loss of consciousness and weakness.  Endo/Heme/Allergies: Negative.   Psychiatric/Behavioral: Negative for depression, hallucinations, memory loss, substance abuse and suicidal ideas. The patient is nervous/anxious. The patient does not have insomnia.      Physical Exam BP 122/82   Ht 5'  10" (1.778 m)   Wt 160 lb (72.6 kg)   BMI 22.96 kg/m  No LMP recorded. Patient has had an ablation. Physical Exam  Constitutional: She is oriented to person, place, and time. She appears well-developed and well-nourished. No distress.  Eyes: EOM are normal. No scleral icterus.  Neck: Normal range of motion. Neck supple.  Cardiovascular: Normal rate and regular rhythm.  Pulmonary/Chest: Effort normal and breath sounds normal. No respiratory distress. She has no wheezes. She has no rales.  Abdominal: Soft. Bowel sounds are normal. She exhibits no distension and no mass. There is tenderness. There is no rebound, no guarding, no CVA tenderness and negative Murphy's sign. No hernia. Hernia confirmed negative in the right inguinal area.    Musculoskeletal: Normal range of motion. She exhibits no edema.  Neurological: She is alert and oriented to person, place, and time. No cranial nerve deficit.  Skin: Skin is warm and dry. No erythema.  Psychiatric: She has a normal mood and affect. Her behavior is normal. Judgment normal.    Female chaperone present for pelvic and breast  portions of the physical exam  Assessment: 37 y.o. G78P2002 female here for  1. Right lower quadrant abdominal pain      Plan: Problem List Items Addressed This Visit      Other   Right lower quadrant abdominal pain - Primary     Discussed multiple possible etiologies for her pain.  From a gynecologic standpoint, she is at risk for post ablation syndrome with her history of  tubal ligation and ablation.  She had presented in April for pain symptoms that were in her lower abdomen both on left and right sides, but not specific.  Her symptoms today could be consistent with post ablation syndrome.  Discussed that if her pain continues, the only way to diagnose for sure is to perform a laparoscopy or perform a hysterectomy as there is no evidence of this on ultrasound or CT scan.  Patient needs follow-up on her abnormal ultrasound from earlier this year.  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  However, my preference would be my recommendation to her was to go ahead and have the colposcopy.  She was scheduled for a colposcopy and she did not show for this appointment.  The patient also needs an appointment for review of genetic screening testing given her family history.  However today was an acute visit.  She voiced understanding and agreement for follow-up.  20 minutes spent in face to face discussion with > 50% spent in counseling,management, and coordination of care of her right lower quadrant abdominal pain.  Prentice Docker, MD 09/10/2018 1:03 PM

## 2018-09-25 ENCOUNTER — Other Ambulatory Visit: Payer: Self-pay

## 2018-09-25 ENCOUNTER — Ambulatory Visit (INDEPENDENT_AMBULATORY_CARE_PROVIDER_SITE_OTHER): Payer: Medicaid Other | Admitting: Family Medicine

## 2018-09-25 ENCOUNTER — Encounter: Payer: Self-pay | Admitting: Family Medicine

## 2018-09-25 VITALS — BP 102/67 | HR 63 | Temp 98.7°F | Ht 70.0 in | Wt 159.0 lb

## 2018-09-25 DIAGNOSIS — R1031 Right lower quadrant pain: Secondary | ICD-10-CM

## 2018-09-25 DIAGNOSIS — F339 Major depressive disorder, recurrent, unspecified: Secondary | ICD-10-CM | POA: Diagnosis not present

## 2018-09-25 MED ORDER — CLONAZEPAM 0.5 MG PO TABS: 0.5000 mg | ORAL_TABLET | Freq: Two times a day (BID) | ORAL | 0 refills | Status: DC | PRN

## 2018-09-25 MED ORDER — VORTIOXETINE HBR 10 MG PO TABS: ORAL_TABLET | ORAL | 3 refills | Status: DC

## 2018-09-25 NOTE — Assessment & Plan Note (Signed)
Not doing well. Has failed many medicines (see note) will try her on trintellix, Recheck 1 month. Klonopin PRN for now. Call with any concerns.

## 2018-09-25 NOTE — Assessment & Plan Note (Signed)
Continue to follow with Dr. Glennon Mac- to have ex lap coming up. Call with any concerns.

## 2018-09-25 NOTE — Progress Notes (Addendum)
BP 102/67   Pulse 63   Temp 98.7 F (37.1 C) (Oral)   Ht 5\' 10"  (1.778 m)   Wt 159 lb (72.1 kg)   SpO2 100%   BMI 22.81 kg/m    Subjective:    Patient ID: Jessica Peters, female    DOB: 05/22/1981, 37 y.o.   MRN: [id number]  HPI: Jessica Peters is a 37 y.o. female  Chief Complaint  Patient presents with  . Anxiety    x about a month  . Depression  . Fatigue  . Abdominal Pain    lower right abdomen x 3 weeks/ pt states went to the ER about 2 weeks ago   ANXIETY/DEPRESSION- has been under a huge amount of stress. 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 She just lost her license due to DUI, wellbutrin, buspar, pristiq, zoloft,- bad reactions to all of them.  Duration:exacerbated Anxious mood: yes  Excessive worrying: yes Irritability: no  Sweating: no Nausea: no Palpitations:no Hyperventilation: no Panic attacks: yes Agoraphobia: no  Obscessions/compulsions: no Depressed mood: yes Depression screen [facility] 2/9 09/25/2018 01/05/2018 06/13/2017  Decreased Interest 3 0 0  Down, Depressed, Hopeless 3 0 0  PHQ - 2 Score 6 0 0  Altered sleeping 3 - 2  Tired, decreased energy 3 - 0  Change in appetite 3 - 0  Feeling bad or failure about yourself  3 - 0  Trouble concentrating 0 - 0  Moving slowly or fidgety/restless 2 - 0  Suicidal thoughts 0 - 0  PHQ-9 Score 20 - 2  Difficult doing work/chores Extremely dIfficult - -   GAD 7 : Generalized Anxiety Score 09/25/2018 06/13/2017  Nervous, Anxious, on Edge 3 1  Control/stop worrying 3 2  Worry too much - different things 3 2  Trouble relaxing 3 1  Restless 0 1  Easily annoyed or irritable 0 1  Afraid - awful might happen 2 0  Total GAD 7 Score 14 8  Anxiety Difficulty Very difficult Somewhat difficult   Anhedonia: no Weight changes: no Insomnia: yes hard to fall asleep  Hypersomnia: yes Fatigue/loss of energy: yes Feelings of worthlessness: yes Feelings of  guilt: yes Impaired concentration/indecisiveness: yes Suicidal ideations: no  Crying spells: yes Recent Stressors/Life Changes: yes   Relationship problems: yes   Family stress: yes     Financial stress: yes    Job stress: yes    Recent death/loss: no  Abdominal pain- was in the ER on 09/08/18 for abdominal pain. Normal CT except for fibroids. Followed up with her GYN, Dr. Glennon Mac. 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 She is going back to see him for colposcopy and needs a follow up on an [place]. She is due to go back to see him, but does not have a follow up appointment scheduled.   Relevant past medical, surgical, family and social history reviewed and updated as indicated. Interim medical history since our last visit reviewed. Allergies and medications reviewed and updated.  Review of Systems  Constitutional: Negative.   Respiratory: Negative.   Cardiovascular: Negative.   Gastrointestinal: Positive for abdominal pain. Negative for abdominal distention, anal bleeding, blood in stool, constipation, diarrhea, nausea, rectal pain and vomiting.  Musculoskeletal: Negative.   Skin: Negative.   Psychiatric/Behavioral: Positive for decreased concentration, dysphoric mood and sleep disturbance. Negative for agitation, behavioral problems, confusion, hallucinations, self-injury and suicidal ideas. The patient is nervous/anxious. The patient is not  hyperactive.     Per HPI unless specifically indicated above     Objective:    BP 102/67   Pulse 63   Temp 98.7 F (37.1 C) (Oral)   Ht 5\' 10"  (1.778 m)   Wt 159 lb (72.1 kg)   SpO2 100%   BMI 22.81 kg/m   Wt Readings from Last 3 Encounters:  09/25/18 159 lb (72.1 kg)  09/10/18 160 lb (72.6 kg)  09/08/18 155 lb (70.3 kg)    Physical Exam  Constitutional: She is oriented to person, place, and time. She appears well-developed and well-nourished. She appears distressed.  HENT:  Head: Normocephalic and atraumatic.  Right  Ear: Hearing normal.  Left Ear: Hearing normal.  Nose: Nose normal.  Eyes: Conjunctivae and lids are normal. Right eye exhibits no discharge. Left eye exhibits no discharge. No scleral icterus.  Cardiovascular: Normal rate, regular rhythm, normal heart sounds and intact distal pulses. Exam reveals no gallop and no friction rub.  No murmur heard. Pulmonary/Chest: Effort normal and breath sounds normal. No stridor. No respiratory distress. She has no wheezes. She has no rhonchi. She has no rales. She exhibits no tenderness.  Musculoskeletal: Normal range of motion.  Neurological: She is alert and oriented to person, place, and time.  Skin: Skin is warm, dry and intact. Capillary refill takes less than 2 seconds. No rash noted. She is not diaphoretic. No cyanosis or erythema. No pallor.  Psychiatric: Her speech is normal and behavior is normal. Judgment and thought content normal. Her mood appears anxious. Cognition and memory are normal. She exhibits a depressed mood.    Results for orders placed or performed during the hospital encounter of 09/08/18  Wet prep, genital  Result Value Ref Range   Yeast Wet Prep HPF POC NONE SEEN NONE SEEN   Trich, Wet Prep NONE SEEN NONE SEEN   Clue Cells Wet Prep HPF POC PRESENT (A) NONE SEEN   WBC, Wet Prep HPF POC MODERATE (A) NONE SEEN   Sperm NONE SEEN   Chlamydia/NGC rt PCR ([facility] only)  Result Value Ref Range   Specimen source GC/Chlam ENDOCERVICAL    Chlamydia Tr NOT DETECTED NOT DETECTED   N gonorrhoeae NOT DETECTED NOT DETECTED  Lipase, blood  Result Value Ref Range   Lipase 25 11 - 51 U/L  Comprehensive metabolic panel  Result Value Ref Range   Sodium 140 135 - 145 mmol/L   Potassium 3.8 3.5 - 5.1 mmol/L   Chloride 106 98 - 111 mmol/L   CO2 25 22 - 32 mmol/L   Glucose, Bld 99 70 - 99 mg/dL   BUN 15 6 - 20 mg/dL   Creatinine, Ser 0.72 0.44 - 1.00 mg/dL   Calcium 8.8 (L) 8.9 - 10.3 mg/dL   Total Protein 7.3 6.5 - 8.1 g/dL   Albumin 4.3 3.5  - 5.0 g/dL   AST 17 15 - 41 U/L   ALT 13 0 - 44 U/L   Alkaline Phosphatase 30 (L) 38 - 126 U/L   Total Bilirubin 0.7 0.3 - 1.2 mg/dL   GFR calc non Af Amer >60 >60 mL/min   GFR calc Af Amer >60 >60 mL/min   Anion gap 9 5 - 15  CBC  Result Value Ref Range   WBC 6.3 4.0 - 10.5 K/uL   RBC 4.80 3.87 - 5.11 MIL/uL   Hemoglobin 14.6 12.0 - 15.0 g/dL   HCT 45.1 36.0 - 46.0 %   MCV 94.0 80.0 - 100.0  fL   MCH 30.4 26.0 - 34.0 pg   MCHC 32.4 30.0 - 36.0 g/dL   RDW 11.9 11.5 - 15.5 %   Platelets 165 150 - 400 K/uL   nRBC 0.0 0.0 - 0.2 %  Urinalysis, Complete w Microscopic  Result Value Ref Range   Color, Urine YELLOW (A) YELLOW   APPearance HAZY (A) CLEAR   Specific Gravity, Urine 1.016 1.005 - 1.030   pH 6.0 5.0 - 8.0   Glucose, UA NEGATIVE NEGATIVE mg/dL   Hgb urine dipstick NEGATIVE NEGATIVE   Bilirubin Urine NEGATIVE NEGATIVE   Ketones, ur NEGATIVE NEGATIVE mg/dL   Protein, ur NEGATIVE NEGATIVE mg/dL   Nitrite NEGATIVE NEGATIVE   Leukocytes, UA NEGATIVE NEGATIVE   RBC / HPF 0-5 0 - 5 RBC/hpf   WBC, UA 0-5 0 - 5 WBC/hpf   Bacteria, UA RARE (A) NONE SEEN   Squamous Epithelial / LPF 6-10 0 - 5   Mucus PRESENT   Pregnancy, urine POC  Result Value Ref Range   Preg Test, Ur NEGATIVE NEGATIVE      Assessment & Plan:   Problem List Items Addressed This Visit      Other   Depression, recurrent ([place]) - Primary    Not doing well. Has failed many medicines (see note) will try her on trintellix, Recheck 1 month. Klonopin PRN for now. Call with any concerns.      Relevant Medications   vortioxetine HBr (TRINTELLIX) 10 MG TABS tablet   Right lower quadrant abdominal pain    Continue to follow with Dr. Glennon Mac- to have ex lap coming up. Call with any concerns.           Follow up plan: Return in about 4 weeks (around 10/23/2018) for Follow up mood.

## 2018-09-29 ENCOUNTER — Telehealth: Payer: Self-pay

## 2018-09-29 NOTE — Telephone Encounter (Signed)
PA for Trintellix submitted via  [employer]. Will await determination.  Preferred medications are citalopram, escitalopram, fluoxetine, paroxetine, sertraline, duloxetine, or venlafaxine.

## 2018-09-30 NOTE — Telephone Encounter (Signed)
PA approved.

## 2018-10-19 ENCOUNTER — Ambulatory Visit (INDEPENDENT_AMBULATORY_CARE_PROVIDER_SITE_OTHER): Payer: Medicaid Other | Admitting: Obstetrics and Gynecology

## 2018-10-19 ENCOUNTER — Encounter: Payer: Self-pay | Admitting: Obstetrics and Gynecology

## 2018-10-19 VITALS — BP 118/74 | Ht 69.0 in | Wt 158.0 lb

## 2018-10-19 DIAGNOSIS — R1031 Right lower quadrant pain: Secondary | ICD-10-CM | POA: Diagnosis not present

## 2018-10-19 NOTE — Progress Notes (Signed)
Obstetrics & Gynecology Office Visit   Chief Complaint  Patient presents with  . Pelvic Pain    History of Present Illness: 37 y.o. G83P2002 female who presents with pain that started about 2-2.5 weeks ago. Her pain is located in the same area. This area is her right lower quadrant area.  The pain is described as sharp and constant.  The pain does not radiate.  She rates the pain as a 9/10 at worst.  She rates it a 3/10 today.  Alleviating factors; none. She has tried a heating pad.  She has tried ibuprofen, but it doesn't really touch the pain.  Aggravating factors; none.  Associated symptoms: none. Denies nausea, bowel changes, blood in stool, blood in urine, urinary symptoms.  She denies fevers. She thinks maybe she has some chills.  She has had a workup for this on 09/08/18 that was essentially negative.  The pain resolved slowly. But, has returned.  She has noted nothing protruding in this area. She has had no bleeding since her ablation.  She feels somewhat swollen in the area of tenderness. She had normal CT and pelvic ultrasound imaging (2 small fibroids) at that time.  She states that for the last several months her pain has been cyclical and occurring for two weeks each months with regularity.   She has started taking an antidepressant medication since her last visit.  Her medication is stabilizing her mood.  Dr. Park Liter is prescribing this medication. She has a follow up in the morning with Dr. Wynetta Emery for her medication.    Pap smear history:   03/09/2018: ASCUS-HPV negative, she did not follow up for colposcopy despite repeated attempts to contact her.  03/14/2017: Colposcopy: CIN 1, no evidence of high-grade dysplasia with four biopsies.  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 Allergy   . Anemia   . Anxiety   . BRCA negative 02/2018   MyRisk neg  . Endometriosis   . Family history of breast cancer  02/2018   My Risk neg  . Headache    h/o migraines  . Increased risk of breast cancer 02/2018   IBIS=18.8%/riskscore=26.3%  . Ovarian cyst   . Ovarian cyst   . PTSD (post-traumatic stress disorder)   . PTSD (post-traumatic stress disorder)   . Thrombocytopenia ([place])   . Vitamin D deficiency     Past Surgical History:  Procedure Laterality Date  . DILITATION & CURRETTAGE/HYSTROSCOPY WITH NOVASURE ABLATION  12/07/2015   Procedure: DILATATION & CURETTAGE/HYSTEROSCOPY WITH NOVASURE ABLATION;  Surgeon: Will Bonnet, MD;  Location: [facility] ORS;  Service: Gynecology;;  . MOUTH SURGERY    . TONSILLECTOMY    . TUBAL LIGATION      Gynecologic History: No LMP recorded. Patient has had an ablation.  Obstetric History: G2P2002, s/p SVD x 2  Family History  Problem Relation Age of Onset  . Breast cancer Paternal Aunt 33  . Cancer Paternal Aunt        Breast  . Breast cancer Paternal Grandmother 29  . Stroke Paternal Grandmother   . Clotting disorder Paternal Grandmother   . Cancer Paternal Grandmother        Breast  . Depression Mother   . Depression Sister   . Alcohol abuse Maternal Grandmother   . Stroke Maternal Grandmother   . Depression Maternal Grandmother   . Cancer Maternal Grandmother        Liver  .  Alcohol abuse Maternal Grandfather   . Stroke Maternal Grandfather   . Cancer Maternal Grandfather        Liver  . Stroke Paternal Grandfather   . Heart disease Paternal Grandfather     Social History   Socioeconomic History  . Marital status: Single    Spouse name: Not on file  . Number of children: Not on file  . Years of education: Not on file  . Highest education level: Not on file  Occupational History  . Not on file  Social Needs  . Financial resource strain: Not on file  . Food insecurity:    Worry: Not on file    Inability: Not on file  . Transportation needs:    Medical: Not on file    Non-medical: Not on file  Tobacco Use  . Smoking status:  Current Every Day Smoker    Packs/day: 0.50    Years: 13.00    Pack years: 6.50    Types: Cigarettes  . Smokeless tobacco: Never Used  Substance and Sexual Activity  . Alcohol use: Yes    Alcohol/week: 6.0 standard drinks    Types: 3 Glasses of wine, 3 Cans of beer per week    Comment: socially  . Drug use: No  . Sexual activity: Yes    Birth control/protection: Surgical  Lifestyle  . Physical activity:    Days per week: Not on file    Minutes per session: Not on file  . Stress: Not on file  Relationships  . Social connections:    Talks on phone: Not on file    Gets together: Not on file    Attends religious service: Not on file    Active member of club or organization: Not on file    Attends meetings of clubs or organizations: Not on file    Relationship status: Not on file  . Intimate partner violence:    Fear of current or ex partner: Not on file    Emotionally abused: Not on file    Physically abused: Not on file    Forced sexual activity: Not on file  Other Topics Concern  . Not on file  Social History Narrative  . Not on file    No Known Allergies  Prior to Admission medications   Medication Sig Start Date End Date Taking? Authorizing Provider  clonazePAM (KLONOPIN) 0.5 MG tablet Take 1 tablet (0.5 mg total) by mouth 2 (two) times daily as needed for anxiety. 09/25/18   Johnson, Megan P, DO  vortioxetine HBr (TRINTELLIX) 10 MG TABS tablet 1/2 tab daily for 1 week, then increase to 1 tab daily 09/25/18   Park Liter P, DO    Review of Systems  Constitutional: Negative.   HENT: Negative.   Eyes: Negative.   Respiratory: Negative.   Cardiovascular: Negative.   Gastrointestinal: Positive for abdominal pain. Negative for blood in stool, constipation, diarrhea, heartburn, melena, nausea and vomiting.  Genitourinary: Negative.   Musculoskeletal: Negative.   Skin: Negative.   Neurological: Negative.   Psychiatric/Behavioral: Negative.      Physical Exam BP  118/74   Ht '5\' 9"'  (1.753 m)   Wt 158 lb (71.7 kg)   BMI 23.33 kg/m  No LMP recorded. Patient has had an ablation. Physical Exam  Constitutional: She is oriented to person, place, and time. She appears well-developed and well-nourished. No distress.  HENT:  Head: Normocephalic and atraumatic.  Eyes: Conjunctivae are normal. No scleral icterus.  Neck: Normal range  of motion. Neck supple. No thyromegaly present.  Cardiovascular: Normal rate and regular rhythm. Exam reveals no gallop and no friction rub.  No murmur heard. Pulmonary/Chest: Effort normal and breath sounds normal. No respiratory distress. She has no wheezes. She has no rales.  Abdominal: Soft. Bowel sounds are normal. She exhibits no distension and no mass. There is tenderness (mild RLQ, mostly along inguinal ligament area, no evidence of hernia.  Pain not reduced by flexing abdominal musculature). There is no rebound and no guarding. No hernia.  Musculoskeletal: Normal range of motion. She exhibits no edema.  Lymphadenopathy:    She has no cervical adenopathy.  Neurological: She is oriented to person, place, and time. No cranial nerve deficit.  Skin: Skin is warm and dry. No erythema.  Psychiatric: She has a normal mood and affect. Her behavior is normal. Judgment normal.  Pelvic exam: Deferred  Assessment: 37 y.o. [id number] female here for  1. Right lower quadrant abdominal pain      Plan: Problem List Items Addressed This Visit      Other   Right lower quadrant abdominal pain - Primary     Her pain is chronic in nature.  We have previously discussed her abdominal pain from a gynecologic perspective.  We have discussed that there could be a gastrointestinal, urologic, musculoskeletal, and/or psychiatric component to her pain.  From a gynecologic perspective, she has no obvious etiology.  She has had a tubal ligation and endometrial ablation.  So, a post ablation syndrome is a possibility.  We discussed various methods for  treatment of this possibility.  This included, simple bilateral salpingectomy versus hysterectomy.  Based on discussion of risks and benefits, she elects to undergo a hysterectomy.  We discussed that while this is possibly the source of her pain given the cyclicity of her symptoms and the location, I could not guarantee that a hysterectomy would alleviate her symptoms.  She voiced understanding of this possibility and agreement to proceed with this understanding.  We will schedule for total laparoscopic hysterectomy, bilateral salpingectomy, and cystoscopy.  20 minutes spent in face to face discussion with > 50% spent in counseling,management, and coordination of care of her right lower quadrant abdominal pain.   Prentice Docker, MD 10/20/2018 1:28 PM

## 2018-10-20 ENCOUNTER — Encounter: Payer: Self-pay | Admitting: Obstetrics and Gynecology

## 2018-10-20 ENCOUNTER — Encounter: Payer: Self-pay | Admitting: Family Medicine

## 2018-10-20 ENCOUNTER — Ambulatory Visit (INDEPENDENT_AMBULATORY_CARE_PROVIDER_SITE_OTHER): Payer: Medicaid Other | Admitting: Family Medicine

## 2018-10-20 VITALS — BP 114/77 | HR 60 | Wt 147.0 lb

## 2018-10-20 DIAGNOSIS — F339 Major depressive disorder, recurrent, unspecified: Secondary | ICD-10-CM

## 2018-10-20 DIAGNOSIS — R202 Paresthesia of skin: Secondary | ICD-10-CM

## 2018-10-20 DIAGNOSIS — F419 Anxiety disorder, unspecified: Secondary | ICD-10-CM | POA: Diagnosis not present

## 2018-10-20 MED ORDER — VORTIOXETINE HBR 10 MG PO TABS: 10.0000 mg | ORAL_TABLET | Freq: Every day | ORAL | 1 refills | Status: DC

## 2018-10-20 NOTE — Patient Instructions (Signed)

## 2018-10-20 NOTE — Assessment & Plan Note (Signed)
Doing much better. Continue current regimen. Continue to monitor. Call with any concerns. Has taken about 5 of he klonopin. Rx should last at least 6 months.

## 2018-10-20 NOTE — Progress Notes (Signed)
BP 114/77   Pulse 60   Wt 147 lb (66.7 kg)   SpO2 100%   BMI 21.71 kg/m    Subjective:    Patient ID: Jessica Peters, female    DOB: [birth year], 38 y.o.   MRN: [id number]  HPI: Jessica Peters is a 37 y.o. female  Chief Complaint  Patient presents with  . Follow-up    Mood; stable. Denies side effects to medication Trintillex   ANXIETY/DEPRESSION Duration:controlled Anxious mood: no  Excessive worrying: no Irritability: no  Sweating: no Nausea: no Palpitations:no Hyperventilation: no Panic attacks: no Agoraphobia: no  Obscessions/compulsions: no Depressed mood: no Depression screen [facility] 2/9 10/20/2018 09/25/2018 01/05/2018 06/13/2017  Decreased Interest 0 3 0 0  Down, Depressed, Hopeless 0 3 0 0  PHQ - 2 Score 0 6 0 0  Altered sleeping 2 3 - 2  Tired, decreased energy 1 3 - 0  Change in appetite 0 3 - 0  Feeling bad or failure about yourself  0 3 - 0  Trouble concentrating 0 0 - 0  Moving slowly or fidgety/restless 0 2 - 0  Suicidal thoughts 0 0 - 0  PHQ-9 Score 3 20 - 2  Difficult doing work/chores Not difficult at all Extremely dIfficult - -   Anhedonia: no Weight changes: no Insomnia: no   Hypersomnia: no Fatigue/loss of energy: yes Feelings of worthlessness: no Feelings of guilt: no Impaired concentration/indecisiveness: no Suicidal ideations: no  Crying spells: no Recent Stressors/Life Changes: no   Relationship problems: no   Family stress: no     Financial stress: no    Job stress: no    Recent death/loss: no   Relevant past medical, surgical, family and social history reviewed and updated as indicated. Interim medical history since our last visit reviewed. Allergies and medications reviewed and updated.  Review of Systems  Constitutional: Negative.   Respiratory: Negative.   Cardiovascular: Positive for leg swelling. Negative for chest pain and palpitations.  Musculoskeletal: Positive for back pain. Negative for arthralgias, gait  problem, joint swelling, myalgias, neck pain and neck stiffness.  Skin: Negative.   Neurological: Positive for numbness. Negative for dizziness, tremors, seizures, syncope, facial asymmetry, speech difficulty, weakness, light-headedness and headaches.  Psychiatric/Behavioral: Negative.     Per HPI unless specifically indicated above     Objective:    BP 114/77   Pulse 60   Wt 147 lb (66.7 kg)   SpO2 100%   BMI 21.71 kg/m   Wt Readings from Last 3 Encounters:  10/20/18 147 lb (66.7 kg)  10/19/18 158 lb (71.7 kg)  09/25/18 159 lb (72.1 kg)    Physical Exam  Constitutional: She is oriented to person, place, and time. She appears well-developed and well-nourished. No distress.  HENT:  Head: Normocephalic and atraumatic.  Right Ear: Hearing normal.  Left Ear: Hearing normal.  Nose: Nose normal.  Eyes: Conjunctivae and lids are normal. Right eye exhibits no discharge. Left eye exhibits no discharge. No scleral icterus.  Cardiovascular: Normal rate, regular rhythm, normal heart sounds and intact distal pulses. Exam reveals no gallop and no friction rub.  No murmur heard. Pulmonary/Chest: Effort normal and breath sounds normal. No stridor. No respiratory distress. She has no wheezes. She has no rales. She exhibits no tenderness.  Musculoskeletal: Normal range of motion.  Neurological: She is alert and oriented to person, place, and time.  Skin: Skin is warm, dry and intact. Capillary refill takes less than 2 seconds. No rash noted.  She is not diaphoretic. No erythema. No pallor.  Psychiatric: She has a normal mood and affect. Her speech is normal and behavior is normal. Judgment and thought content normal. Cognition and memory are normal.  Nursing note and vitals reviewed.   Results for orders placed or performed during the hospital encounter of 09/08/18  Wet prep, genital  Result Value Ref Range   Yeast Wet Prep HPF POC NONE SEEN NONE SEEN   Trich, Wet Prep NONE SEEN NONE SEEN    Clue Cells Wet Prep HPF POC PRESENT (A) NONE SEEN   WBC, Wet Prep HPF POC MODERATE (A) NONE SEEN   Sperm NONE SEEN   Chlamydia/NGC rt PCR ([facility] only)  Result Value Ref Range   Specimen source GC/Chlam ENDOCERVICAL    Chlamydia Tr NOT DETECTED NOT DETECTED   N gonorrhoeae NOT DETECTED NOT DETECTED  Lipase, blood  Result Value Ref Range   Lipase 25 11 - 51 U/L  Comprehensive metabolic panel  Result Value Ref Range   Sodium 140 135 - 145 mmol/L   Potassium 3.8 3.5 - 5.1 mmol/L   Chloride 106 98 - 111 mmol/L   CO2 25 22 - 32 mmol/L   Glucose, Bld 99 70 - 99 mg/dL   BUN 15 6 - 20 mg/dL   Creatinine, Ser 0.72 0.44 - 1.00 mg/dL   Calcium 8.8 (L) 8.9 - 10.3 mg/dL   Total Protein 7.3 6.5 - 8.1 g/dL   Albumin 4.3 3.5 - 5.0 g/dL   AST 17 15 - 41 U/L   ALT 13 0 - 44 U/L   Alkaline Phosphatase 30 (L) 38 - 126 U/L   Total Bilirubin 0.7 0.3 - 1.2 mg/dL   GFR calc non Af Amer >60 >60 mL/min   GFR calc Af Amer >60 >60 mL/min   Anion gap 9 5 - 15  CBC  Result Value Ref Range   WBC 6.3 4.0 - 10.5 K/uL   RBC 4.80 3.87 - 5.11 MIL/uL   Hemoglobin 14.6 12.0 - 15.0 g/dL   HCT 45.1 36.0 - 46.0 %   MCV 94.0 80.0 - 100.0 fL   MCH 30.4 26.0 - 34.0 pg   MCHC 32.4 30.0 - 36.0 g/dL   RDW 11.9 11.5 - 15.5 %   Platelets 165 150 - 400 K/uL   nRBC 0.0 0.0 - 0.2 %  Urinalysis, Complete w Microscopic  Result Value Ref Range   Color, Urine YELLOW (A) YELLOW   APPearance HAZY (A) CLEAR   Specific Gravity, Urine 1.016 1.005 - 1.030   pH 6.0 5.0 - 8.0   Glucose, UA NEGATIVE NEGATIVE mg/dL   Hgb urine dipstick NEGATIVE NEGATIVE   Bilirubin Urine NEGATIVE NEGATIVE   Ketones, ur NEGATIVE NEGATIVE mg/dL   Protein, ur NEGATIVE NEGATIVE mg/dL   Nitrite NEGATIVE NEGATIVE   Leukocytes, UA NEGATIVE NEGATIVE   RBC / HPF 0-5 0 - 5 RBC/hpf   WBC, UA 0-5 0 - 5 WBC/hpf   Bacteria, UA RARE (A) NONE SEEN   Squamous Epithelial / LPF 6-10 0 - 5   Mucus PRESENT   Pregnancy, urine POC  Result Value Ref Range   Preg  Test, Ur NEGATIVE NEGATIVE      Assessment & Plan:   Problem List Items Addressed This Visit      Other   Anxiety - Primary    Doing much better. Continue current regimen. Continue to monitor. Call with any concerns. Has taken about 5 of he klonopin. Rx  should last at least 6 months.       Relevant Medications   vortioxetine HBr (TRINTELLIX) 10 MG TABS tablet   Depression, recurrent ([place])    Doing much better. Continue current regimen. Continue to monitor. Call with any concerns. Has taken about 5 of he klonopin. Rx should last at least 6 months.       Relevant Medications   vortioxetine HBr (TRINTELLIX) 10 MG TABS tablet    Other Visit Diagnoses    Paresthesias       Will work on back exercises and continue to monitor. Call with any concerns.        Follow up plan: Return if symptoms worsen or fail to improve.

## 2018-10-21 ENCOUNTER — Telehealth: Payer: Self-pay | Admitting: Obstetrics and Gynecology

## 2018-10-21 NOTE — Telephone Encounter (Signed)
Lmtrc

## 2018-10-21 NOTE — Telephone Encounter (Signed)
-----   Message from Will Bonnet, MD sent at 10/19/2018  5:37 PM EST ----- Regarding: Schedule Surgery Surgery Booking Request Patient Full Name:  Jessica Peters  MRN: [id number]  DOB: [birth year]  Surgeon: Prentice Docker, MD  Requested Surgery Date and Time: TBD Primary Diagnosis AND Code: chronic pelvic pain Secondary Diagnosis and Code:  Surgical Procedure: TLH/BS, cystoscopy L&D Notification: No Admission Status: same day surgery Length of Surgery: 2 hours Special Case Needs: n/a H&P: TBD (date) Phone Interview???: no Interpreter: Language:  Medical Clearance: no Special Scheduling Instructions: none

## 2018-10-26 NOTE — Telephone Encounter (Signed)
Patient was offered all remaining Dec dates, and requested a January date. Patient is aware of H&P at [facility] on 12/04/18 @ 8:10am w Dr Glennon Mac, Pre-admit Testing afterwards, and OR on 12/10/18. Patient is aware she may receive calls from the [facility] and [facility]. Patient confirmed Medicaid and no other insurance, and said she expects to have Medicaid next year. Patient has my phone# and ext.

## 2018-10-26 NOTE — Telephone Encounter (Signed)
Lmtrc

## 2018-12-04 ENCOUNTER — Encounter
Admission: RE | Admit: 2018-12-04 | Discharge: 2018-12-04 | Disposition: A | Discharge status: Home / Self Care | Payer: Medicaid Other | Source: Ambulatory Visit | Attending: Obstetrics and Gynecology | Admitting: Obstetrics and Gynecology

## 2018-12-04 ENCOUNTER — Encounter: Payer: Self-pay | Admitting: Obstetrics and Gynecology

## 2018-12-04 ENCOUNTER — Other Ambulatory Visit: Payer: Self-pay

## 2018-12-04 ENCOUNTER — Ambulatory Visit (INDEPENDENT_AMBULATORY_CARE_PROVIDER_SITE_OTHER): Payer: Medicaid Other | Admitting: Obstetrics and Gynecology

## 2018-12-04 VITALS — BP 100/60 | HR 86 | Ht 70.0 in | Wt 160.0 lb

## 2018-12-04 DIAGNOSIS — G8929 Other chronic pain: Secondary | ICD-10-CM

## 2018-12-04 DIAGNOSIS — Z01812 Encounter for preprocedural laboratory examination: Secondary | ICD-10-CM | POA: Diagnosis not present

## 2018-12-04 DIAGNOSIS — R102 Pelvic and perineal pain: Secondary | ICD-10-CM

## 2018-12-04 DIAGNOSIS — R1031 Right lower quadrant pain: Secondary | ICD-10-CM | POA: Diagnosis not present

## 2018-12-04 LAB — COMPREHENSIVE METABOLIC PANEL
ALBUMIN: 4.3 g/dL (ref 3.5–5.0)
ALT: 19 U/L (ref 0–44)
AST: 19 U/L (ref 15–41)
Alkaline Phosphatase: 27 U/L — ABNORMAL LOW (ref 38–126)
Anion gap: 8 (ref 5–15)
BUN: 11 mg/dL (ref 6–20)
CHLORIDE: 102 mmol/L (ref 98–111)
CO2: 25 mmol/L (ref 22–32)
CREATININE: 0.58 mg/dL (ref 0.44–1.00)
Calcium: 8.8 mg/dL — ABNORMAL LOW (ref 8.9–10.3)
GFR calc Af Amer: >60 mL/min (ref >60)
GFR calc non Af Amer: >60 mL/min (ref >60)
Glucose, Bld: 89 mg/dL (ref 70–99)
POTASSIUM: 3.6 mmol/L (ref 3.5–5.1)
SODIUM: 135 mmol/L (ref 135–145)
Total Bilirubin: 0.6 mg/dL (ref 0.3–1.2)
Total Protein: 7 g/dL (ref 6.5–8.1)

## 2018-12-04 LAB — TYPE AND SCREEN
ABO/RH(D): O POS
Antibody Screen: NEGATIVE

## 2018-12-04 LAB — CBC
HEMATOCRIT: 42.2 % (ref 36.0–46.0)
HEMOGLOBIN: 13.5 g/dL (ref 12.0–15.0)
MCH: 30.3 pg (ref 26.0–34.0)
MCHC: 32 g/dL (ref 30.0–36.0)
MCV: 94.8 fL (ref 80.0–100.0)
Platelets: 157 10*3/uL (ref 150–400)
RBC: 4.45 MIL/uL (ref 3.87–5.11)
RDW: 12 % (ref 11.5–15.5)
WBC: 8.1 10*3/uL (ref 4.0–10.5)
nRBC: 0 % (ref 0.0–0.2)

## 2018-12-04 NOTE — H&P (View-Only) (Signed)
Preoperative History and Physical  Jessica Peters is a 38 y.o. [phone] here for surgical management of chronic pelvic pain and right lower quadrant abdominal pain.   No significant preoperative concerns.  History of Present Illness: 38 y.o. G41P2002 female who presents for preoperative visit for pelvic pain. This area is her right lower quadrant area.  The pain is described as sharp and constant.  The pain does not radiate.  She rates the pain as a 9/10 at worst.  She rates it a 3/10 today.  Alleviating factors; none. She has tried a heating pad.  She has tried ibuprofen, but it doesn't really touch the pain.  Aggravating factors; none.  Associated symptoms: none. Denies nausea, bowel changes, blood in stool, blood in urine, urinary symptoms.  She denies fevers. She thinks maybe she has some chills.  She has had a workup for this on 09/08/18 that was essentially negative.  The pain resolved slowly. But, has returned.  She has noted nothing protruding in this area. She has had no bleeding since her ablation.  She feels somewhat swollen in the area of tenderness. She had normal CT and pelvic ultrasound imaging (2 small fibroids) at that time.  She states that for the last several months her pain has been cyclical and occurring for two weeks each months with regularity.   She has started taking an antidepressant medication since her last visit.  Her medication is stabilizing her mood.  Dr. Park Liter is prescribing this medication. She has a follow up in the morning with Dr. Wynetta Emery for her medication.    Pap smear history:              03/09/2018: ASCUS-HPV negative, she did not follow up for colposcopy despite repeated attempts to contact her.             03/14/2017: Colposcopy: CIN 1, no evidence of high-grade dysplasia with four biopsies.             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 Allergy   . Anemia   . Anxiety   . BRCA negative 02/2018   MyRisk neg  . Endometriosis   . Family history of breast cancer 02/2018   My Risk neg  . Headache    h/o migraines  . Increased risk of breast cancer 02/2018   IBIS=18.8%/riskscore=26.3%  . Ovarian cyst   . Ovarian cyst   . PTSD (post-traumatic stress disorder)   . PTSD (post-traumatic stress disorder)   . Thrombocytopenia ([place])   . Vitamin D deficiency    Past Surgical History:  Procedure Laterality Date  . DILITATION & CURRETTAGE/HYSTROSCOPY WITH NOVASURE ABLATION  12/07/2015   Procedure: DILATATION & CURETTAGE/HYSTEROSCOPY WITH NOVASURE ABLATION;  Surgeon: Will Bonnet, MD;  Location: [facility] ORS;  Service: Gynecology;;  . MOUTH SURGERY    . TONSILLECTOMY    . TUBAL LIGATION     OB History  Gravida Para Term Preterm AB Living  _0 SAB TAB Ectopic Multiple Live Births          2    # Outcome Date GA Lbr Len/2nd Weight Sex Delivery Anes PTL Lv  2 Term           1 Term           Patient denies  any other pertinent gynecologic issues.   Current Outpatient Medications on File Prior to Visit  Medication Sig Dispense Refill  . clonazePAM (KLONOPIN) 0.5 MG tablet Take 1 tablet (0.5 mg total) by mouth 2 (two) times daily as needed for anxiety. 60 tablet 0  . vortioxetine HBr (TRINTELLIX) 10 MG TABS tablet Take 1 tablet (10 mg total) by mouth daily. 90 tablet 1   No current facility-administered medications on file prior to visit.    No Known Allergies  Social History:   reports that she has been smoking cigarettes. She has a 6.50 pack-year smoking history. She has never used smokeless tobacco. She reports current alcohol use of about 6.0 standard drinks of alcohol per week. She reports that she does not use drugs.  Family History  Problem Relation Age of Onset  . Breast cancer Paternal Aunt 60  . Cancer Paternal Aunt         Breast  . Breast cancer Paternal Grandmother 13  . Stroke Paternal Grandmother   . Clotting disorder Paternal Grandmother   . Cancer Paternal Grandmother        Breast  . Depression Mother   . Depression Sister   . Alcohol abuse Maternal Grandmother   . Stroke Maternal Grandmother   . Depression Maternal Grandmother   . Cancer Maternal Grandmother        Liver  . Alcohol abuse Maternal Grandfather   . Stroke Maternal Grandfather   . Cancer Maternal Grandfather        Liver  . Stroke Paternal Grandfather   . Heart disease Paternal Grandfather     Review of Systems: Noncontributory  PHYSICAL EXAM: Blood pressure 100/60, pulse 86, height '5\' 10"'$  (1.778 m), weight 160 lb (72.6 kg). CONSTITUTIONAL: Well-developed, well-nourished female in no acute distress.  HENT:  Normocephalic, atraumatic, External right and left ear normal. Oropharynx is clear and moist EYES: Conjunctivae and EOM are normal. Pupils are equal, round, and reactive to light. No scleral icterus.  NECK: Normal range of motion, supple, no masses SKIN: Skin is warm and dry. No rash noted. Not diaphoretic. No erythema. No pallor. [place]: Alert and oriented to person, place, and time. Normal reflexes, muscle tone coordination. No cranial nerve deficit noted. PSYCHIATRIC: Normal mood and affect. Normal behavior. Normal judgment and thought content. CARDIOVASCULAR: Normal heart rate noted, regular rhythm RESPIRATORY: Effort and breath sounds normal, no problems with respiration noted ABDOMEN: Soft, nontender, nondistended. PELVIC: Deferred MUSCULOSKELETAL: Normal range of motion. No edema and no tenderness. 2+ distal pulses.  Labs: No results found for this or any previous visit (from the past 336 hour(s)).  Imaging Studies: No results found.  Assessment: 1. Right lower quadrant abdominal pain   2. Chronic pelvic pain in female      Plan: Patient will undergo surgical management with the above surgery.   The  risks of surgery were discussed in detail with the patient including but not limited to: bleeding which may require transfusion or reoperation; infection which may require antibiotics; injury to surrounding organs which may involve bowel, bladder, ureters ; need for additional procedures including laparoscopy or laparotomy; thromboembolic phenomenon, surgical site problems and other postoperative/anesthesia complications. Likelihood of success in alleviating the patient's condition was discussed. Routine postoperative instructions will be reviewed with the patient and her family in detail after surgery.  The patient concurred with the proposed plan, giving informed written consent for the surgery.  Preoperative prophylactic antibiotics, as indicated, and SCDs ordered on call to the OR.  Prentice Docker, MD 12/04/2018 8:29 AM

## 2018-12-04 NOTE — Patient Instructions (Signed)
Total Laparoscopic Hysterectomy  A total laparoscopic hysterectomy is a minimally invasive surgery to remove the uterus and cervix. The fallopian tubes and ovaries can also be removed (bilateral salpingo-oophorectomy) during this surgery, if necessary. This procedure may be done to treat problems such as:   Noncancerous growths in the uterus (uterine fibroids) that cause symptoms.   A condition that causes the lining of the uterus (endometrium) to grow in other areas (endometriosis).   Problems with pelvic support. This is caused by weakened muscles of the pelvis following vaginal childbirth or menopause.   Cancer of the cervix, ovaries, uterus, or endometrium.   Excessive (dysfunctional) uterine bleeding.  This surgery is performed by inserting a thin, lighted tube (laparoscope) and surgical instruments into small incisions in the abdomen. The laparoscope sends images to a monitor. The images help the health care provider perform the procedure. After this procedure, you will no longer be able to have a baby, and you will no longer have a menstrual period.  Tell a health care provider about:   Any allergies you have.   All medicines you are taking, including vitamins, herbs, eye drops, creams, and over-the-counter medicines.   Any problems you or family members have had with anesthetic medicines.   Any blood disorders you have.   Any surgeries you have had.   Any medical conditions you have.   Whether you are pregnant or may be pregnant.  What are the risks?  Generally, this is a safe procedure. However, problems may occur, including:   Infection.   Bleeding.   Blood clots in the legs or lungs.   Allergic reactions to medicines.   Damage to other structures or organs.   The risk that the surgery may have to be switched to the regular one in which a large incision is made in the abdomen (abdominal hysterectomy).  What happens before the procedure?  Staying hydrated  Follow instructions from your  health care provider about hydration, which may include:   Up to 2 hours before the procedure - you may continue to drink clear liquids, such as water, clear fruit juice, black coffee, and plain tea  Eating and drinking restrictions  Follow instructions from your health care provider about eating and drinking, which may include:   8 hours before the procedure - stop eating heavy meals or foods such as meat, fried foods, or fatty foods.   6 hours before the procedure - stop eating light meals or foods, such as toast or cereal.   6 hours before the procedure - stop drinking milk or drinks that contain milk.   2 hours before the procedure - stop drinking clear liquids.  Medicines   Ask your health care provider about:  ? Changing or stopping your regular medicines. This is especially important if you are taking diabetes medicines or blood thinners.  ? Taking over-the-counter medicines, vitamins, herbs, and supplements.  ? Taking medicines such as aspirin and ibuprofen. These medicines can thin your blood. Do not take these medicines unless your health care provider tells you to take them.   You may be given antibiotic medicine to help prevent infection.   You may be asked to take laxatives.   You may be given medicines to help prevent nausea and vomiting after the procedure.  General instructions   Ask your health care provider how your surgical site will be marked or identified.   You may be asked to shower with a germ-killing soap.   Do not   use any products that contain nicotine or tobacco, such as cigarettes and e-cigarettes. If you need help quitting, ask your health care provider.   You may have an exam or testing, such as an ultrasound to determine the size and shape of your pelvic organs.   You may have a blood or urine sample taken.   This procedure can affect the way you feel about yourself. Talk with your health care provider about the physical and emotional changes hysterectomy may  cause.   Plan to have someone take you home from the hospital or clinic.   Plan to have a responsible adult care for you for at least 24 hours after you leave the hospital or clinic. This is important.  What happens during the procedure?   To lower your risk of infection:  ? Your health care team will wash or sanitize their hands.  ? Your skin will be washed with soap.  ? Hair may be removed from the surgical area.   An IV will be inserted into one of your veins.   You will be given one or more of the following:  ? A medicine to help you relax (sedative).  ? A medicine to make you fall asleep (general anesthetic).   You will be given antibiotic medicine through your IV.   A tube may be inserted down your throat to help you breathe during the procedure.   A gas (carbon dioxide) will be used to inflate your abdomen to allow your surgeon to see inside of your abdomen.   Three or four small incisions will be made in your abdomen.   A laparoscope will be inserted into one of your incisions. Surgical instruments will be inserted through the other incisions in order to perform the procedure.   Your uterus and cervix may be removed through your vagina or cut into small pieces and removed through the small incisions. Any other organs that need to be removed will also be removed this way.   Carbon dioxide will be released from inside of your abdomen.   Your incisions will be closed with stitches (sutures).   A bandage (dressing) may be placed over your incisions.  The procedure may vary among health care providers and hospitals.  What happens after the procedure?   Your blood pressure, heart rate, breathing rate, and blood oxygen level will be monitored until the medicines you were given have worn off.   You will be given medicine for pain and nausea as needed.   Do not drive for 24 hours if you received a sedative.  Summary   Total Laparoscopic hysterectomy is a procedure to remove your uterus, cervix and  sometimes the fallopian tubes and ovaries.   This procedure can affect the way you feel about yourself. Talk with your health care provider about the physical and emotional changes hysterectomy may cause.   After this procedure, you will no longer be able to have a baby, and you will no longer have a menstrual period.   You will be given pain medicine to control discomfort after this procedure.  This information is not intended to replace advice given to you by your health care provider. Make sure you discuss any questions you have with your health care provider.  Document Released: 09/08/2007 Document Revised: 01/22/2017 Document Reviewed: 01/22/2017  Elsevier Interactive Patient Education  2019 [employer]

## 2018-12-04 NOTE — Progress Notes (Signed)
Preoperative History and Physical  Jessica Peters is a 38 y.o. [phone] here for surgical management of chronic pelvic pain and right lower quadrant abdominal pain.   No significant preoperative concerns.  History of Present Illness: Jessica Peters who presents for preoperative visit for pelvic pain. This area is her right lower quadrant area.  The pain is described as sharp and constant.  The pain does not radiate.  She rates the pain as a 9/10 at worst.  She rates it a 3/10 today.  Alleviating factors; none. She has tried a heating pad.  She has tried ibuprofen, but it doesn't really touch the pain.  Aggravating factors; none.  Associated symptoms: none. Denies nausea, bowel changes, blood in stool, blood in urine, urinary symptoms.  She denies fevers. She thinks maybe she has some chills.  She has had a workup for this on 09/08/18 that was essentially negative.  The pain resolved slowly. But, has returned.  She has noted nothing protruding in this area. She has had no bleeding since her ablation.  She feels somewhat swollen in the area of tenderness. She had normal CT and pelvic ultrasound imaging (2 small fibroids) at that time.  She states that for the last several months her pain has been cyclical and occurring for two weeks each months with regularity.   She has started taking an antidepressant medication since her last visit.  Her medication is stabilizing her mood.  Dr. Park Liter is prescribing this medication. She has a follow up in the morning with Dr. Wynetta Emery for her medication.    Pap smear history:              03/09/2018: ASCUS-HPV negative, she did not follow up for colposcopy despite repeated attempts to contact her.             03/14/2017: Colposcopy: CIN 1, no evidence of high-grade dysplasia with four biopsies.             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 Allergy   . Anemia   . Anxiety   . BRCA negative 02/2018   MyRisk neg  . Endometriosis   . Family history of breast cancer 02/2018   My Risk neg  . Headache    h/o migraines  . Increased risk of breast cancer 02/2018   IBIS=18.8%/riskscore=26.3%  . Ovarian cyst   . Ovarian cyst   . PTSD (post-traumatic stress disorder)   . PTSD (post-traumatic stress disorder)   . Thrombocytopenia ([place])   . Vitamin D deficiency    Past Surgical History:  Procedure Laterality Date  . DILITATION & CURRETTAGE/HYSTROSCOPY WITH NOVASURE ABLATION  12/07/2015   Procedure: DILATATION & CURETTAGE/HYSTEROSCOPY WITH NOVASURE ABLATION;  Surgeon: Will Bonnet, MD;  Location: [facility] ORS;  Service: Gynecology;;  . MOUTH SURGERY    . TONSILLECTOMY    . TUBAL LIGATION     OB History  Gravida Para Term Preterm AB Living  _0 SAB TAB Ectopic Multiple Live Births          2    # Outcome Date GA Lbr Len/2nd Weight Sex Delivery Anes PTL Lv  2 Term           1 Term           Patient denies  any other pertinent gynecologic issues.   Current Outpatient Medications on File Prior to Visit  Medication Sig Dispense Refill  . clonazePAM (KLONOPIN) 0.5 MG tablet Take 1 tablet (0.5 mg total) by mouth 2 (two) times daily as needed for anxiety. 60 tablet 0  . vortioxetine HBr (TRINTELLIX) 10 MG TABS tablet Take 1 tablet (10 mg total) by mouth daily. 90 tablet 1   No current facility-administered medications on file prior to visit.    No Known Allergies  Social History:   reports that she has been smoking cigarettes. She has a 6.50 pack-year smoking history. She has never used smokeless tobacco. She reports current alcohol use of about 6.0 standard drinks of alcohol per week. She reports that she does not use drugs.  Family History  Problem Relation Age of Onset  . Breast cancer Paternal Aunt 79  . Cancer Paternal Aunt         Breast  . Breast cancer Paternal Grandmother 89  . Stroke Paternal Grandmother   . Clotting disorder Paternal Grandmother   . Cancer Paternal Grandmother        Breast  . Depression Mother   . Depression Sister   . Alcohol abuse Maternal Grandmother   . Stroke Maternal Grandmother   . Depression Maternal Grandmother   . Cancer Maternal Grandmother        Liver  . Alcohol abuse Maternal Grandfather   . Stroke Maternal Grandfather   . Cancer Maternal Grandfather        Liver  . Stroke Paternal Grandfather   . Heart disease Paternal Grandfather     Review of Systems: Noncontributory  PHYSICAL EXAM: Blood pressure 100/60, pulse 86, height '5\' 10"'$  (1.778 m), weight 160 lb (72.6 kg). CONSTITUTIONAL: Well-developed, well-nourished Peters in no acute distress.  HENT:  Normocephalic, atraumatic, External right and left ear normal. Oropharynx is clear and moist EYES: Conjunctivae and EOM are normal. Pupils are equal, round, and reactive to light. No scleral icterus.  NECK: Normal range of motion, supple, no masses SKIN: Skin is warm and dry. No rash noted. Not diaphoretic. No erythema. No pallor. [place]: Alert and oriented to person, place, and time. Normal reflexes, muscle tone coordination. No cranial nerve deficit noted. PSYCHIATRIC: Normal mood and affect. Normal behavior. Normal judgment and thought content. CARDIOVASCULAR: Normal heart rate noted, regular rhythm RESPIRATORY: Effort and breath sounds normal, no problems with respiration noted ABDOMEN: Soft, nontender, nondistended. PELVIC: Deferred MUSCULOSKELETAL: Normal range of motion. No edema and no tenderness. 2+ distal pulses.  Labs: No results found for this or any previous visit (from the past 336 hour(s)).  Imaging Studies: No results found.  Assessment: 1. Right lower quadrant abdominal pain   2. Chronic pelvic pain in Peters      Plan: Patient will undergo surgical management with the above surgery.   The  risks of surgery were discussed in detail with the patient including but not limited to: bleeding which may require transfusion or reoperation; infection which may require antibiotics; injury to surrounding organs which may involve bowel, bladder, ureters ; need for additional procedures including laparoscopy or laparotomy; thromboembolic phenomenon, surgical site problems and other postoperative/anesthesia complications. Likelihood of success in alleviating the patient's condition was discussed. Routine postoperative instructions will be reviewed with the patient and her family in detail after surgery.  The patient concurred with the proposed plan, giving informed written consent for the surgery.  Preoperative prophylactic antibiotics, as indicated, and SCDs ordered on call to the OR.  Prentice Docker, MD 12/04/2018 8:29 AM

## 2018-12-04 NOTE — Patient Instructions (Signed)
Your procedure is scheduled on: Thursday 12/10/2018  Report to [place]. To find out your arrival time please call [phone] between 1PM - 3PM on Friday 12/11/2018   Remember: Instructions that are not followed completely may result in serious medical risk, up to and including death, or upon the discretion of your surgeon and anesthesiologist your surgery may need to be rescheduled.      _X__ 1. Do not eat food after midnight the night before your procedure.                 No gum chewing or hard candies. You may drink clear liquids up to 2 hours                 before you are scheduled to arrive for your surgery- DO NOT drink clear                 liquids within 2 hours of the start of your surgery.                 Clear Liquids include:  water, apple juice without pulp, clear carbohydrate                 drink such as Clearfast or Gatorade, Black Coffee or Tea (Do not add                 anything to coffee or tea).   __X__2.  On the morning of surgery brush your teeth with toothpaste and water, you may rinse your mouth with mouthwash if you wish.  Do not swallow any toothpaste or mouthwash.      _X__ 3.  No Alcohol for 24 hours before or after surgery.    _X__ 4.  Do Not Smoke or use e-cigarettes For 24 Hours Prior to Your Surgery.                 Do not use any chewable tobacco products for at least 6 hours prior to                 surgery.   __X__5.  Notify your doctor if there is any change in your medical condition      (cold, fever, infections).      Do not wear jewelry, make-up, hairpins, clips or nail polish. Do not wear lotions, powders, or perfumes.  Do not shave 48 hours prior to surgery. Men may shave face and neck. Do not bring valuables to the hospital.     [facility] is not responsible for any belongings or valuables.   Contacts, dentures/partials or body piercings may not be worn into surgery.  Bring a case for your contacts, glasses or hearing aids, a denture cup will be supplied.   Leave your suitcase in the car. After surgery it may be brought to your room.   For patients admitted to the hospital, discharge time is determined by your treatment team.    Patients discharged the day of surgery will not be allowed to drive home.    Please read over the following fact sheets that you were given:   MRSA Information  __X__ Take these medicines the morning of surgery with A SIP OF WATER:     1. clonazePAM (KLONOPIN) 0.5 MG tablet  2. May take Tylenol if needed for pain    __X__ Use CHG Soap as directed   __X__ Stop Blood Thinners Coumadin/Plavix/Xarelto/Pleta/Pradaxa/Eliquis/Effient/Aspirin  __X__ Stop Anti-inflammatories TODAY such as Advil, Ibuprofen, Motrin, BC or Goodies Powder, Naprosyn, Naproxen, Aleve, Aspirin, Meloxicam. May take Tylenol if needed for pain or discomfort.    __X__ Do not start any herbal supplements before your procedure.   __X__ Go ahead and get a stool softener and gas-ex to have on hand.

## 2018-12-09 MED ORDER — CEFAZOLIN SODIUM-DEXTROSE 2-4 GM/100ML-% IV SOLN
2.0000 g | INTRAVENOUS | Status: AC
  Administered 2018-12-10: 2 g via INTRAVENOUS

## 2018-12-10 ENCOUNTER — Observation Stay
Admission: RE | Admit: 2018-12-10 | Discharge: 2018-12-11 | Disposition: A | Discharge status: Home / Self Care | Payer: Medicaid Other | Attending: Obstetrics and Gynecology | Admitting: Obstetrics and Gynecology

## 2018-12-10 ENCOUNTER — Other Ambulatory Visit: Payer: Self-pay

## 2018-12-10 ENCOUNTER — Ambulatory Visit: Payer: Medicaid Other | Admitting: Anesthesiology

## 2018-12-10 ENCOUNTER — Encounter
Admission: RE | Disposition: A | Discharge status: Home / Self Care | Payer: Self-pay | Source: Home / Self Care | Attending: Obstetrics and Gynecology

## 2018-12-10 DIAGNOSIS — R1031 Right lower quadrant pain: Secondary | ICD-10-CM | POA: Diagnosis not present

## 2018-12-10 DIAGNOSIS — Z9851 Tubal ligation status: Secondary | ICD-10-CM | POA: Diagnosis not present

## 2018-12-10 DIAGNOSIS — Z90710 Acquired absence of both cervix and uterus: Secondary | ICD-10-CM

## 2018-12-10 DIAGNOSIS — N879 Dysplasia of cervix uteri, unspecified: Secondary | ICD-10-CM | POA: Insufficient documentation

## 2018-12-10 DIAGNOSIS — Z79899 Other long term (current) drug therapy: Secondary | ICD-10-CM | POA: Diagnosis not present

## 2018-12-10 DIAGNOSIS — R102 Pelvic and perineal pain unspecified side: Secondary | ICD-10-CM | POA: Diagnosis present

## 2018-12-10 DIAGNOSIS — D252 Subserosal leiomyoma of uterus: Secondary | ICD-10-CM | POA: Diagnosis not present

## 2018-12-10 DIAGNOSIS — G8929 Other chronic pain: Secondary | ICD-10-CM | POA: Diagnosis not present

## 2018-12-10 DIAGNOSIS — F329 Major depressive disorder, single episode, unspecified: Secondary | ICD-10-CM | POA: Insufficient documentation

## 2018-12-10 DIAGNOSIS — N800 Endometriosis of uterus: Secondary | ICD-10-CM | POA: Insufficient documentation

## 2018-12-10 DIAGNOSIS — D649 Anemia, unspecified: Secondary | ICD-10-CM | POA: Insufficient documentation

## 2018-12-10 DIAGNOSIS — F419 Anxiety disorder, unspecified: Secondary | ICD-10-CM | POA: Insufficient documentation

## 2018-12-10 DIAGNOSIS — F17210 Nicotine dependence, cigarettes, uncomplicated: Secondary | ICD-10-CM | POA: Diagnosis not present

## 2018-12-10 DIAGNOSIS — G894 Chronic pain syndrome: Secondary | ICD-10-CM | POA: Diagnosis not present

## 2018-12-10 HISTORY — PX: LAPAROSCOPIC HYSTERECTOMY: SHX1926

## 2018-12-10 HISTORY — PX: CYSTOSCOPY: SHX5120

## 2018-12-10 LAB — POCT PREGNANCY, URINE: PREG TEST UR: NEGATIVE

## 2018-12-10 SURGERY — HYSTERECTOMY, TOTAL, LAPAROSCOPIC
Anesthesia: General | Laterality: Bilateral

## 2018-12-10 MED ORDER — DEXAMETHASONE SODIUM PHOSPHATE 10 MG/ML IJ SOLN
INTRAMUSCULAR | Status: DC | PRN
  Administered 2018-12-10: 5 mg via INTRAVENOUS

## 2018-12-10 MED ORDER — SUCCINYLCHOLINE CHLORIDE 20 MG/ML IJ SOLN
INTRAMUSCULAR | Status: AC
  Filled 2018-12-10: qty 1

## 2018-12-10 MED ORDER — MIDAZOLAM HCL 2 MG/2ML IJ SOLN
INTRAMUSCULAR | Status: AC
  Filled 2018-12-10: qty 2

## 2018-12-10 MED ORDER — OXYCODONE-ACETAMINOPHEN 5-325 MG PO TABS
2.0000 | ORAL_TABLET | ORAL | Status: DC | PRN
  Administered 2018-12-10 – 2018-12-11 (×5): 2 via ORAL
  Filled 2018-12-10 (×6): qty 2

## 2018-12-10 MED ORDER — HYDROMORPHONE HCL 1 MG/ML IJ SOLN
INTRAMUSCULAR | Status: AC
  Administered 2018-12-10: 0.5 mg via INTRAVENOUS
  Filled 2018-12-10: qty 1

## 2018-12-10 MED ORDER — ONDANSETRON HCL 4 MG/2ML IJ SOLN
4.0000 mg | INTRAMUSCULAR | Status: AC
  Administered 2018-12-10: 4 mg via INTRAVENOUS

## 2018-12-10 MED ORDER — FENTANYL CITRATE (PF) 100 MCG/2ML IJ SOLN
INTRAMUSCULAR | Status: AC
  Administered 2018-12-10: 25 ug via INTRAVENOUS
  Filled 2018-12-10: qty 2

## 2018-12-10 MED ORDER — HYDROMORPHONE HCL 1 MG/ML IJ SOLN
1.0000 mg | INTRAMUSCULAR | Status: DC | PRN
  Administered 2018-12-10: 1 mg via INTRAVENOUS
  Filled 2018-12-10: qty 1

## 2018-12-10 MED ORDER — FAMOTIDINE 20 MG PO TABS
ORAL_TABLET | ORAL | Status: AC
  Administered 2018-12-10: 20 mg via ORAL
  Filled 2018-12-10: qty 1

## 2018-12-10 MED ORDER — ROCURONIUM BROMIDE 50 MG/5ML IV SOLN
INTRAVENOUS | Status: AC
  Filled 2018-12-10: qty 1

## 2018-12-10 MED ORDER — DEXAMETHASONE SODIUM PHOSPHATE 10 MG/ML IJ SOLN
INTRAMUSCULAR | Status: AC
  Filled 2018-12-10: qty 1

## 2018-12-10 MED ORDER — ONDANSETRON HCL 4 MG/2ML IJ SOLN
4.0000 mg | Freq: Four times a day (QID) | INTRAMUSCULAR | Status: DC | PRN
  Filled 2018-12-10: qty 2

## 2018-12-10 MED ORDER — PROMETHAZINE HCL 25 MG/ML IJ SOLN: 6.2500 mg | INTRAMUSCULAR | Status: DC | PRN

## 2018-12-10 MED ORDER — ONDANSETRON HCL 4 MG/2ML IJ SOLN
INTRAMUSCULAR | Status: AC
  Filled 2018-12-10: qty 2

## 2018-12-10 MED ORDER — EPHEDRINE SULFATE 50 MG/ML IJ SOLN
INTRAMUSCULAR | Status: AC
  Filled 2018-12-10: qty 1

## 2018-12-10 MED ORDER — PROPOFOL 500 MG/50ML IV EMUL
INTRAVENOUS | Status: AC
  Filled 2018-12-10: qty 50

## 2018-12-10 MED ORDER — MEPERIDINE HCL 50 MG/ML IJ SOLN: 6.2500 mg | INTRAMUSCULAR | Status: DC | PRN

## 2018-12-10 MED ORDER — FAMOTIDINE 20 MG PO TABS
20.0000 mg | ORAL_TABLET | Freq: Once | ORAL | Status: AC
  Administered 2018-12-10: 20 mg via ORAL

## 2018-12-10 MED ORDER — FENTANYL CITRATE (PF) 100 MCG/2ML IJ SOLN
INTRAMUSCULAR | Status: AC
  Filled 2018-12-10: qty 2

## 2018-12-10 MED ORDER — SCOPOLAMINE 1 MG/3DAYS TD PT72
MEDICATED_PATCH | TRANSDERMAL | Status: AC
  Filled 2018-12-10: qty 1

## 2018-12-10 MED ORDER — MIDAZOLAM HCL 2 MG/2ML IJ SOLN
INTRAMUSCULAR | Status: DC | PRN
  Administered 2018-12-10: 2 mg via INTRAVENOUS

## 2018-12-10 MED ORDER — CLONAZEPAM 0.5 MG PO TABS: 0.5000 mg | ORAL_TABLET | Freq: Two times a day (BID) | ORAL | Status: DC | PRN

## 2018-12-10 MED ORDER — ACETAMINOPHEN NICU IV SYRINGE 10 MG/ML
INTRAVENOUS | Status: AC
  Filled 2018-12-10: qty 1

## 2018-12-10 MED ORDER — CEFAZOLIN SODIUM-DEXTROSE 2-4 GM/100ML-% IV SOLN
INTRAVENOUS | Status: AC
  Filled 2018-12-10: qty 100

## 2018-12-10 MED ORDER — ONDANSETRON HCL 4 MG/2ML IJ SOLN
INTRAMUSCULAR | Status: DC | PRN
  Administered 2018-12-10: 4 mg via INTRAVENOUS

## 2018-12-10 MED ORDER — LIDOCAINE HCL (PF) 2 % IJ SOLN
INTRAMUSCULAR | Status: AC
  Filled 2018-12-10: qty 10

## 2018-12-10 MED ORDER — SEVOFLURANE IN SOLN
RESPIRATORY_TRACT | Status: AC
  Filled 2018-12-10: qty 250

## 2018-12-10 MED ORDER — OXYCODONE-ACETAMINOPHEN 5-325 MG PO TABS: 1.0000 | ORAL_TABLET | ORAL | Status: DC | PRN

## 2018-12-10 MED ORDER — HYDROMORPHONE HCL 1 MG/ML IJ SOLN
0.2500 mg | INTRAMUSCULAR | Status: DC | PRN
  Administered 2018-12-10 (×4): 0.5 mg via INTRAVENOUS

## 2018-12-10 MED ORDER — ACETAMINOPHEN 10 MG/ML IV SOLN
INTRAVENOUS | Status: DC | PRN
  Administered 2018-12-10: 1000 mg via INTRAVENOUS

## 2018-12-10 MED ORDER — PROMETHAZINE HCL 25 MG/ML IJ SOLN
12.5000 mg | INTRAMUSCULAR | Status: DC | PRN
  Administered 2018-12-10: 12.5 mg via INTRAVENOUS
  Filled 2018-12-10: qty 1

## 2018-12-10 MED ORDER — SCOPOLAMINE 1 MG/3DAYS TD PT72
1.0000 | MEDICATED_PATCH | TRANSDERMAL | Status: DC
  Administered 2018-12-10: 1.5 mg via TRANSDERMAL

## 2018-12-10 MED ORDER — FENTANYL CITRATE (PF) 100 MCG/2ML IJ SOLN
INTRAMUSCULAR | Status: DC | PRN
  Administered 2018-12-10: 25 ug via INTRAVENOUS
  Administered 2018-12-10: 50 ug via INTRAVENOUS
  Administered 2018-12-10: 100 ug via INTRAVENOUS
  Administered 2018-12-10: 50 ug via INTRAVENOUS

## 2018-12-10 MED ORDER — PROPOFOL 500 MG/50ML IV EMUL
INTRAVENOUS | Status: DC | PRN
  Administered 2018-12-10: 20 ug/kg/min via INTRAVENOUS

## 2018-12-10 MED ORDER — EPHEDRINE SULFATE 50 MG/ML IJ SOLN
INTRAMUSCULAR | Status: DC | PRN
  Administered 2018-12-10 (×2): 5 mg via INTRAVENOUS

## 2018-12-10 MED ORDER — PROPOFOL 10 MG/ML IV BOLUS
INTRAVENOUS | Status: DC | PRN
  Administered 2018-12-10: 200 mg via INTRAVENOUS

## 2018-12-10 MED ORDER — LACTATED RINGERS IV SOLN
INTRAVENOUS | Status: DC | PRN
  Administered 2018-12-10 (×2): via INTRAVENOUS

## 2018-12-10 MED ORDER — ONDANSETRON HCL 4 MG PO TABS: 4.0000 mg | ORAL_TABLET | Freq: Four times a day (QID) | ORAL | Status: DC | PRN

## 2018-12-10 MED ORDER — BUPIVACAINE HCL (PF) 0.5 % IJ SOLN
INTRAMUSCULAR | Status: AC
  Filled 2018-12-10: qty 30

## 2018-12-10 MED ORDER — LIDOCAINE HCL (CARDIAC) PF 100 MG/5ML IV SOSY
PREFILLED_SYRINGE | INTRAVENOUS | Status: DC | PRN
  Administered 2018-12-10: 80 mg via INTRAVENOUS

## 2018-12-10 MED ORDER — PHENYLEPHRINE HCL 10 MG/ML IJ SOLN
INTRAMUSCULAR | Status: AC
  Filled 2018-12-10: qty 1

## 2018-12-10 MED ORDER — SIMETHICONE 80 MG PO CHEW
80.0000 mg | CHEWABLE_TABLET | Freq: Four times a day (QID) | ORAL | Status: DC | PRN
  Administered 2018-12-10 – 2018-12-11 (×4): 80 mg via ORAL
  Filled 2018-12-10 (×4): qty 1

## 2018-12-10 MED ORDER — FENTANYL CITRATE (PF) 100 MCG/2ML IJ SOLN
INTRAMUSCULAR | Status: AC
  Administered 2018-12-10: 50 ug via INTRAVENOUS
  Filled 2018-12-10: qty 2

## 2018-12-10 MED ORDER — DOCUSATE SODIUM 100 MG PO CAPS
100.0000 mg | ORAL_CAPSULE | Freq: Two times a day (BID) | ORAL | Status: DC
  Administered 2018-12-10 – 2018-12-11 (×2): 100 mg via ORAL
  Filled 2018-12-10 (×2): qty 1

## 2018-12-10 MED ORDER — SUGAMMADEX SODIUM 200 MG/2ML IV SOLN
INTRAVENOUS | Status: DC | PRN
  Administered 2018-12-10: 150 mg via INTRAVENOUS

## 2018-12-10 MED ORDER — OXYCODONE HCL 5 MG/5ML PO SOLN: 5.0000 mg | Freq: Once | ORAL | Status: DC | PRN

## 2018-12-10 MED ORDER — KETOROLAC TROMETHAMINE 30 MG/ML IJ SOLN
INTRAMUSCULAR | Status: AC
  Filled 2018-12-10: qty 1

## 2018-12-10 MED ORDER — IBUPROFEN 600 MG PO TABS
600.0000 mg | ORAL_TABLET | Freq: Four times a day (QID) | ORAL | Status: DC
  Administered 2018-12-10 – 2018-12-11 (×3): 600 mg via ORAL
  Filled 2018-12-10 (×4): qty 1

## 2018-12-10 MED ORDER — BUPIVACAINE HCL 0.5 % IJ SOLN
INTRAMUSCULAR | Status: DC | PRN
  Administered 2018-12-10: 10 mL

## 2018-12-10 MED ORDER — OXYCODONE HCL 5 MG PO TABS: 5.0000 mg | ORAL_TABLET | Freq: Once | ORAL | Status: DC | PRN

## 2018-12-10 MED ORDER — VORTIOXETINE HBR 5 MG PO TABS
10.0000 mg | ORAL_TABLET | Freq: Every day | ORAL | Status: DC
  Filled 2018-12-10 (×3): qty 2

## 2018-12-10 MED ORDER — PROPOFOL 10 MG/ML IV BOLUS
INTRAVENOUS | Status: AC
  Filled 2018-12-10: qty 20

## 2018-12-10 MED ORDER — KETOROLAC TROMETHAMINE 30 MG/ML IJ SOLN
INTRAMUSCULAR | Status: DC | PRN
  Administered 2018-12-10: 30 mg via INTRAVENOUS

## 2018-12-10 MED ORDER — MENTHOL 3 MG MT LOZG
1.0000 | LOZENGE | OROMUCOSAL | Status: DC | PRN
  Filled 2018-12-10: qty 9

## 2018-12-10 MED ORDER — LACTATED RINGERS IV SOLN
INTRAVENOUS | Status: DC
  Administered 2018-12-10: 12:00:00 via INTRAVENOUS

## 2018-12-10 MED ORDER — FENTANYL CITRATE (PF) 100 MCG/2ML IJ SOLN
25.0000 ug | INTRAMUSCULAR | Status: DC | PRN
  Administered 2018-12-10 (×3): 25 ug via INTRAVENOUS
  Administered 2018-12-10: 50 ug via INTRAVENOUS
  Administered 2018-12-10: 25 ug via INTRAVENOUS

## 2018-12-10 MED ORDER — ROCURONIUM BROMIDE 100 MG/10ML IV SOLN
INTRAVENOUS | Status: DC | PRN
  Administered 2018-12-10: 40 mg via INTRAVENOUS
  Administered 2018-12-10: 10 mg via INTRAVENOUS
  Administered 2018-12-10: 20 mg via INTRAVENOUS
  Administered 2018-12-10: 10 mg via INTRAVENOUS

## 2018-12-10 SURGICAL SUPPLY — 62 items
APPLICATOR ARISTA FLEXITIP XL (MISCELLANEOUS) ×2 IMPLANT
BAG URINE DRAINAGE (UROLOGICAL SUPPLIES) ×3 IMPLANT
BLADE SURG SZ11 CARB STEEL (BLADE) ×3 IMPLANT
CATH FOLEY 2WAY  5CC 16FR (CATHETERS) ×2
CATH ROBINSON RED A/P 16FR (CATHETERS) ×3 IMPLANT
CATH URTH 16FR FL 2W BLN LF (CATHETERS) ×1 IMPLANT
CHLORAPREP W/TINT 26ML (MISCELLANEOUS) ×3 IMPLANT
COVER WAND RF STERILE (DRAPES) ×3 IMPLANT
DEFOGGER SCOPE WARMER CLEARIFY (MISCELLANEOUS) ×3 IMPLANT
DERMABOND ADVANCED (GAUZE/BANDAGES/DRESSINGS) ×2
DERMABOND ADVANCED .7 DNX12 (GAUZE/BANDAGES/DRESSINGS) ×1 IMPLANT
DEVICE SUTURE ENDOST 10MM (ENDOMECHANICALS) ×1 IMPLANT
DRAPE LEGGINS SURG 28X43 STRL (DRAPES) ×3 IMPLANT
DRAPE SHEET LG 3/4 BI-LAMINATE (DRAPES) ×3 IMPLANT
DRAPE UNDER BUTTOCK W/FLU (DRAPES) ×3 IMPLANT
GAUZE 4X4 16PLY RFD (DISPOSABLE) ×3 IMPLANT
GLOVE BIO SURGEON STRL SZ7 (GLOVE) ×11 IMPLANT
GLOVE BIOGEL PI IND STRL 7.5 (GLOVE) ×1 IMPLANT
GLOVE BIOGEL PI INDICATOR 7.5 (GLOVE) ×8
GLOVE INDICATOR 7.5 STRL GRN (GLOVE) ×9 IMPLANT
GOWN STRL REUS W/ TWL LRG LVL3 (GOWN DISPOSABLE) ×3 IMPLANT
GOWN STRL REUS W/ TWL XL LVL3 (GOWN DISPOSABLE) ×1 IMPLANT
GOWN STRL REUS W/TWL LRG LVL3 (GOWN DISPOSABLE) ×6
GOWN STRL REUS W/TWL XL LVL3 (GOWN DISPOSABLE) ×2
GRASPER SUT TROCAR 14GX15 (MISCELLANEOUS) ×3 IMPLANT
HEMOSTAT ARISTA ABSORB 3G PWDR (HEMOSTASIS) ×2 IMPLANT
IRRIGATION STRYKERFLOW (MISCELLANEOUS) ×1 IMPLANT
IRRIGATOR STRYKERFLOW (MISCELLANEOUS)
IV LACTATED RINGERS 1000ML (IV SOLUTION) ×6 IMPLANT
KIT PINK PAD W/HEAD ARE REST (MISCELLANEOUS) ×3
KIT PINK PAD W/HEAD ARM REST (MISCELLANEOUS) ×1 IMPLANT
KIT TURNOVER CYSTO (KITS) ×3 IMPLANT
LABEL OR SOLS (LABEL) ×3 IMPLANT
LIGASURE VESSEL 5MM BLUNT TIP (ELECTROSURGICAL) ×2 IMPLANT
MANIPULATOR VCARE LG CRV RETR (MISCELLANEOUS) ×2 IMPLANT
MANIPULATOR VCARE SML CRV RETR (MISCELLANEOUS) IMPLANT
MANIPULATOR VCARE STD CRV RETR (MISCELLANEOUS) IMPLANT
NEEDLE HYPO 22GX1.5 SAFETY (NEEDLE) ×3 IMPLANT
OCCLUDER COLPOPNEUMO (BALLOONS) ×3 IMPLANT
PACK LAP CHOLECYSTECTOMY (MISCELLANEOUS) ×3 IMPLANT
PAD OB MATERNITY 4.3X12.25 (PERSONAL CARE ITEMS) ×3 IMPLANT
PAD PREP 24X41 OB/GYN DISP (PERSONAL CARE ITEMS) ×3 IMPLANT
PORT ACCESS TROCAR AIRSEAL 12 (TROCAR) IMPLANT
PORT ACCESS TROCAR AIRSEAL 5M (TROCAR) ×2
SCISSORS METZENBAUM CVD 33 (INSTRUMENTS) ×3 IMPLANT
SET CYSTO W/LG BORE CLAMP LF (SET/KITS/TRAYS/PACK) ×3 IMPLANT
SET TRI-LUMEN FLTR TB AIRSEAL (TUBING) ×2 IMPLANT
SLEEVE ENDOPATH XCEL 5M (ENDOMECHANICALS) ×3 IMPLANT
SOL PREP PVP 2OZ (MISCELLANEOUS) ×3
SOLUTION PREP PVP 2OZ (MISCELLANEOUS) ×1 IMPLANT
SURGILUBE 2OZ TUBE FLIPTOP (MISCELLANEOUS) ×3 IMPLANT
SUT ENDO VLOC 180-0-8IN (SUTURE) ×3 IMPLANT
SUT MNCRL 4-0 (SUTURE) ×2
SUT MNCRL 4-0 27XMFL (SUTURE) ×1
SUT VIC AB 0 CT1 36 (SUTURE) ×5 IMPLANT
SUT VIC AB 2-0 CT1 (SUTURE) ×2 IMPLANT
SUTURE MNCRL 4-0 27XMF (SUTURE) ×1 IMPLANT
SYR 10ML LL (SYRINGE) ×6 IMPLANT
SYR 50ML LL SCALE MARK (SYRINGE) ×3 IMPLANT
TROCAR ENDO BLADELESS 11MM (ENDOMECHANICALS) ×1 IMPLANT
TROCAR XCEL NON-BLD 5MMX100MML (ENDOMECHANICALS) ×3 IMPLANT
TUBING INSUF HEATED (TUBING) ×3 IMPLANT

## 2018-12-10 NOTE — Anesthesia Post-op Follow-up Note (Signed)
Anesthesia QCDR form completed.        

## 2018-12-10 NOTE — Transfer of Care (Signed)
Immediate Anesthesia Transfer of Care Note  Patient: Jessica Peters  Procedure(s) Performed: HYSTERECTOMY TOTAL LAPAROSCOPIC BILATERAL SALPRINGETOMY (Bilateral ) CYSTOSCOPY (Bilateral )  Patient Location: PACU  Anesthesia Type:General  Level of Consciousness: sedated  Airway & Oxygen Therapy: Patient Spontanous Breathing and Patient connected to face mask oxygen  Post-op Assessment: Report given to RN and Post -op Vital signs reviewed and stable  Post vital signs: Reviewed and stable  Last Vitals:  Vitals Value Taken Time  BP 105/73 12/10/2018 10:28 AM  Temp 36.2 C 12/10/2018 10:28 AM  Pulse 90 12/10/2018 10:28 AM  Resp 17 12/10/2018 10:28 AM  SpO2 99 % 12/10/2018 10:28 AM    Last Pain:  Vitals:   12/10/18 1028  TempSrc:   PainSc: 0-No pain         Complications: No apparent anesthesia complications

## 2018-12-10 NOTE — Anesthesia Preprocedure Evaluation (Signed)
Anesthesia Evaluation  Patient identified by MRN, date of birth, ID band Patient awake    Reviewed: Allergy & Precautions, NPO status , Patient's Chart, lab work & pertinent test results  History of Anesthesia Complications (+) PONV and history of anesthetic complications  Airway Mallampati: II  TM Distance: >3 FB Neck ROM: Full    Dental no notable dental hx.    Pulmonary neg sleep apnea, neg COPD, Current Smoker,    breath sounds clear to auscultation- rhonchi (-) wheezing      Cardiovascular Exercise Tolerance: Good (-) hypertension(-) CAD, (-) Past MI, (-) Cardiac Stents and (-) CABG  Rhythm:Regular Rate:Normal - Systolic murmurs and - Diastolic murmurs    Neuro/Psych  Headaches, neg Seizures PSYCHIATRIC DISORDERS Anxiety Depression    GI/Hepatic negative GI ROS, Neg liver ROS,   Endo/Other  negative endocrine ROSneg diabetes  Renal/GU negative Renal ROS     Musculoskeletal negative musculoskeletal ROS (+)   Abdominal (+) - obese,   Peds  Hematology  (+) anemia ,   Anesthesia Other Findings Past Medical History: No date: Allergy No date: Anemia No date: Anxiety 02/2018: BRCA negative     Comment:  MyRisk neg No date: Endometriosis 02/2018: Family history of breast cancer     Comment:  My Risk neg No date: Headache     Comment:  h/o migraines 02/2018: Increased risk of breast cancer     Comment:  IBIS=18.8%/riskscore=26.3% No date: Ovarian cyst No date: Ovarian cyst No date: PTSD (post-traumatic stress disorder) No date: PTSD (post-traumatic stress disorder) No date: Thrombocytopenia ([place]) No date: Vitamin D deficiency   Reproductive/Obstetrics                            Anesthesia Physical Anesthesia Plan  ASA: II  Anesthesia Plan: General   Post-op Pain Management:    Induction: Intravenous  PONV Risk Score and Plan: 1 and Ondansetron, Dexamethasone, Midazolam,  Scopolamine patch - Pre-op and Propofol infusion  Airway Management Planned: Oral ETT  Additional Equipment:   Intra-op Plan:   Post-operative Plan: Extubation in OR  Informed Consent: I have reviewed the patients History and Physical, chart, labs and discussed the procedure including the risks, benefits and alternatives for the proposed anesthesia with the patient or authorized representative who has indicated his/her understanding and acceptance.     Dental advisory given  Plan Discussed with: CRNA and Anesthesiologist  Anesthesia Plan Comments:         Anesthesia Quick Evaluation

## 2018-12-10 NOTE — Interval H&P Note (Signed)
History and Physical Interval Note:  12/10/2018 7:24 AM  Jessica Peters  has presented today for surgery, with the diagnosis of CHRONIC PELVIC PAIN  The various methods of treatment have been discussed with the patient and family. After consideration of risks, benefits and other options for treatment, the patient has consented to  Procedure(s): HYSTERECTOMY TOTAL LAPAROSCOPIC BILATERAL SALPRINGETOMY (Bilateral) CYSTOSCOPY (Bilateral) as a surgical intervention .  The patient's history has been reviewed, patient examined, no change in status, stable for surgery.  I have reviewed the patient's chart and labs.  Questions were answered to the patient's satisfaction.    Prentice Docker, MD, [facility], [facility] 12/10/2018 7:24 AM

## 2018-12-10 NOTE — Anesthesia Postprocedure Evaluation (Signed)
Anesthesia Post Note  Patient: Jessica Peters  Procedure(s) Performed: HYSTERECTOMY TOTAL LAPAROSCOPIC BILATERAL SALPRINGETOMY (Bilateral ) CYSTOSCOPY (Bilateral )  Patient location during evaluation: PACU Anesthesia Type: General Level of consciousness: awake and alert and oriented Pain management: pain level controlled Vital Signs Assessment: post-procedure vital signs reviewed and stable Respiratory status: spontaneous breathing, nonlabored ventilation and respiratory function stable Cardiovascular status: blood pressure returned to baseline and stable Postop Assessment: no signs of nausea or vomiting Anesthetic complications: no     Last Vitals:  Vitals:   12/10/18 1120 12/10/18 1130  BP:  100/64  Pulse: 82 92  Resp: 14 15  Temp:  36.4 C  SpO2: 97% 97%    Last Pain:  Vitals:   12/10/18 1130  TempSrc:   PainSc: 6                  Kadien Lineman

## 2018-12-10 NOTE — Op Note (Signed)
Operative Note    Pre-Operative Diagnosis:   1) Chronic pelvic pain in female 2) Right lower quadrant abdominal pain  Post-Operative Diagnosis:  1) Chronic pelvic pain in female 2) Right lower quadrant abdominal pain  Procedures:  1. Total laparoscopic hysterectomy, bilateral salpingectomy 2. Cystoscopy  Primary Surgeon: Prentice Docker, MD   Assistant Surgeon: Adrian Prows, MD.  No other capable assistant available, in surgery requiring high level assistant.  EBL: 200 mL   IVF: 1,000 mL   Urine output: 400 mL  Specimens: uterus with cervix, bilateral fallopian tubes  Drains: none  Complications: None   Disposition: PACU   Condition: Stable   Findings:  1) retroflexed uterus with posterior 2 cm right-medial fibroid (subserosal) 2) Right fallopian tube normal in appearance apart from segment missing along the isthmus consistent with history of tubal ligation 3) Left fallopian tube normal in appearance apart from Filshie clip along isthmus, consistent with history of tubal ligation 4) prominent pelvic venous plexuses 5) on cystoscopy, normal appearing bladder without defect or injury. Efflux of urine from the bilateral ureteral orifices.    Procedure Summary:  The patient was taken to the operating room where general anesthesia was administered and found to be adequate. She was placed in the dorsal supine lithotomy position in [place] stirrups and prepped and draped in usual sterile fashion. After a timeout was called, an indwelling catheter was placed in her bladder. A sterile speculum was placed in the vagina and a single-tooth tenaculum was used to grasp the anterior lip of the cervix. A V-Care uterine manipulator was affixed to the uterus in accordance to the manufacturers recommendations. The speculum and tenaculum were removed from the vagina.  Attention was turned to the abdomen where, after injection of local anesthetic, a 5 mm infraumbilical incision was  made with the scalpel. Entry into the abdomen was obtained via Optiview trocar technique (a blunt entry technique with camera visualization through the obturator upon entry). Verification of entry into the abdomen was obtained using opening pressures. The abdomen was insufflated with CO2. The camera was introduced through the trocar with verification of atraumatic entry. A left lower quadrant 5 mm port was created via direct intra-abdominal camera visualization without difficulty. A 12 mm right lower quadrant port was placed in a similar fashion without difficulty.  After inspection of the abdomen and pelvis with the above-noted findings, the bilateral ureters were identified and found to be well away from the operative area of interest. The right fallopian tube was grasped at the fimbriated end and was transected using the LigaSure along the mesosalpinx in a lateral to medial fashion. The LigaSure then was used to transect the right round ligament and the utero-ovarian ligament was transected. Tissue was divided along the right broad ligament to the level of the interior cervical os. The bladder tissue was dissected off the lower uterine segment and cervix without difficulty and a bladder flap was created. The right uterine artery was skeletonized and identified and after ligation was transected with the LigaSure device. The same procedure was carried out on the left side. The colpotomy was performed using monopolar electrocautery in a circumferential fashion following the KOH ring.  Of note, the uterine manipulator became dislodged about halfway through the colpotomy.  The uterine manipulator was then stitched to the cervix in order to hold it place.  With this in place, the colpotomy was easily completed without difficulty.  The uterus and fallopian tubes and cervix were removed through the vagina. The vaginal  occluder balloon was placed to prevent loss of pneumoperitoneum.   Closure of the vaginal cuff was  undertaken using the V-lock stitch in a running fashion. A gloved hand was placed in the vagina to assess adequate closure of the vaginal cuff after removal of the vaginal occluder balloon. This was found to be satisfactory. All vascular pedicles were inspected and found to be hemostatic. Copious irrigation was undertaken and hemostasis was again verified. Three grams of Arista was placed along the vascular pedicles and vaginal cuff to assure continued hemostasis.   Cystoscopy was undertaken at this point. The Foley catheter was removed and the 70 cystoscope was gently introduced through the urethra. The bladder survey was undertaken with efflux of urine from both orifices noted. There were no defects noted in the bladder wall. The cystoscope was removed and the Foley catheter was replaced.  The right lower quadrant trocar was removed and the fascia was reapproximated using #0 Vicryl with a single stitch. The abdomen was then desufflated of CO2 after removal of all instruments. All trocars were removed.  The right lower quadrant skin incision was closed using 4-0 Vicryl in a subcuticular fashion. The remaining skin incisions were closed using surgical skin glue after placement of a deep vertical stitch to reduce tension on skin closure, and a layer of surgical skin glue was placed over the right lower quadrant skin incision, as well. The catheter was then removed from the bladder. The vagina was inspected and found to be free of instrumentation and sponges.   Sponge, lap, needle, and instrument counts were correct x 2.  VTE prophylaxis pneumatic compression stockings were in place throughout the entire procedure. Antibiotic prophylaxis: Ancef 2 g given within 1 hour of skin incision.The patient was then awakened and taken to the recovery room in stable condition.   Prentice Docker, MD 12/10/2018 10:22 AM

## 2018-12-10 NOTE — Anesthesia Procedure Notes (Signed)
Procedure Name: Intubation Date/Time: 12/10/2018 7:51 AM Performed by: Hedda Slade, CRNA Pre-anesthesia Checklist: Patient identified, Patient being monitored, Timeout performed, Emergency Drugs available and Suction available Patient Re-evaluated:Patient Re-evaluated prior to induction Oxygen Delivery Method: Circle system utilized Preoxygenation: Pre-oxygenation with 100% oxygen Induction Type: IV induction Ventilation: Mask ventilation without difficulty Laryngoscope Size: Mac and 3 Grade View: Grade I Tube type: Oral Tube size: 7.0 mm Number of attempts: 1 Airway Equipment and Method: Stylet Placement Confirmation: ETT inserted through vocal cords under direct vision,  positive ETCO2 and breath sounds checked- equal and bilateral Secured at: 21 cm Tube secured with: Tape Dental Injury: Teeth and Oropharynx as per pre-operative assessment

## 2018-12-11 ENCOUNTER — Encounter: Payer: Self-pay | Admitting: Obstetrics and Gynecology

## 2018-12-11 ENCOUNTER — Telehealth: Payer: Self-pay

## 2018-12-11 ENCOUNTER — Other Ambulatory Visit: Payer: Self-pay | Admitting: Obstetrics and Gynecology

## 2018-12-11 DIAGNOSIS — G8918 Other acute postprocedural pain: Secondary | ICD-10-CM

## 2018-12-11 DIAGNOSIS — G8929 Other chronic pain: Secondary | ICD-10-CM | POA: Diagnosis not present

## 2018-12-11 LAB — BASIC METABOLIC PANEL
ANION GAP: 4 — AB (ref 5–15)
BUN: 12 mg/dL (ref 6–20)
CHLORIDE: 108 mmol/L (ref 98–111)
CO2: 26 mmol/L (ref 22–32)
Calcium: 8 mg/dL — ABNORMAL LOW (ref 8.9–10.3)
Creatinine, Ser: 0.68 mg/dL (ref 0.44–1.00)
GFR calc non Af Amer: >60 mL/min (ref >60)
Glucose, Bld: 97 mg/dL (ref 70–99)
POTASSIUM: 3.9 mmol/L (ref 3.5–5.1)
Sodium: 138 mmol/L (ref 135–145)

## 2018-12-11 LAB — CBC
HCT: 31.8 % — ABNORMAL LOW (ref 36.0–46.0)
Hemoglobin: 10.2 g/dL — ABNORMAL LOW (ref 12.0–15.0)
MCH: 30.6 pg (ref 26.0–34.0)
MCHC: 32.1 g/dL (ref 30.0–36.0)
MCV: 95.5 fL (ref 80.0–100.0)
NRBC: 0 % (ref 0.0–0.2)
Platelets: 122 10*3/uL — ABNORMAL LOW (ref 150–400)
RBC: 3.33 MIL/uL — ABNORMAL LOW (ref 3.87–5.11)
RDW: 12.3 % (ref 11.5–15.5)
WBC: 7.5 10*3/uL (ref 4.0–10.5)

## 2018-12-11 MED ORDER — OXYCODONE HCL 5 MG PO TABS
5.0000 mg | ORAL_TABLET | Freq: Once | ORAL | Status: DC | PRN
  Filled 2018-12-11: qty 1

## 2018-12-11 MED ORDER — OXYCODONE HCL 5 MG PO TABS
10.0000 mg | ORAL_TABLET | Freq: Once | ORAL | Status: AC
  Administered 2018-12-11: 10 mg via ORAL
  Filled 2018-12-11: qty 2

## 2018-12-11 MED ORDER — OXYCODONE HCL 5 MG PO TABA: 5.0000 mg | ORAL_TABLET | Freq: Three times a day (TID) | ORAL | 0 refills | Status: DC | PRN

## 2018-12-11 MED ORDER — OXYCODONE HCL 5 MG PO CAPS: 5.0000 mg | ORAL_CAPSULE | Freq: Three times a day (TID) | ORAL | 0 refills | Status: DC | PRN

## 2018-12-11 MED ORDER — IBUPROFEN 600 MG PO TABS: 600.0000 mg | ORAL_TABLET | Freq: Four times a day (QID) | ORAL | 0 refills | Status: DC

## 2018-12-11 MED ORDER — OXYCODONE-ACETAMINOPHEN 5-325 MG PO TABS: 1.0000 | ORAL_TABLET | ORAL | 0 refills | Status: DC | PRN

## 2018-12-11 MED ORDER — ONDANSETRON 8 MG PO TBDP: 8.0000 mg | ORAL_TABLET | Freq: Three times a day (TID) | ORAL | 0 refills | Status: DC | PRN

## 2018-12-11 NOTE — Discharge Instructions (Signed)
Please call your doctor or return to the ER if you experience any chest pains, shortness of breath, dizziness, visual changes, fever greater than 101, any heavy bleeding (saturating more than 1 pad per hour), large clots, or foul smelling discharge, any worsening abdominal pain and cramping that is not controlled by pain medication. No tampons, enemas, douches, or sexual intercourse for 6 weeks. Also avoid tub baths, hot tubs, or swimming for 6 weeks.    Activity: do not lift over 10 lbs for 6 weeks No driving for 1-2 weeks  Pelvic rest for 6 weeks

## 2018-12-11 NOTE — Progress Notes (Signed)
Discharge order received from doctor.  Reviewed discharge instructions and prescriptions with patient and answered all questions. Follow up appointment given. Patient verbalized understanding. Patient discharged home via wheelchair by nursing/auxillary.    Melisha Eggleton Garner, RN  

## 2018-12-11 NOTE — Discharge Summary (Signed)
DC Summary Discharge Summary   Patient ID: Jessica Peters [id number] 38 y.o. 1981/01/05  Admit date: 12/10/2018  Discharge date: 12/11/2018  Principal Diagnoses:  Chronic pelvic pain female Right lower quadrant abdominal pain  Secondary Diagnoses:  none  Procedures performed during the hospitalization:  Total laparoscopic hysterectomy, bilateral salpingectomy, cystoscopy on 12/10/2018  HPI: 38 y.o.G2P2012fmale who presents for preoperative visit for pelvic pain. This area is her right lower quadrant area. The pain is described as sharp and constant. The pain does not radiate. She rates the pain as a 9/10 at worst. She rates it a 3/10 today. Alleviating factors; none. She has tried a heating pad. She has tried ibuprofen, but it doesn't really touch the pain. Aggravating factors; none. Associated symptoms: none. Denies nausea, bowel changes, blood in stool, blood in urine, urinary symptoms. She denies fevers. She thinks maybe she has some chills. She has had a workup for this on 09/08/18 that was essentially negative. The pain resolved slowly. But, has returned. She has noted nothing protruding in this area. She has had no bleeding since her ablation. She feels somewhat swollen in the area of tenderness. She had normal CT and pelvic ultrasound imaging (2 small fibroids) at that time. She states that for the last several months her pain has been cyclical and occurring for two weeks each months with regularity.   She has started taking an antidepressant medication since her last visit. Her medication is stabilizing her mood. Dr. MPark Literis prescribing this medication. She has a follow up in the morning with Dr. JWynetta Emeryfor her medication.   Past Medical History:  Diagnosis Date  . Allergy   . Anemia   . Anxiety   . BRCA negative 02/2018   MyRisk neg  . Endometriosis   . Family history of breast cancer 02/2018   My Risk neg  . Headache    h/o migraines  .  Increased risk of breast cancer 02/2018   IBIS=18.8%/riskscore=26.3%  . Ovarian cyst   . Ovarian cyst   . PTSD (post-traumatic stress disorder)   . PTSD (post-traumatic stress disorder)   . [place]ocyt[place]   . Vitamin D deficiency     Past Surgical History:  Procedure Laterality Date  . CYSTOSCOPY Bilateral 12/10/2018   Procedure: CYSTOSCOPY;  Surgeon: JWill Bonnet MD;  Location: [facility] ORS;  Service: Gynecology;  Laterality: Bilateral;  . DILITATION & CURRETTAGE/HYSTROSCOPY WITH NOVASURE ABLATION  12/07/2015   Procedure: DILATATION & CURETTAGE/HYSTEROSCOPY WITH NOVASURE ABLATION;  Surgeon: SWill Bonnet MD;  Location: [facility] ORS;  Service: Gynecology;;  . LAPAROSCOPIC HYSTERECTOMY Bilateral 12/10/2018   Procedure: HYSTERECTOMY TOTAL LAPAROSCOPIC BILATERAL SALPRINGETOMY;  Surgeon: JWill Bonnet MD;  Location: [facility] ORS;  Service: Gynecology;  Laterality: Bilateral;  . MOUTH SURGERY    . TONSILLECTOMY    . TUBAL LIGATION      No Known Allergies  Social History   Tobacco Use  . Smoking status: Current Every Day Smoker    Packs/day: 0.50    Years: 13.00    Pack years: 6.50    Types: Cigarettes  . Smokeless tobacco: Never Used  Substance Use Topics  . Alcohol use: Yes    Alcohol/week: 6.0 standard drinks    Types: 3 Glasses of wine, 3 Cans of beer per week    Comment: socially  . Drug use: No    Family History  Problem Relation Age of Onset  . Breast cancer Paternal Aunt 355 . Cancer Paternal Aunt  Breast  . Breast cancer Paternal Grandmother 37  . Stroke Paternal Grandmother   . Clotting disorder Paternal Grandmother   . Cancer Paternal Grandmother        Breast  . Depression Mother   . Depression Sister   . Alcohol abuse Maternal Grandmother   . Stroke Maternal Grandmother   . Depression Maternal Grandmother   . Cancer Maternal Grandmother        Liver  . Alcohol abuse Maternal Grandfather   . Stroke Maternal Grandfather   . Cancer  Maternal Grandfather        Liver  . Stroke Paternal Grandfather   . Heart disease Paternal [place] [facility]:  Admitted on 12/10/2018 for TLH/BS/Cysto, which occurred without difficulty. She was admitted to observation for pain control and nausea control. On POD#1 she was ambulating, tolerating PO, voiding spontaneously, and had good pain control on PO pain medications.  Her vitals were stable and her labs showed an expected drop in blood counts.  She was, therefore, deemed to be an appropriate candidate for discharge.   Discharge Exam: BP (!) 91/49 (BP Location: Right Arm) Comment: nurse notified  Pulse 71   Temp 98.2 F (36.8 C) (Oral)   Resp 16   Ht '5\' 10"'  (1.778 m)   Wt 70.8 kg   SpO2 99%   BMI 22.38 kg/m  General  no apparent distress   CV  RRR   Pulmonary  clear to ausculatation bllaterally   Abdomen  +BS, ND, appropriate ttp, incisions clean, dry, and intact  Extremities  no edema, symmetric, SCDs in place    Condition at Discharge: Stable  Complications affecting treatment: None  Discharge Medications:  Allergies as of 12/11/2018   No Known Allergies     Medication List    STOP taking these medications   BC HEADACHE POWDER PO     TAKE these medications   clonazePAM 0.5 MG tablet Commonly known as:  KLONOPIN Take 1 tablet (0.5 mg total) by mouth 2 (two) times daily as needed for anxiety.   ibuprofen 600 MG tablet Commonly known as:  ADVIL,MOTRIN Take 1 tablet (600 mg total) by mouth every 6 (six) hours. What changed:    when to take this  reasons to take this   ondansetron 8 MG disintegrating tablet Commonly known as:  ZOFRAN ODT Take 1 tablet (8 mg total) by mouth every 8 (eight) hours as needed for nausea or vomiting.   oxyCODONE-acetaminophen 5-325 MG tablet Commonly known as:  PERCOCET/ROXICET Take 1 tablet by mouth every 4 (four) hours as needed (breakthrough pain).   vortioxetine HBr 10 MG Tabs tablet Commonly known as:   TRINTELLIX Take 1 tablet (10 mg total) by mouth daily.       Follow-up arrangements:  Follow-up Information    Will Bonnet, MD. Go on 12/18/2018.   Specialty:  Obstetrics and Gynecology Why:  Keep post op follow up appointment with Dr. Clarnce Flock information: [place] [id number] [phone]          Discharge Disposition: Discharged to home to self care  Signed: Prentice Docker, MD 12/11/2018 8:04 AM

## 2018-12-11 NOTE — Telephone Encounter (Signed)
Pt had hyst yesterday c SDJ.  The nurse at [facility] gave pt 2 percocets q4hrs.  Rx is written for one.  Can rx be extended so pain can be controlled at home.  One isn't going to help her thru the next 2-3d.  [phone]

## 2018-12-14 LAB — SURGICAL PATHOLOGY

## 2018-12-18 ENCOUNTER — Encounter: Payer: Self-pay | Admitting: Obstetrics and Gynecology

## 2018-12-18 ENCOUNTER — Ambulatory Visit (INDEPENDENT_AMBULATORY_CARE_PROVIDER_SITE_OTHER): Payer: Medicaid Other | Admitting: Obstetrics and Gynecology

## 2018-12-18 VITALS — BP 122/74 | Temp 97.7°F | Ht 70.0 in | Wt 160.0 lb

## 2018-12-18 DIAGNOSIS — Z9889 Other specified postprocedural states: Secondary | ICD-10-CM

## 2018-12-18 DIAGNOSIS — Z09 Encounter for follow-up examination after completed treatment for conditions other than malignant neoplasm: Secondary | ICD-10-CM

## 2018-12-18 DIAGNOSIS — Z90710 Acquired absence of both cervix and uterus: Secondary | ICD-10-CM

## 2018-12-18 NOTE — Progress Notes (Signed)
   Postoperative Follow-up Patient presents post op from TLH/BS/Cysto 8 days ago for abnormal uterine bleeding and pelvic pain.  Subjective: Patient reports some improvement in her preop symptoms. Eating a regular diet without difficulty, though she states she had a lot of nausea and emesis early on. She has had a bowel movement. Pain is controlled with current analgesics. Medications being used: prescription NSAID's including ibuprofen.  Activity: increasing very slowly.  Objective: Vitals:   12/18/18 0904  BP: 122/74  Temp: 97.7 F (36.5 C)   Vital Signs: BP 122/74   Temp 97.7 F (36.5 C)   Ht 5\' 10"  (1.778 m)   Wt 160 lb (72.6 kg)   BMI 22.96 kg/m  Constitutional: Well nourished, well developed female in no acute distress.  HEENT: normal Skin: Warm and dry.  Extremity: no edema  Abdomen: Soft, non-tender, normal bowel sounds; no bruits, organomegaly or masses. clean, dry, intact and no erythema, induration, warmth, and tenderness  Assessment: 38 y.o. s/p TLH/BS/cysto progressing well  Plan: Patient has done well after surgery with no apparent complications.  I have discussed the post-operative course to date, and the expected progress moving forward.  The patient understands what complications to be concerned about.  I will see the patient in routine follow up, or sooner if needed.    Activity plan: increase slowly. No sexual activity for 8 weeks.   Pathology reviewed. Adenomyosis noted on final pathology.   Return in about 5 weeks (around 01/22/2019) for Six week post op visit.   Prentice Docker, MD 12/18/2018, 9:25 AM

## 2019-01-15 ENCOUNTER — Encounter: Payer: Self-pay | Admitting: Family Medicine

## 2019-01-25 ENCOUNTER — Encounter: Payer: Self-pay | Admitting: Obstetrics and Gynecology

## 2019-01-25 ENCOUNTER — Ambulatory Visit (INDEPENDENT_AMBULATORY_CARE_PROVIDER_SITE_OTHER): Payer: Medicaid Other | Admitting: Obstetrics and Gynecology

## 2019-01-25 VITALS — BP 118/74 | Ht 70.0 in | Wt 156.0 lb

## 2019-01-25 DIAGNOSIS — R102 Pelvic and perineal pain: Secondary | ICD-10-CM

## 2019-01-25 DIAGNOSIS — Z90710 Acquired absence of both cervix and uterus: Secondary | ICD-10-CM

## 2019-01-25 DIAGNOSIS — G8929 Other chronic pain: Secondary | ICD-10-CM

## 2019-01-25 NOTE — Progress Notes (Signed)
   Postoperative Follow-up Patient presents post op from TLH/BS/Cysto 6 weeks ago for pelvic pain.  Subjective: Patient reports marked improvement in her preop symptoms. Eating a regular diet without difficulty. The patient is not having any pain.  Activity: normal activities of daily living.  Objective: Vitals:   01/25/19 0850  BP: 118/74   Vital Signs: BP 118/74   Ht 5\' 10"  (1.778 m)   Wt 156 lb (70.8 kg)   BMI 22.38 kg/m  Constitutional: Well nourished, well developed female in no acute distress.  HEENT: normal Skin: Warm and dry.  Extremity: no edema  Abdomen: Soft, non-tender, normal bowel sounds; no bruits, organomegaly or masses. clean, dry, intact and no erythema, induration, warmth, and tenderness  Pelvic exam: (female chaperone present) is not limited by body habitus EGBUS: within normal limits Vagina: within normal limits and with normal mucosa blood in the vault Cervix: absent. Vaginal cuff is clean, dry, and intact, without erythema, induration, warmth, and tenderness    Assessment: 38 y.o. s/p TLH/BS/Cysto progressing well  Plan: Patient has done well after surgery with no apparent complications.  I have discussed the post-operative course to date, and the expected progress moving forward.  The patient understands what complications to be concerned about.  I will see the patient in routine follow up, or sooner if needed.    Activity plan: pelvic rest until 8 weeks postop.  Increase other activities slowly  Prentice Docker, MD 01/25/2019, 9:15 AM

## 2019-02-11 ENCOUNTER — Encounter: Payer: Self-pay | Admitting: Family Medicine

## 2019-02-15 ENCOUNTER — Other Ambulatory Visit: Payer: Self-pay | Admitting: Family Medicine

## 2019-02-15 DIAGNOSIS — F339 Major depressive disorder, recurrent, unspecified: Secondary | ICD-10-CM

## 2019-02-19 ENCOUNTER — Ambulatory Visit: Payer: Self-pay | Admitting: Pharmacist

## 2019-02-19 ENCOUNTER — Telehealth: Payer: Self-pay | Admitting: Pharmacy Technician

## 2019-02-19 DIAGNOSIS — F339 Major depressive disorder, recurrent, unspecified: Secondary | ICD-10-CM

## 2019-02-19 NOTE — Patient Instructions (Signed)
Visit Information  Goals Addressed            This Visit's Progress     Patient Stated   . "I can't afford Trintellix" (pt-stated)       Current Barriers:  . Financial Barriers - cannot afford Trintellix; patient recently lost Medicaid insurance due to income being over the limit, and notes that she cannot afford the premiums on prescription insurance available through the Marketplace . 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 As patient is uninsured and a resident of [facility], she may be eligible for help through [facility]. Explained this clinic and the application process to the patient; sent inbasket to Langley Adie, pharmacist at [facility] to outreach patient and determine eligibility . PharmD will work with PCP Dr. Wynetta Emery to send Trintellix prescription to [facility]  Patient Self Care Activities:  . Patient will collaborate with [facility] and provide necessary financial documentation to determine eligibility for Trintellix assistance  Initial goal documentation        The patient verbalized understanding of instructions provided today and declined a print copy of patient instruction materials.   Plan: - PharmD will outreach patient next week to ensure she was connected with [facility] [place], PharmD Clinical Pharmacist [place] [phone]

## 2019-02-19 NOTE — Telephone Encounter (Signed)
Attempted to contact patient.  Left message.  [place] [facility]

## 2019-02-19 NOTE — Chronic Care Management (AMB) (Signed)
Chronic Care Management   Note  02/19/2019 Name: Jessica Peters MRN: [id number] DOB: [birth year]   Subjective:  Patient is a 38 year old female seen by Park Liter, DO for primary care services, referred to chronic care management team for assistance with medication access for depression.   Review of patient status, including review of consultants reports, laboratory and other test data, was performed as part of comprehensive evaluation and provision of chronic care management services.   Objective: Lab Results  Component Value Date   CREATININE 0.68 12/11/2018   CREATININE 0.58 12/04/2018   CREATININE 0.72 09/08/2018    BP Readings from Last 3 Encounters:  01/25/19 118/74  12/18/18 122/74  12/11/18 92/60   No Known Allergies  Medications Reviewed Today    Reviewed by Will Bonnet, MD (Physician) on 01/25/19 at [phone]  Med List Status: <None>  Medication Order Taking? Sig Documenting Provider Last Dose Status Informant  clonazePAM (KLONOPIN) 0.5 MG tablet [id number] Yes Take 1 tablet (0.5 mg total) by mouth 2 (two) times daily as needed for anxiety. Park Liter P, DO Taking Active Self  ibuprofen (ADVIL,MOTRIN) 600 MG tablet [id number] Yes Take 1 tablet (600 mg total) by mouth every 6 (six) hours. Will Bonnet, MD Taking Active   ondansetron [facility] ODT) 8 MG disintegrating tablet [id number] No Take 1 tablet (8 mg total) by mouth every 8 (eight) hours as needed for nausea or vomiting.  Patient not taking:  Reported on 01/25/2019   Will Bonnet, MD Not Taking Active   OxyCODONE HCl, Abuse Deter, (OXAYDO) 5 MG TABA [id number] No Take 5 mg by mouth every 8 (eight) hours as needed (breakthrough pain).  Patient not taking:  Reported on 01/25/2019   Will Bonnet, MD Not Taking Active   oxyCODONE-acetaminophen (PERCOCET/ROXICET) 5-325 MG tablet [id number] No Take 1 tablet by mouth every 4 (four) hours as needed (breakthrough pain).  Patient not taking:  Reported on  01/25/2019   Will Bonnet, MD Not Taking Active   vortioxetine HBr (TRINTELLIX) 10 MG TABS tablet [id number] Yes Take 1 tablet (10 mg total) by mouth daily. Valerie Roys, DO Taking Active Self         Assessment:   Goals Addressed            This Visit's Progress     Patient Stated   . "I can't afford Trintellix" (pt-stated)       Current Barriers:  . Financial Barriers - cannot afford Trintellix; patient recently lost Medicaid insurance due to income being over the limit, and notes that she cannot afford the premiums on prescription insurance available through the Marketplace . 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 As patient is uninsured and a resident of [facility], she may be eligible for help through [facility]. Explained this clinic and the application process to the patient; sent inbasket to Langley Adie, pharmacist at [facility] to outreach patient and determine eligibility . PharmD will work with PCP Dr. Wynetta Emery to send Trintellix prescription to [facility]  Patient Self Care Activities:  . Patient will collaborate with [facility] and provide necessary financial documentation to determine eligibility for Trintellix assistance  Initial goal documentation  Plan: - PharmD will outreach patient next week to ensure she was connected with [facility] [place], PharmD Clinical Pharmacist [place] [phone]

## 2019-02-22 ENCOUNTER — Telehealth: Payer: Self-pay | Admitting: Pharmacy Technician

## 2019-02-22 NOTE — Telephone Encounter (Signed)
Attempted to contact patient again.  Unable to reach.  Left another message. Mailing patient new patient packet to apply to receive medication assistance from [facility].  [place] [facility]

## 2019-04-01 ENCOUNTER — Encounter: Payer: Self-pay | Admitting: Family Medicine

## 2019-04-01 NOTE — Telephone Encounter (Signed)
PA initiated via [employer]. Confirmation # [id number]

## 2019-04-07 ENCOUNTER — Telehealth: Payer: Self-pay

## 2019-04-14 ENCOUNTER — Encounter: Payer: Self-pay | Admitting: Family Medicine

## 2019-04-26 ENCOUNTER — Encounter: Payer: Medicaid Other | Admitting: Family Medicine

## 2019-05-03 ENCOUNTER — Telehealth: Payer: Self-pay | Admitting: Family Medicine

## 2019-05-03 NOTE — Telephone Encounter (Signed)
Called pt to go over script screening for Covid-19, no answer, left vm

## 2019-05-04 ENCOUNTER — Other Ambulatory Visit: Payer: Self-pay

## 2019-05-04 ENCOUNTER — Ambulatory Visit: Payer: Medicaid Other | Admitting: Family Medicine

## 2019-05-04 ENCOUNTER — Encounter: Payer: Self-pay | Admitting: Family Medicine

## 2019-05-04 VITALS — BP 109/76 | HR 79 | Temp 98.2°F | Ht 69.0 in | Wt 161.0 lb

## 2019-05-04 DIAGNOSIS — F339 Major depressive disorder, recurrent, unspecified: Secondary | ICD-10-CM

## 2019-05-04 DIAGNOSIS — Z114 Encounter for screening for human immunodeficiency virus [HIV]: Secondary | ICD-10-CM | POA: Diagnosis not present

## 2019-05-04 DIAGNOSIS — F419 Anxiety disorder, unspecified: Secondary | ICD-10-CM | POA: Diagnosis not present

## 2019-05-04 DIAGNOSIS — Z0000 Encounter for general adult medical examination without abnormal findings: Secondary | ICD-10-CM

## 2019-05-04 DIAGNOSIS — E559 Vitamin D deficiency, unspecified: Secondary | ICD-10-CM

## 2019-05-04 LAB — UA/M W/RFLX CULTURE, ROUTINE
Bilirubin, UA: NEGATIVE
Glucose, UA: NEGATIVE
Ketones, UA: NEGATIVE
Leukocytes,UA: NEGATIVE
Nitrite, UA: NEGATIVE
Protein,UA: NEGATIVE
RBC, UA: NEGATIVE
Specific Gravity, UA: 1.025 (ref 1.005–1.030)
Urobilinogen, Ur: 0.2 mg/dL (ref 0.2–1.0)
pH, UA: 5 (ref 5.0–7.5)

## 2019-05-04 MED ORDER — VORTIOXETINE HBR 10 MG PO TABS: 10.0000 mg | ORAL_TABLET | Freq: Every day | ORAL | 1 refills | Status: DC

## 2019-05-04 NOTE — Assessment & Plan Note (Signed)
Under good control on current regimen. Continue current regimen. Continue to monitor. Call with any concerns. Refills given. Continue trintellix.

## 2019-05-04 NOTE — Patient Instructions (Signed)

## 2019-05-04 NOTE — Assessment & Plan Note (Signed)
Rechecking levels today. Treat as needed. Call with any concerns.  

## 2019-05-04 NOTE — Progress Notes (Signed)
BP 109/76   Pulse 79   Temp 98.2 F (36.8 C) (Oral)   Ht '5\' 9"'  (1.753 m)   Wt 161 lb (73 kg)   SpO2 98%   BMI 23.78 kg/m    Subjective:    Patient ID: Jessica Peters, female    DOB: Oct 08, 1981, 38 y.o.   MRN: [id number]  HPI: Jessica Peters is a 38 y.o. female presenting on 05/04/2019 for comprehensive medical examination. Current medical complaints include:  ANXIETY/DEPRESSION- stopped her trintellix about 3 months ago due to issues with insurance. Was off of it for about 1.5 months.  Duration:stable Anxious mood: yes  Excessive worrying: no Irritability: no  Sweating: no Nausea: no Palpitations:no Hyperventilation: no Panic attacks: no Agoraphobia: no  Obscessions/compulsions: no Depressed mood: no Depression screen [facility] 2/9 05/04/2019 10/20/2018 09/25/2018 01/05/2018 06/13/2017  Decreased Interest 0 0 3 0 0  Down, Depressed, Hopeless 0 0 3 0 0  PHQ - 2 Score 0 0 6 0 0  Altered sleeping '2 2 3 ' - 2  Tired, decreased energy '2 1 3 ' - 0  Change in appetite 0 0 3 - 0  Feeling bad or failure about yourself  0 0 3 - 0  Trouble concentrating 0 0 0 - 0  Moving slowly or fidgety/restless 0 0 2 - 0  Suicidal thoughts 0 0 0 - 0  PHQ-9 Score '4 3 20 ' - 2  Difficult doing work/chores Not difficult at all Not difficult at all Extremely dIfficult - -   Anhedonia: no Weight changes: yes Insomnia: no   Hypersomnia: no Fatigue/loss of energy: yes Feelings of worthlessness: no Feelings of guilt: no Impaired concentration/indecisiveness: no Suicidal ideations: no  Crying spells: no Recent Stressors/Life Changes: yes   Relationship problems: no   Family stress: yes     Financial stress: yes    Job stress: yes    Recent death/loss: no  Menopausal Symptoms: hot flashes   Depression Screen done today and results listed below:  Depression screen [facility] 2/9 05/04/2019 10/20/2018 09/25/2018 01/05/2018 06/13/2017  Decreased Interest 0 0 3 0 0  Down, Depressed, Hopeless 0 0 3 0 0  PHQ - 2  Score 0 0 6 0 0  Altered sleeping '2 2 3 ' - 2  Tired, decreased energy '2 1 3 ' - 0  Change in appetite 0 0 3 - 0  Feeling bad or failure about yourself  0 0 3 - 0  Trouble concentrating 0 0 0 - 0  Moving slowly or fidgety/restless 0 0 2 - 0  Suicidal thoughts 0 0 0 - 0  PHQ-9 Score '4 3 20 ' - 2  Difficult doing work/chores Not difficult at all Not difficult at all Extremely dIfficult - -   GAD 7 : Generalized Anxiety Score 05/04/2019 10/20/2018 09/25/2018 06/13/2017  Nervous, Anxious, on Edge '2 1 3 1  ' Control/stop worrying 0 0 3 2  Worry too much - different things 0 0 3 2  Trouble relaxing 0 0 3 1  Restless 0 0 0 1  Easily annoyed or irritable 1 0 0 1  Afraid - awful might happen 0 0 2 0  Total GAD 7 Score '3 1 14 8  ' Anxiety Difficulty Not difficult at all Not difficult at all Very difficult Somewhat difficult     Past Medical History:  Past Medical History:  Diagnosis Date  . Allergy   . Anemia   . Anxiety   . BRCA negative 02/2018   MyRisk neg  .  Endometriosis   . Family history of breast cancer 02/2018   My Risk neg  . Headache    h/o migraines  . Increased risk of breast cancer 02/2018   IBIS=18.8%/riskscore=26.3%  . Ovarian cyst   . Ovarian cyst   . PTSD (post-traumatic stress disorder)   . PTSD (post-traumatic stress disorder)   . Thrombocytopenia ([place])   . Vitamin D deficiency     Surgical History:  Past Surgical History:  Procedure Laterality Date  . CYSTOSCOPY Bilateral 12/10/2018   Procedure: CYSTOSCOPY;  Surgeon: Will Bonnet, MD;  Location: [facility] ORS;  Service: Gynecology;  Laterality: Bilateral;  . DILITATION & CURRETTAGE/HYSTROSCOPY WITH NOVASURE ABLATION  12/07/2015   Procedure: DILATATION & CURETTAGE/HYSTEROSCOPY WITH NOVASURE ABLATION;  Surgeon: Will Bonnet, MD;  Location: [facility] ORS;  Service: Gynecology;;  . LAPAROSCOPIC HYSTERECTOMY Bilateral 12/10/2018   Procedure: HYSTERECTOMY TOTAL LAPAROSCOPIC BILATERAL SALPRINGETOMY;  Surgeon: Will Bonnet, MD;  Location: [facility] ORS;  Service: Gynecology;  Laterality: Bilateral;  . MOUTH SURGERY    . TONSILLECTOMY    . TUBAL LIGATION      Medications:  No current outpatient medications on file prior to visit.   No current facility-administered medications on file prior to visit.     Allergies:  No Known Allergies  Social History:  Social History   Socioeconomic History  . Marital status: Significant Other    Spouse name: Not on file  . Number of children: Not on file  . Years of education: Not on file  . Highest education level: Not on file  Occupational History  . Not on file  Social Needs  . Financial resource strain: Not on file  . Food insecurity:    Worry: Not on file    Inability: Not on file  . Transportation needs:    Medical: Not on file    Non-medical: Not on file  Tobacco Use  . Smoking status: Current Every Day Smoker    Packs/day: 0.50    Years: 13.00    Pack years: 6.50    Types: Cigarettes  . Smokeless tobacco: Never Used  Substance and Sexual Activity  . Alcohol use: Yes    Alcohol/week: 6.0 standard drinks    Types: 3 Glasses of wine, 3 Cans of beer per week    Comment: socially  . Drug use: No  . Sexual activity: Yes    Birth control/protection: Surgical  Lifestyle  . Physical activity:    Days per week: Not on file    Minutes per session: Not on file  . Stress: Not on file  Relationships  . Social connections:    Talks on phone: Not on file    Gets together: Not on file    Attends religious service: Not on file    Active member of club or organization: Not on file    Attends meetings of clubs or organizations: Not on file    Relationship status: Not on file  . Intimate partner violence:    Fear of current or ex partner: Not on file    Emotionally abused: Not on file    Physically abused: Not on file    Forced sexual activity: Not on file  Other Topics Concern  . Not on file  Social History Narrative  . Not on file    Social History   Tobacco Use  Smoking Status Current Every Day Smoker  . Packs/day: 0.50  . Years: 13.00  . Pack years: 6.50  . Types:  Cigarettes  Smokeless Tobacco Never Used   Social History   Substance and Sexual Activity  Alcohol Use Yes  . Alcohol/week: 6.0 standard drinks  . Types: 3 Glasses of wine, 3 Cans of beer per week   Comment: socially    Family History:  Family History  Problem Relation Age of Onset  . Breast cancer Paternal Aunt 7  . Cancer Paternal Aunt        Breast  . Breast cancer Paternal Grandmother 25  . Stroke Paternal Grandmother   . Clotting disorder Paternal Grandmother   . Cancer Paternal Grandmother        Breast  . Depression Mother   . Depression Sister   . Alcohol abuse Maternal Grandmother   . Stroke Maternal Grandmother   . Depression Maternal Grandmother   . Cancer Maternal Grandmother        Liver  . Alcohol abuse Maternal Grandfather   . Stroke Maternal Grandfather   . Cancer Maternal Grandfather        Liver  . Stroke Paternal Grandfather   . Heart disease Paternal Grandfather     Past medical history, surgical history, medications, allergies, family history and social history reviewed with patient today and changes made to appropriate areas of the chart.   Review of Systems  Constitutional: Positive for diaphoresis. Negative for chills, fever, malaise/fatigue and weight loss.       + weight gain   HENT: Positive for hearing loss. Negative for congestion, ear discharge, ear pain, nosebleeds, sinus pain, sore throat and tinnitus.   Eyes: Negative.   Respiratory: Negative.  Negative for stridor.   Cardiovascular: Negative.   Gastrointestinal: Negative.   Genitourinary: Negative.   Musculoskeletal: Negative.   Skin: Negative.   Neurological: Positive for tingling. Negative for dizziness, tremors, sensory change, speech change, focal weakness, seizures, loss of consciousness, weakness and headaches.  Endo/Heme/Allergies:  Negative.   Psychiatric/Behavioral: Negative.     All other ROS negative except what is listed above and in the HPI.      Objective:    BP 109/76   Pulse 79   Temp 98.2 F (36.8 C) (Oral)   Ht '5\' 9"'  (1.753 m)   Wt 161 lb (73 kg)   SpO2 98%   BMI 23.78 kg/m   Wt Readings from Last 3 Encounters:  05/04/19 161 lb (73 kg)  01/25/19 156 lb (70.8 kg)  12/18/18 160 lb (72.6 kg)    Physical Exam Vitals signs and nursing note reviewed.  Constitutional:      General: She is not in acute distress.    Appearance: Normal appearance. She is not ill-appearing, toxic-appearing or diaphoretic.  HENT:     Head: Normocephalic and atraumatic.     Right Ear: Tympanic membrane, ear canal and external ear normal. There is no impacted cerumen.     Left Ear: Tympanic membrane, ear canal and external ear normal. There is no impacted cerumen.     Nose: Nose normal. No congestion or rhinorrhea.     Mouth/Throat:     Mouth: Mucous membranes are moist.     Pharynx: Oropharynx is clear. No oropharyngeal exudate or posterior oropharyngeal erythema.  Eyes:     General: No scleral icterus.       Right eye: No discharge.        Left eye: No discharge.     Extraocular Movements: Extraocular movements intact.     Conjunctiva/sclera: Conjunctivae normal.     Pupils: Pupils are equal,  round, and reactive to light.  Neck:     Musculoskeletal: Normal range of motion and neck supple. No neck rigidity or muscular tenderness.     Vascular: No carotid bruit.  Cardiovascular:     Rate and Rhythm: Normal rate and regular rhythm.     Pulses: Normal pulses.     Heart sounds: No murmur. No friction rub. No gallop.   Pulmonary:     Effort: Pulmonary effort is normal. No respiratory distress.     Breath sounds: Normal breath sounds. No stridor. No wheezing, rhonchi or rales.  Chest:     Chest wall: No tenderness.  Abdominal:     General: Abdomen is flat. Bowel sounds are normal. There is no distension.      Palpations: Abdomen is soft. There is no mass.     Tenderness: There is no abdominal tenderness. There is no right CVA tenderness, left CVA tenderness, guarding or rebound.     Hernia: No hernia is present.  Genitourinary:    Comments: Breast and pelvic exams deferred with shared decision making Musculoskeletal:        General: No swelling, tenderness, deformity or signs of injury.     Right lower leg: No edema.     Left lower leg: No edema.  Lymphadenopathy:     Cervical: No cervical adenopathy.  Skin:    General: Skin is warm and dry.     Capillary Refill: Capillary refill takes less than 2 seconds.     Coloration: Skin is not jaundiced or pale.     Findings: No bruising, erythema, lesion or rash.  Neurological:     General: No focal deficit present.     Mental Status: She is alert and oriented to person, place, and time. Mental status is at baseline.     Cranial Nerves: No cranial nerve deficit.     Sensory: No sensory deficit.     Motor: No weakness.     Coordination: Coordination normal.     Gait: Gait normal.     Deep Tendon Reflexes: Reflexes normal.  Psychiatric:        Mood and Affect: Mood normal.        Behavior: Behavior normal.        Thought Content: Thought content normal.        Judgment: Judgment normal.     Results for orders placed or performed during the hospital encounter of 12/10/18  CBC  Result Value Ref Range   WBC 7.5 4.0 - 10.5 K/uL   RBC 3.33 (L) 3.87 - 5.11 MIL/uL   Hemoglobin 10.2 (L) 12.0 - 15.0 g/dL   HCT 31.8 (L) 36.0 - 46.0 %   MCV 95.5 80.0 - 100.0 fL   MCH 30.6 26.0 - 34.0 pg   MCHC 32.1 30.0 - 36.0 g/dL   RDW 12.3 11.5 - 15.5 %   Platelets 122 (L) 150 - 400 K/uL   nRBC 0.0 0.0 - 0.2 %  Basic metabolic panel  Result Value Ref Range   Sodium 138 135 - 145 mmol/L   Potassium 3.9 3.5 - 5.1 mmol/L   Chloride 108 98 - 111 mmol/L   CO2 26 22 - 32 mmol/L   Glucose, Bld 97 70 - 99 mg/dL   BUN 12 6 - 20 mg/dL   Creatinine, Ser 0.68 0.44  - 1.00 mg/dL   Calcium 8.0 (L) 8.9 - 10.3 mg/dL   GFR calc non Af Amer >60 >60 mL/min   GFR calc  Af Amer >60 >60 mL/min   Anion gap 4 (L) 5 - 15  Pregnancy, urine POC  Result Value Ref Range   Preg Test, Ur NEGATIVE NEGATIVE  Surgical pathology  Result Value Ref Range   SURGICAL PATHOLOGY      Surgical Pathology CASE: [id number] PATIENT: [facility] Surgical Pathology Report     SPECIMEN SUBMITTED: A. Uterus with cervix, bilateral tubes  CLINICAL HISTORY: None provided  PRE-OPERATIVE DIAGNOSIS: Chronic pelvic pain  POST-OPERATIVE DIAGNOSIS: Same as pre op     DIAGNOSIS: A. UTERUS WITH CERVIX; HYSTERECTOMY: - CERVIX WITH TUBAL METAPLASIA; NEGATIVE FOR INTRAEPITHELIAL LESION AND MALIGNANCY (ENTIRELY SUBMITTED). - ENDOMETRIAL CHANGES CONSISTENT WITH PREVIOUS ABLATION. - RESIDUAL INACTIVE ENDOMETRIUM WITH PROMINENT CILIATED CELLS. - ADENOMYOSIS. 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  FALLOPIAN TUBES, BILATERAL; SALPINGECTOMY: - NO PATHOLOGIC CHANGES.   GROSS DESCRIPTION: A. Labeled: Uterus with cervix, bilateral tubes Received: Formalin Weight: 152 grams (total) Dimensions:      Uterus - 7.2 x 6.7 x 5.5 cm      Cervix - 3.5 x 3.2 x 3.0 cm Serosa: The specimen is received previously opened.  The intact serosa displays a 0.5 x  0.2 cm focal area of wispy adhesions and is otherwise tan-pink and smooth. Cervix: The ectocervical mucosa is pale tan-pink and smooth with multifocal areas of minute hemorrhage adjacent to the external os.  The external os is slit-like, patent, and 1.5 cm in diameter. Endocervix: 2.5 cm in length by 1.0 cm in diameter.  The endocervical canal is slightly disrupted due to being previously opened.  The intact endocervical mucosa is tan, striated, and grossly unremarkable. Endometrial cavity:      Dimensions - 3.7 cm in length by 0.7 cm cornu to cornu width      Thickness - 0.1 cm      Other findings - The endometrial cavity  is slightly disrupted due to being previously opened.  At the time of grossing, the endometrial cavity is pale tan, puckered, and fibrotic (suspicious for previous ablation procedure).  Minimal possible remaining endometrium is identified of which is tan-pink and smooth. Myometrium:     Thickness - 2.5 cm (average)     Other findings - Sectioning di splays 1 subserosal nodule measuring 1.7 cm in greatest dimension.  The nodule displays a pale-tan, whorled cut surface.  The remaining myometrium is tan-pink and otherwise grossly unremarkable.  Fallopian tubes, bilateral, received detached and freely floating in specimen container: Fallopian tube 1: Measurements - 5.5 cm in length x 0.5 cm in diameter Description: Fimbriated, with tan-purple smooth serosa. There is a tubal ligation clip present. Sectioning displays a pinpoint, grossly unremarkable lumen. Fallopian tube 2: Measurements - 4.4 cm in length x 0.5 cm in diameter Description: Fimbriated, with tan-pink smooth serosa. Sectioning displays a pinpoint, grossly unremarkable lumen.  Other comments: The anterior cervical soft tissue resection margin is inked blue, and the posterior cervical soft tissue resection margin is inked black.  Block summary: 1-3 - 12 to 3 o'clock cervix 4-7 - 3 to 6:00 cervix 8-11 - 6 to 9:00 cervix 12-14 - 9 to 12:00 cervix 15 -  serosal adhesions 16-17 - puckered and fibrotic anterior endometrial cavity with possible residual endometrium 18-19 - puckered and fibrotic posterior endometrial cavity with possible residual endometrium 20-21 - myometrial nodule 22 - fallopian tube with clip, longitudinally sectioned fimbria and cross-sections 23 - opposite fallopian tube, longitudinally sectioned fimbria and cross-sections    Final Diagnosis performed by Bryan Lemma, MD.  Electronically signed 12/14/2018 4:22:04PM The electronic signature indicates that the named Attending Pathologist has  evaluated the specimen  Technical component performed at [place], [address] [place], [place] [address] Lab: [phone] Dir: Rush Farmer, MD, MMM  Professional component performed at [facility], [facility], [place], [place], [place] [address] Lab: [phone] Dir: Dellia Nims. Reuel Derby, MD       Assessment & Plan:   Problem List Items Addressed This Visit      Other   Vitamin D deficiency    Rechecking levels today. Treat as needed. Call with any concerns.       Relevant Orders   VITAMIN D 25 Hydroxy (Vit-D Deficiency, Fractures)   Anxiety    Under good control on current regimen. Continue current regimen. Continue to monitor. Call with any concerns. Refills given. Continue trintellix.      Relevant Medications   vortioxetine HBr (TRINTELLIX) 10 MG TABS tablet   Depression, recurrent (HCC)    Under good control on current regimen. Continue current regimen. Continue to monitor. Call with any concerns. Refills given. Continue trintellix.       Relevant Medications   vortioxetine HBr (TRINTELLIX) 10 MG TABS tablet    Other Visit Diagnoses    Routine general medical examination at a health care facility    -  Primary   Vaccines up to date. Screening labs checked today. Pap N/A. Continue diet and exercise. Call with any concerns.    Relevant Orders   CBC with Differential/Platelet   Comprehensive metabolic panel   Lipid Panel w/o Chol/HDL Ratio   TSH   UA/M w/rflx Culture, Routine   Encounter for screening for HIV       Labs drawn today. Await results.    Relevant Orders   HIV Antibody (routine testing w rflx)       Follow up plan: Return in about 6 months (around 11/03/2019) for follow up mood.   LABORATORY TESTING:  - Pap smear: done elsewhere  IMMUNIZATIONS:   - Tdap: Tetanus vaccination status reviewed: last tetanus booster within 10 years. - Influenza: Refused - Pneumovax: Refused   PATIENT COUNSELING:   Advised to take 1 mg of  folate supplement per day if capable of pregnancy.   Sexuality: Discussed sexually transmitted diseases, partner selection, use of condoms, avoidance of unintended pregnancy  and contraceptive alternatives.   Advised to avoid cigarette smoking.  I discussed with the patient that most people either abstain from alcohol or drink within safe limits (<=14/week and <=4 drinks/occasion for males, <=7/weeks and <= 3 drinks/occasion for females) and that the risk for alcohol disorders and other health effects rises proportionally with the number of drinks per week and how often a drinker exceeds daily limits.  Discussed cessation/primary prevention of drug use and availability of treatment for abuse.   Diet: Encouraged to adjust caloric intake to maintain  or achieve ideal body weight, to reduce intake of dietary saturated fat and total fat, to limit sodium intake by avoiding high sodium foods and not adding table salt, and to maintain adequate dietary potassium and calcium preferably from fresh fruits, vegetables, and low-fat dairy products.    stressed the importance of regular exercise  Injury prevention: Discussed safety belts, safety helmets, smoke detector, smoking near bedding or upholstery.   Dental health: Discussed importance of regular tooth brushing, flossing, and dental visits.    NEXT PREVENTATIVE PHYSICAL DUE IN 1 YEAR. Return in about 6 months (around 11/03/2019) for follow up mood.

## 2019-05-05 ENCOUNTER — Encounter: Payer: Self-pay | Admitting: Family Medicine

## 2019-05-05 LAB — LIPID PANEL W/O CHOL/HDL RATIO
Cholesterol, Total: 135 mg/dL (ref 100–199)
HDL: 70 mg/dL (ref >39)
LDL Calculated: 57 mg/dL (ref 0–99)
Triglycerides: 42 mg/dL (ref 0–149)
VLDL Cholesterol Cal: 8 mg/dL (ref 5–40)

## 2019-05-05 LAB — CBC WITH DIFFERENTIAL/PLATELET
Basophils Absolute: 0 10*3/uL (ref 0.0–0.2)
Basos: 0 %
EOS (ABSOLUTE): 0.2 10*3/uL (ref 0.0–0.4)
Eos: 4 %
Hematocrit: 43.5 % (ref 34.0–46.6)
Hemoglobin: 13.6 g/dL (ref 11.1–15.9)
Immature Grans (Abs): 0 10*3/uL (ref 0.0–0.1)
Immature Granulocytes: 1 %
Lymphocytes Absolute: 1.7 10*3/uL (ref 0.7–3.1)
Lymphs: 31 %
MCH: 30.1 pg (ref 26.6–33.0)
MCHC: 31.3 g/dL — ABNORMAL LOW (ref 31.5–35.7)
MCV: 96 fL (ref 79–97)
Monocytes Absolute: 0.4 10*3/uL (ref 0.1–0.9)
Monocytes: 8 %
Neutrophils Absolute: 3 10*3/uL (ref 1.4–7.0)
Neutrophils: 56 %
Platelets: 161 10*3/uL (ref 150–450)
RBC: 4.52 x10E6/uL (ref 3.77–5.28)
RDW: 12.3 % (ref 11.7–15.4)
WBC: 5.4 10*3/uL (ref 3.4–10.8)

## 2019-05-05 LAB — COMPREHENSIVE METABOLIC PANEL
ALT: 15 IU/L (ref 0–32)
AST: 21 IU/L (ref 0–40)
Albumin/Globulin Ratio: 2.2 (ref 1.2–2.2)
Albumin: 4.3 g/dL (ref 3.8–4.8)
Alkaline Phosphatase: 31 IU/L — ABNORMAL LOW (ref 39–117)
BUN/Creatinine Ratio: 21 (ref 9–23)
BUN: 15 mg/dL (ref 6–20)
Bilirubin Total: 0.5 mg/dL (ref 0.0–1.2)
CO2: 22 mmol/L (ref 20–29)
Calcium: 8.9 mg/dL (ref 8.7–10.2)
Chloride: 106 mmol/L (ref 96–106)
Creatinine, Ser: 0.73 mg/dL (ref 0.57–1.00)
GFR calc Af Amer: 123 mL/min/{1.73_m2} (ref >59)
GFR calc non Af Amer: 106 mL/min/{1.73_m2} (ref >59)
Globulin, Total: 2 g/dL (ref 1.5–4.5)
Glucose: 107 mg/dL — ABNORMAL HIGH (ref 65–99)
Potassium: 4.2 mmol/L (ref 3.5–5.2)
Sodium: 143 mmol/L (ref 134–144)
Total Protein: 6.3 g/dL (ref 6.0–8.5)

## 2019-05-05 LAB — HIV ANTIBODY (ROUTINE TESTING W REFLEX): HIV Screen 4th Generation wRfx: NONREACTIVE

## 2019-05-05 LAB — VITAMIN D 25 HYDROXY (VIT D DEFICIENCY, FRACTURES): Vit D, 25-Hydroxy: 31 ng/mL (ref 30.0–100.0)

## 2019-05-05 LAB — TSH: TSH: 0.981 u[IU]/mL (ref 0.450–4.500)

## 2019-05-25 DIAGNOSIS — J3489 Other specified disorders of nose and nasal sinuses: Secondary | ICD-10-CM | POA: Diagnosis not present

## 2019-05-25 DIAGNOSIS — Z20828 Contact with and (suspected) exposure to other viral communicable diseases: Secondary | ICD-10-CM | POA: Diagnosis not present

## 2019-06-18 ENCOUNTER — Encounter: Payer: Self-pay | Admitting: Family Medicine

## 2019-07-04 ENCOUNTER — Other Ambulatory Visit: Payer: Self-pay | Admitting: Obstetrics and Gynecology

## 2019-07-04 DIAGNOSIS — R232 Flushing: Secondary | ICD-10-CM

## 2019-07-06 ENCOUNTER — Other Ambulatory Visit: Payer: Self-pay

## 2019-07-06 ENCOUNTER — Other Ambulatory Visit: Payer: Medicaid Other

## 2019-07-06 DIAGNOSIS — R232 Flushing: Secondary | ICD-10-CM | POA: Diagnosis not present

## 2019-07-07 LAB — FOLLICLE STIMULATING HORMONE: FSH: 13.3 m[IU]/mL

## 2019-07-07 LAB — ESTRADIOL: Estradiol: 259 pg/mL

## 2019-11-08 ENCOUNTER — Ambulatory Visit: Payer: Medicaid Other | Admitting: Family Medicine

## 2019-12-08 ENCOUNTER — Encounter: Payer: Self-pay | Admitting: Family Medicine

## 2019-12-09 ENCOUNTER — Telehealth: Payer: Self-pay | Admitting: Family Medicine

## 2019-12-10 NOTE — Telephone Encounter (Signed)
error 

## 2020-02-02 ENCOUNTER — Ambulatory Visit: Payer: Self-pay | Admitting: Pharmacist

## 2020-02-02 NOTE — Chronic Care Management (AMB) (Signed)
  Chronic Care Management   Note  02/02/2020 Name: Jessica Peters MRN: [id number] DOB: 1981-10-26  [name] is a 39 y.o. year old female who is a primary care patient of Valerie Roys, DO. The CCM team was consulted for assistance with chronic disease management and care coordination needs.    Goals Addressed            This Visit's Progress     Patient Stated   . COMPLETED: "I can't afford Trintellix" (pt-stated)       Current Barriers:  . Financial Barriers - previously could not afford Trintellix d/t losing Medicaid  Pharmacist Clinical Goal(s):  Marland Kitchen Over the next 30 days, patient will work with PharmD and [facility] to address needs related to medication access  Interventions: Marland Kitchen Medicaid re-initiated. Medication access barriers removed.   Patient Self Care Activities:  . Patient will collaborate with provider for future needs.   Please see past updates related to this goal by clicking on the "Past Updates" button in the selected goal         [place] case  Catie Darnelle Maffucci, PharmD, [place] [phone]

## 2020-05-18 ENCOUNTER — Other Ambulatory Visit: Payer: Self-pay

## 2020-05-18 ENCOUNTER — Ambulatory Visit (INDEPENDENT_AMBULATORY_CARE_PROVIDER_SITE_OTHER): Payer: Medicaid Other | Admitting: Family Medicine

## 2020-05-18 ENCOUNTER — Encounter: Payer: Self-pay | Admitting: Family Medicine

## 2020-05-18 VITALS — BP 109/72 | HR 80 | Temp 98.2°F | Wt 162.0 lb

## 2020-05-18 DIAGNOSIS — Z598 Other problems related to housing and economic circumstances: Secondary | ICD-10-CM | POA: Diagnosis not present

## 2020-05-18 DIAGNOSIS — F339 Major depressive disorder, recurrent, unspecified: Secondary | ICD-10-CM

## 2020-05-18 DIAGNOSIS — F419 Anxiety disorder, unspecified: Secondary | ICD-10-CM

## 2020-05-18 DIAGNOSIS — Z599 Problem related to housing and economic circumstances, unspecified: Secondary | ICD-10-CM

## 2020-05-18 MED ORDER — ALPRAZOLAM 0.25 MG PO TABS: 0.2500 mg | ORAL_TABLET | Freq: Every day | ORAL | 0 refills | Status: DC | PRN

## 2020-05-18 MED ORDER — SERTRALINE HCL 50 MG PO TABS: 50.0000 mg | ORAL_TABLET | Freq: Every day | ORAL | 0 refills | Status: DC
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
## 2020-05-19 NOTE — Assessment & Plan Note (Signed)
Trial zoloft, referral to SW placed for initial counseling and resources

## 2020-05-19 NOTE — Assessment & Plan Note (Signed)
Trial zoloft, low dose xanax rarely prn, and SW referral placed for counseling, resource discussion

## 2020-05-22 ENCOUNTER — Other Ambulatory Visit: Payer: Self-pay

## 2020-05-22 ENCOUNTER — Emergency Department: Payer: Medicaid Other

## 2020-05-22 ENCOUNTER — Encounter: Payer: Self-pay | Admitting: Intensive Care

## 2020-05-22 ENCOUNTER — Inpatient Hospital Stay
Admission: EM | Admit: 2020-05-22 | Discharge: 2020-05-24 | DRG: 872 | Disposition: A | Discharge status: Home / Self Care | Payer: Medicaid Other | Attending: Internal Medicine | Admitting: Internal Medicine

## 2020-05-22 DIAGNOSIS — F4310 Post-traumatic stress disorder, unspecified: Secondary | ICD-10-CM | POA: Diagnosis present

## 2020-05-22 DIAGNOSIS — F339 Major depressive disorder, recurrent, unspecified: Secondary | ICD-10-CM | POA: Diagnosis present

## 2020-05-22 DIAGNOSIS — Z20822 Contact with and (suspected) exposure to covid-19: Secondary | ICD-10-CM | POA: Diagnosis not present

## 2020-05-22 DIAGNOSIS — Z79899 Other long term (current) drug therapy: Secondary | ICD-10-CM

## 2020-05-22 DIAGNOSIS — Z90710 Acquired absence of both cervix and uterus: Secondary | ICD-10-CM | POA: Diagnosis not present

## 2020-05-22 DIAGNOSIS — J309 Allergic rhinitis, unspecified: Secondary | ICD-10-CM | POA: Diagnosis present

## 2020-05-22 DIAGNOSIS — N10 Acute tubulo-interstitial nephritis: Secondary | ICD-10-CM

## 2020-05-22 DIAGNOSIS — Z7289 Other problems related to lifestyle: Secondary | ICD-10-CM

## 2020-05-22 DIAGNOSIS — N3289 Other specified disorders of bladder: Secondary | ICD-10-CM | POA: Diagnosis not present

## 2020-05-22 DIAGNOSIS — F419 Anxiety disorder, unspecified: Secondary | ICD-10-CM | POA: Diagnosis present

## 2020-05-22 DIAGNOSIS — D696 Thrombocytopenia, unspecified: Secondary | ICD-10-CM | POA: Diagnosis present

## 2020-05-22 DIAGNOSIS — N12 Tubulo-interstitial nephritis, not specified as acute or chronic: Secondary | ICD-10-CM

## 2020-05-22 DIAGNOSIS — Z803 Family history of malignant neoplasm of breast: Secondary | ICD-10-CM

## 2020-05-22 DIAGNOSIS — B9620 Unspecified Escherichia coli [E. coli] as the cause of diseases classified elsewhere: Secondary | ICD-10-CM | POA: Diagnosis present

## 2020-05-22 DIAGNOSIS — E876 Hypokalemia: Secondary | ICD-10-CM | POA: Diagnosis present

## 2020-05-22 DIAGNOSIS — R102 Pelvic and perineal pain: Secondary | ICD-10-CM | POA: Diagnosis not present

## 2020-05-22 DIAGNOSIS — Z9079 Acquired absence of other genital organ(s): Secondary | ICD-10-CM

## 2020-05-22 DIAGNOSIS — R161 Splenomegaly, not elsewhere classified: Secondary | ICD-10-CM | POA: Diagnosis not present

## 2020-05-22 DIAGNOSIS — R109 Unspecified abdominal pain: Secondary | ICD-10-CM | POA: Diagnosis not present

## 2020-05-22 DIAGNOSIS — F17210 Nicotine dependence, cigarettes, uncomplicated: Secondary | ICD-10-CM | POA: Diagnosis present

## 2020-05-22 DIAGNOSIS — R5383 Other fatigue: Secondary | ICD-10-CM | POA: Diagnosis not present

## 2020-05-22 DIAGNOSIS — A419 Sepsis, unspecified organism: Principal | ICD-10-CM | POA: Diagnosis present

## 2020-05-22 LAB — COMPREHENSIVE METABOLIC PANEL
ALT: 16 U/L (ref 0–44)
AST: 21 U/L (ref 15–41)
Albumin: 4.5 g/dL (ref 3.5–5.0)
Alkaline Phosphatase: 22 U/L — ABNORMAL LOW (ref 38–126)
Anion gap: 12 (ref 5–15)
BUN: 12 mg/dL (ref 6–20)
CO2: 23 mmol/L (ref 22–32)
Calcium: 8.9 mg/dL (ref 8.9–10.3)
Chloride: 100 mmol/L (ref 98–111)
Creatinine, Ser: 0.87 mg/dL (ref 0.44–1.00)
GFR calc Af Amer: >60 mL/min (ref >60)
GFR calc non Af Amer: >60 mL/min (ref >60)
Glucose, Bld: 99 mg/dL (ref 70–99)
Potassium: 3.7 mmol/L (ref 3.5–5.1)
Sodium: 135 mmol/L (ref 135–145)
Total Bilirubin: 1.1 mg/dL (ref 0.3–1.2)
Total Protein: 7.4 g/dL (ref 6.5–8.1)

## 2020-05-22 LAB — URINALYSIS, COMPLETE (UACMP) WITH MICROSCOPIC
Bilirubin Urine: NEGATIVE
Glucose, UA: NEGATIVE mg/dL
Ketones, ur: 20 mg/dL — AB
Nitrite: POSITIVE — AB
Protein, ur: 100 mg/dL — AB
Specific Gravity, Urine: 1.012 (ref 1.005–1.030)
WBC, UA: >50 WBC/hpf — ABNORMAL HIGH (ref 0–5)
pH: 6 (ref 5.0–8.0)

## 2020-05-22 LAB — CBC
HCT: 41.7 % (ref 36.0–46.0)
Hemoglobin: 13.9 g/dL (ref 12.0–15.0)
MCH: 30.8 pg (ref 26.0–34.0)
MCHC: 33.3 g/dL (ref 30.0–36.0)
MCV: 92.5 fL (ref 80.0–100.0)
Platelets: 72 10*3/uL — ABNORMAL LOW (ref 150–400)
RBC: 4.51 MIL/uL (ref 3.87–5.11)
RDW: 12 % (ref 11.5–15.5)
WBC: 9.2 10*3/uL (ref 4.0–10.5)
nRBC: 0 % (ref 0.0–0.2)

## 2020-05-22 LAB — LIPASE, BLOOD: Lipase: 20 U/L (ref 11–51)

## 2020-05-22 LAB — POCT PREGNANCY, URINE: Preg Test, Ur: NEGATIVE

## 2020-05-22 LAB — SARS CORONAVIRUS 2 BY RT PCR (HOSPITAL ORDER, PERFORMED IN ~~LOC~~ HOSPITAL LAB): SARS Coronavirus 2: NEGATIVE

## 2020-05-22 IMAGING — CT CT RENAL STONE PROTOCOL
3 of 4 series · 8 of 46 positions shown, 15 images · non-contrast
Comparison: CT [DATE]

CLINICAL DATA: Right-sided pain

EXAM:
CT ABDOMEN AND PELVIS WITHOUT CONTRAST
TECHNIQUE: Multidetector CT imaging of the abdomen and pelvis was performed
following the standard protocol without IV contrast.

[Series 4: lung bases · axial · 0.58mm/px · z∈[-549,-489]mm · 4 of 22 slices shown, 9 images]
[im 5/22  soft-tissue]
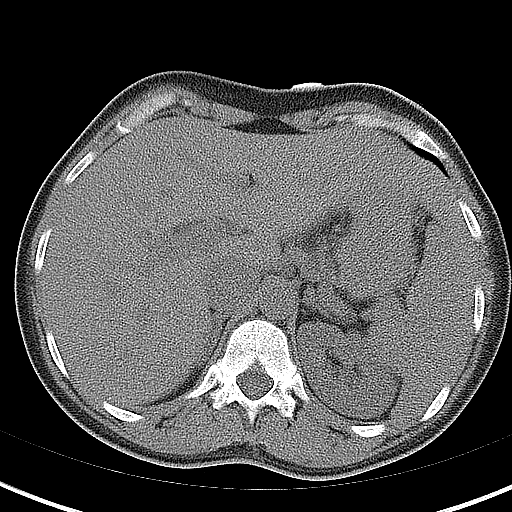
[im 5/22  lung]
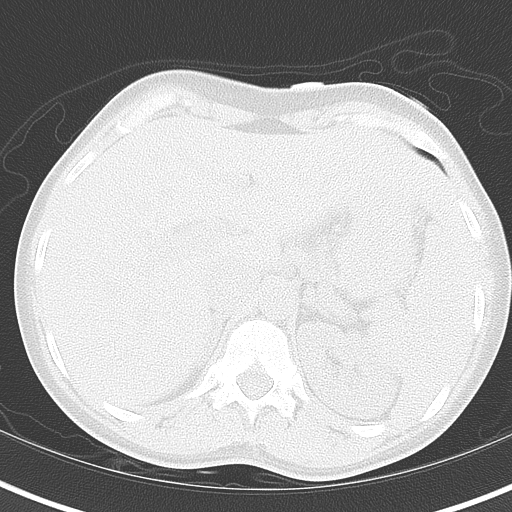
[im 5/22  bone]
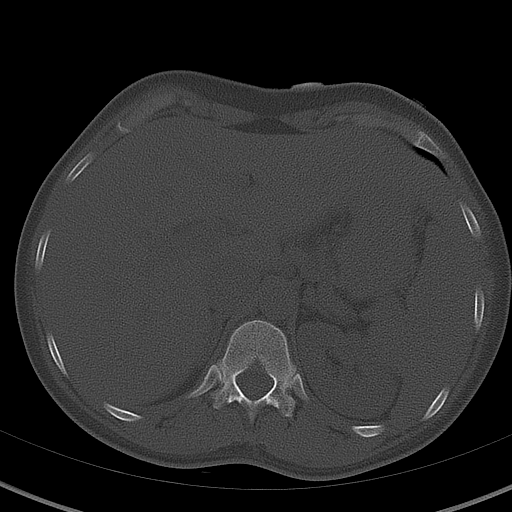
[im 9/22  soft-tissue]
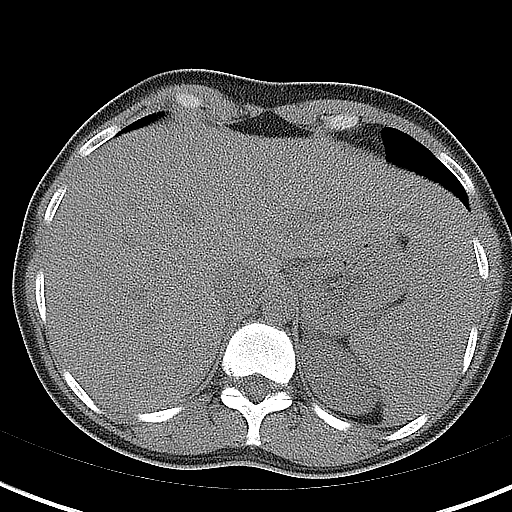
[im 9/22  lung]
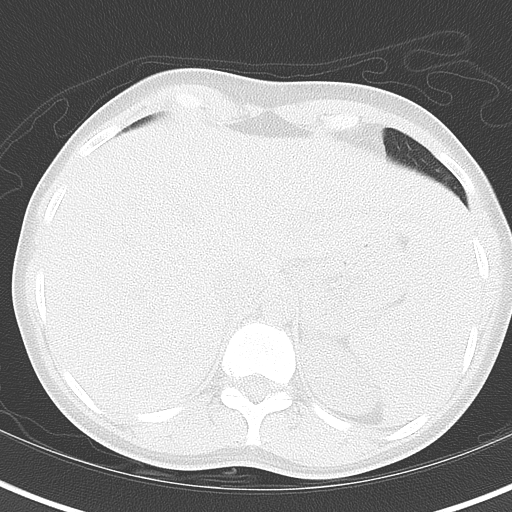
[im 13/22  soft-tissue]
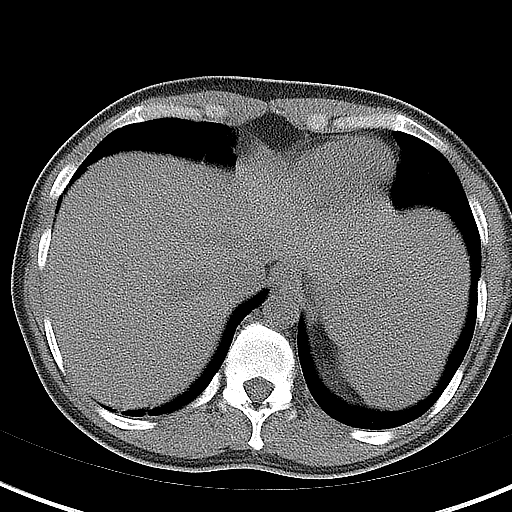
[im 13/22  lung]
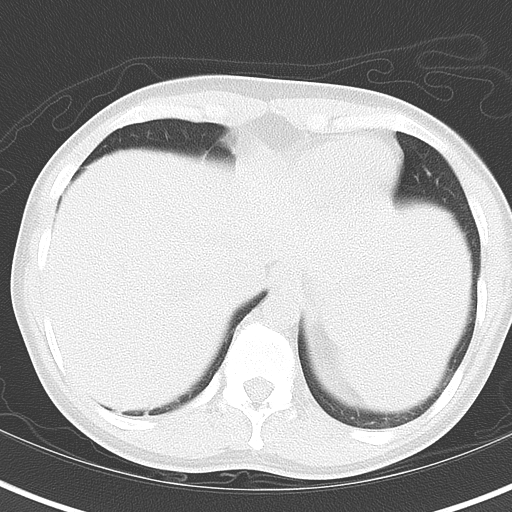
[im 17/22  soft-tissue]
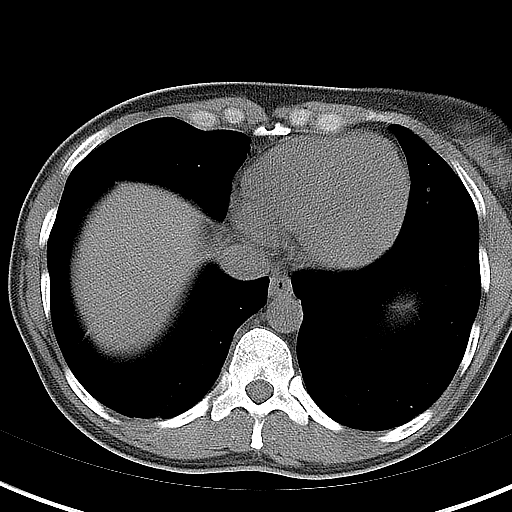
[im 17/22  lung]
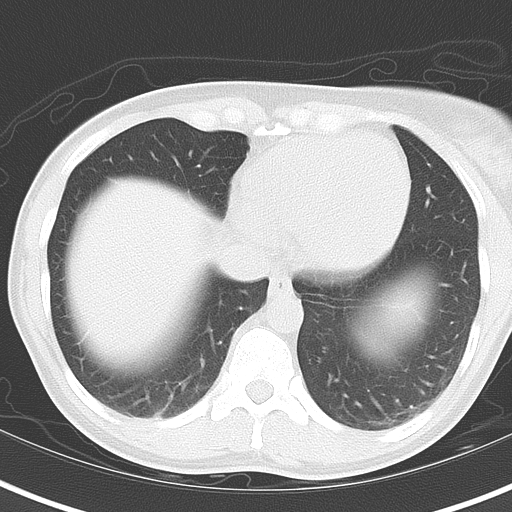

[Series 5: coronal · coronal · 0.68mm/px · 3 of 118 slices shown, 4 images]
[im 40/118  soft-tissue]
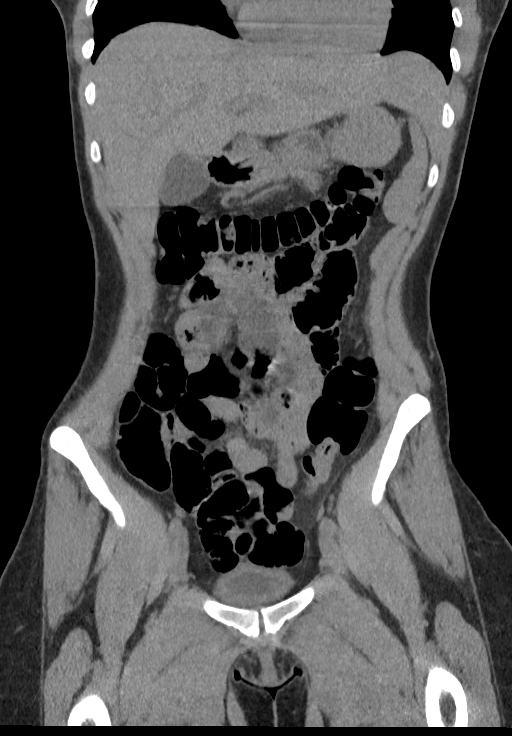
[im 53/118  soft-tissue]
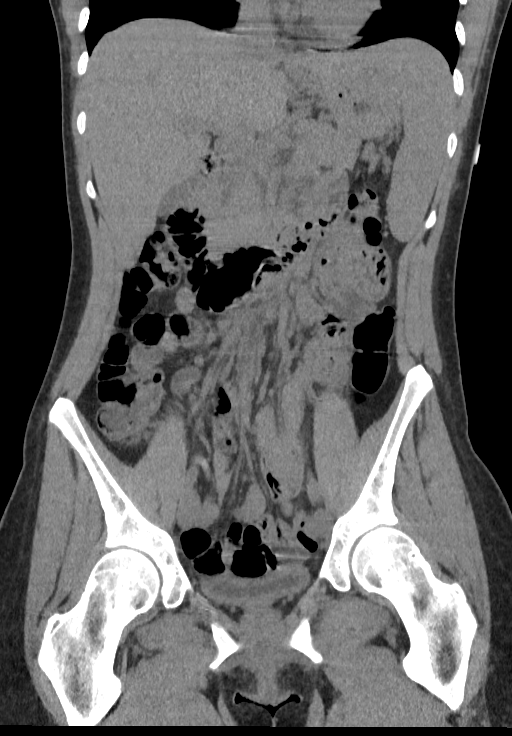
[im 53/118  bone]
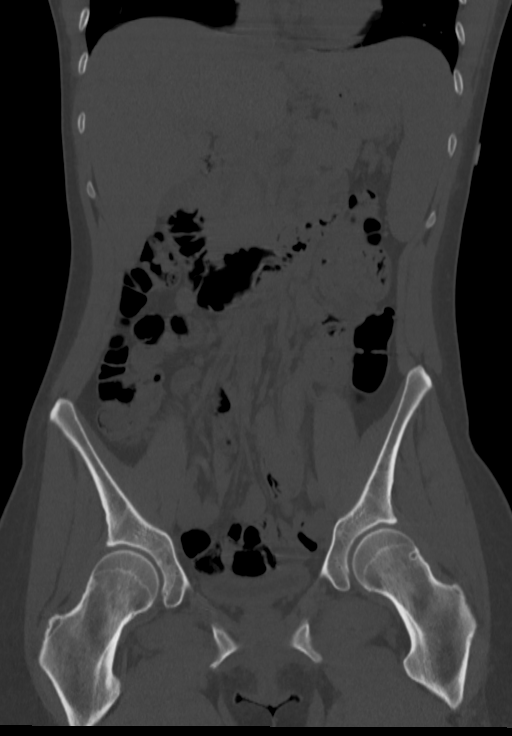
[im 66/118  soft-tissue]
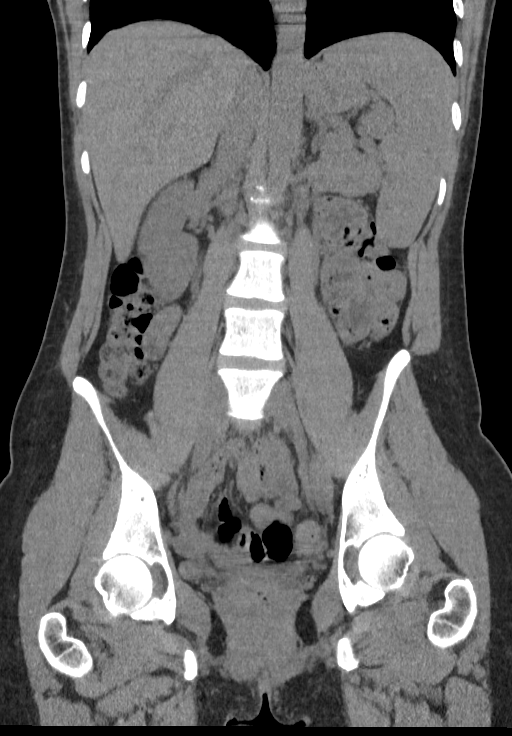

[Series 6: sagittal · sagittal · 0.53mm/px · 1 of 166 slices shown, 2 images]
[im 56/166  soft-tissue]
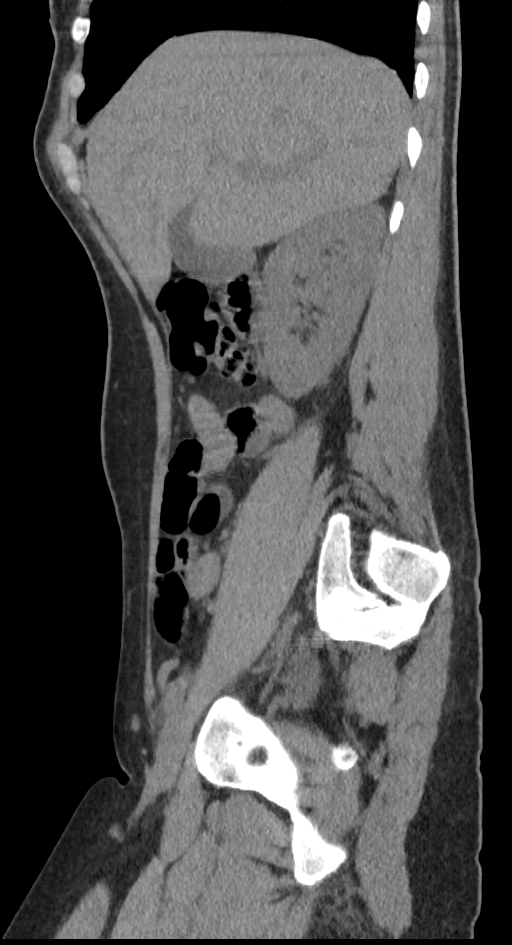
[im 56/166  bone]
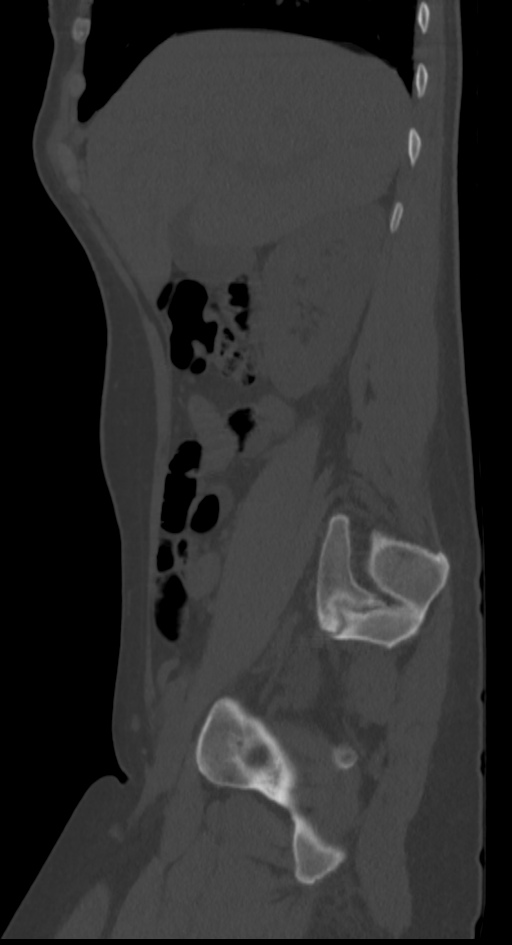

[8 of 46 positions shown; findings below may reference images not displayed]

FINDINGS: Lower chest: No acute abnormality.

Hepatobiliary: No focal liver abnormality is seen. No gallstones,
gallbladder wall thickening, or biliary dilatation.

Pancreas: Unremarkable. No pancreatic ductal dilatation or
surrounding inflammatory changes.

Spleen: Enlarged, measuring 15 cm craniocaudad.

Adrenals/Urinary Tract: Adrenal glands are normal. There is no
hydronephrosis. The bladder is slightly thick walled. There is
questionable subtle right perinephric edema.

Stomach/Bowel: Stomach is within normal limits. Appendix appears
normal. No evidence of bowel wall thickening, distention, or
inflammatory changes.

Vascular/Lymphatic: No significant vascular findings are present. No
enlarged abdominal or pelvic lymph nodes.

Reproductive: Status post hysterectomy. No adnexal masses.

Other: Negative for free air or free fluid

Musculoskeletal: No acute or significant osseous findings.
IMPRESSION: 1. Negative for hydronephrosis or ureteral stone.
2. Slightly thick-walled urinary bladder either due to under
distension or cystitis. Questionable subtle right perinephric edema,
nonspecific though could consider pyelonephritis. Recommend
correlation with urinalysis.
3. Splenomegaly.

## 2020-05-22 MED ORDER — MORPHINE SULFATE (PF) 2 MG/ML IV SOLN
2.0000 mg | INTRAVENOUS | Status: DC | PRN
  Administered 2020-05-23 – 2020-05-24 (×4): 2 mg via INTRAVENOUS
  Filled 2020-05-22 (×4): qty 1

## 2020-05-22 MED ORDER — ONDANSETRON 4 MG PO TBDP
4.0000 mg | ORAL_TABLET | Freq: Once | ORAL | Status: AC | PRN
  Administered 2020-05-22: 4 mg via ORAL
  Filled 2020-05-22: qty 1

## 2020-05-22 MED ORDER — MORPHINE SULFATE (PF) 4 MG/ML IV SOLN
4.0000 mg | Freq: Once | INTRAVENOUS | Status: AC
  Administered 2020-05-22: 4 mg via INTRAVENOUS
  Filled 2020-05-22: qty 1

## 2020-05-22 MED ORDER — SODIUM CHLORIDE 0.9 % IV SOLN: INTRAVENOUS | Status: DC

## 2020-05-22 MED ORDER — KETOROLAC TROMETHAMINE 30 MG/ML IJ SOLN
30.0000 mg | Freq: Four times a day (QID) | INTRAMUSCULAR | Status: DC | PRN
  Administered 2020-05-23: 30 mg via INTRAVENOUS
  Filled 2020-05-22: qty 1

## 2020-05-22 MED ORDER — HYDROCODONE-ACETAMINOPHEN 5-325 MG PO TABS
1.0000 | ORAL_TABLET | ORAL | Status: DC | PRN
  Administered 2020-05-22 – 2020-05-24 (×3): 2 via ORAL
  Filled 2020-05-22 (×3): qty 2

## 2020-05-22 MED ORDER — ONDANSETRON HCL 4 MG PO TABS: 4.0000 mg | ORAL_TABLET | Freq: Four times a day (QID) | ORAL | Status: DC | PRN

## 2020-05-22 MED ORDER — SODIUM CHLORIDE 0.9 % IV BOLUS
1000.0000 mL | Freq: Once | INTRAVENOUS | Status: AC
  Administered 2020-05-22: 1000 mL via INTRAVENOUS

## 2020-05-22 MED ORDER — ENOXAPARIN SODIUM 40 MG/0.4ML ~~LOC~~ SOLN: 40.0000 mg | SUBCUTANEOUS | Status: DC

## 2020-05-22 MED ORDER — SODIUM CHLORIDE 0.9 % IV SOLN
2.0000 g | Freq: Once | INTRAVENOUS | Status: AC
  Administered 2020-05-22: 2 g via INTRAVENOUS
  Filled 2020-05-22: qty 20

## 2020-05-22 MED ORDER — ACETAMINOPHEN 325 MG PO TABS
650.0000 mg | ORAL_TABLET | Freq: Once | ORAL | Status: AC | PRN
  Administered 2020-05-22: 650 mg via ORAL
  Filled 2020-05-22: qty 2

## 2020-05-22 MED ORDER — KETOROLAC TROMETHAMINE 30 MG/ML IJ SOLN
15.0000 mg | Freq: Once | INTRAMUSCULAR | Status: AC
  Administered 2020-05-22: 15 mg via INTRAVENOUS
  Filled 2020-05-22: qty 1

## 2020-05-22 MED ORDER — ONDANSETRON HCL 4 MG/2ML IJ SOLN
4.0000 mg | Freq: Four times a day (QID) | INTRAMUSCULAR | Status: DC | PRN
  Administered 2020-05-23 – 2020-05-24 (×4): 4 mg via INTRAVENOUS
  Filled 2020-05-22 (×4): qty 2

## 2020-05-22 MED ORDER — ACETAMINOPHEN 325 MG PO TABS
650.0000 mg | ORAL_TABLET | Freq: Four times a day (QID) | ORAL | Status: DC | PRN
  Administered 2020-05-23 – 2020-05-24 (×3): 650 mg via ORAL
  Filled 2020-05-22 (×3): qty 2

## 2020-05-22 MED ORDER — ACETAMINOPHEN 650 MG RE SUPP: 650.0000 mg | Freq: Four times a day (QID) | RECTAL | Status: DC | PRN

## 2020-05-22 MED ORDER — ONDANSETRON HCL 4 MG/2ML IJ SOLN
4.0000 mg | Freq: Once | INTRAMUSCULAR | Status: AC
  Administered 2020-05-22: 4 mg via INTRAVENOUS
  Filled 2020-05-22: qty 2

## 2020-05-22 MED ORDER — SODIUM CHLORIDE 0.9 % IV SOLN
1.0000 g | INTRAVENOUS | Status: DC
  Administered 2020-05-23: 1 g via INTRAVENOUS
  Filled 2020-05-22: qty 10
  Filled 2020-05-22: qty 1

## 2020-05-22 NOTE — ED Triage Notes (Signed)
Patient c/o ruq pain with nausea. Patient has chills/body aches. Fever present in triage of 101.4oral

## 2020-05-22 NOTE — ED Provider Notes (Signed)
[facility] Emergency Department Provider Note  ____________________________________________   First MD Initiated Contact with Patient 05/22/20 1937     (approximate)  I have reviewed the triage vital signs and the nursing notes.   HISTORY  Chief Complaint Abdominal Pain and Fever    HPI Jessica Peters is a 39 y.o. female  Here with R flank pain, fever, nausea, chills. Sx started 2-3 days ago as mild, gradual onset and progressively worsening R CVA and upper abd pain, along with chills. She initially drank water, cranberry juice and felt better. However, today, her pain has returned, worsened and been associated with nausea, vomiting,a nd chills with rigors. She has felt unwell all day, no appetite, and weak. She has had associated urinary urgency.         Past Medical History:  Diagnosis Date  . Allergy   . Anemia   . Anxiety   . BRCA negative 02/2018   MyRisk neg  . Endometriosis   . Family history of breast cancer 02/2018   My Risk neg  . Headache    h/o migraines  . Increased risk of breast cancer 02/2018   IBIS=18.8%/riskscore=26.3%  . Ovarian cyst   . Ovarian cyst   . PTSD (post-traumatic stress disorder)   . PTSD (post-traumatic stress disorder)   . Thrombocytopenia ([place])   . Vitamin D deficiency     Patient Active Problem List   Diagnosis Date Noted  . Acute pyelonephritis 05/22/2020  . Status post laparoscopic hysterectomy 12/10/2018  . Chronic pelvic pain in female 12/04/2018  . Right lower quadrant abdominal pain 03/16/2018  . Allergic rhinitis 10/03/2017  . Depression, recurrent ([place]) 10/03/2017  . Anxiety 06/13/2017  . Vitamin D deficiency   . Family history of breast cancer   . Breast lump on right side at 1 o'clock position 02/03/2017    Past Surgical History:  Procedure Laterality Date  . CYSTOSCOPY Bilateral 12/10/2018   Procedure: CYSTOSCOPY;  Surgeon: Will Bonnet, MD;  Location: [facility] ORS;  Service:  Gynecology;  Laterality: Bilateral;  . DILITATION & CURRETTAGE/HYSTROSCOPY WITH NOVASURE ABLATION  12/07/2015   Procedure: DILATATION & CURETTAGE/HYSTEROSCOPY WITH NOVASURE ABLATION;  Surgeon: Will Bonnet, MD;  Location: [facility] ORS;  Service: Gynecology;;  . LAPAROSCOPIC HYSTERECTOMY Bilateral 12/10/2018   Procedure: HYSTERECTOMY TOTAL LAPAROSCOPIC BILATERAL SALPRINGETOMY;  Surgeon: Will Bonnet, MD;  Location: [facility] ORS;  Service: Gynecology;  Laterality: Bilateral;  . MOUTH SURGERY    . TONSILLECTOMY    . TUBAL LIGATION      Prior to Admission medications   Medication Sig Start Date End Date Taking? Authorizing Provider  ALPRAZolam (XANAX) 0.25 MG tablet Take 1 tablet (0.25 mg total) by mouth daily as needed for anxiety. 05/18/20   Volney American, PA-C  sertraline (ZOLOFT) 50 MG tablet Take 1 tablet (50 mg total) by mouth daily. 05/18/20   Volney American, PA-C    Allergies Patient has no known allergies.  Family History  Problem Relation Age of Onset  . Breast cancer Paternal Aunt 20  . Cancer Paternal Aunt        Breast  . Breast cancer Paternal Grandmother 86  . Stroke Paternal Grandmother   . Clotting disorder Paternal Grandmother   . Cancer Paternal Grandmother        Breast  . Depression Mother   . Depression Sister   . Alcohol abuse Maternal Grandmother   . Stroke Maternal Grandmother   . Depression Maternal Grandmother   .  Cancer Maternal Grandmother        Liver  . Alcohol abuse Maternal Grandfather   . Stroke Maternal Grandfather   . Cancer Maternal Grandfather        Liver  . Stroke Paternal Grandfather   . Heart disease Paternal Grandfather     Social History Social History   Tobacco Use  . Smoking status: Current Every Day Smoker    Packs/day: 0.50    Years: 13.00    Pack years: 6.50    Types: Cigarettes  . Smokeless tobacco: Never Used  Vaping Use  . Vaping Use: Never used  Substance Use Topics  . Alcohol use: Yes     Comment: socially  . Drug use: No    Review of Systems  Review of Systems  Constitutional: Positive for chills, fatigue and fever.  HENT: Negative for congestion and sore throat.   Eyes: Negative for visual disturbance.  Respiratory: Negative for cough and shortness of breath.   Cardiovascular: Negative for chest pain.  Gastrointestinal: Positive for abdominal pain, nausea and vomiting. Negative for diarrhea.  Genitourinary: Positive for dysuria, flank pain, hematuria and pelvic pain.  Musculoskeletal: Negative for back pain and neck pain.  Skin: Negative for rash and wound.  Neurological: Negative for weakness.  All other systems reviewed and are negative.    ____________________________________________  PHYSICAL EXAM:      VITAL SIGNS: ED Triage Vitals  Enc Vitals Group     BP 05/22/20 1830 120/84     Pulse Rate 05/22/20 1830 (!) 111     Resp 05/22/20 1830 18     Temp 05/22/20 1830 (!) 101.4 F (38.6 C)     Temp Source 05/22/20 1830 Oral     SpO2 05/22/20 1830 98 %     Weight 05/22/20 1831 161 lb (73 kg)     Height 05/22/20 1831 5' 10" (1.778 m)     Head Circumference --      Peak Flow --      Pain Score 05/22/20 1831 10     Pain Loc --      Pain Edu? --      Excl. in [place]? --      Physical Exam Vitals and nursing note reviewed.  Constitutional:      General: She is not in acute distress.    Appearance: She is well-developed.  HENT:     Head: Normocephalic and atraumatic.  Eyes:     Conjunctiva/sclera: Conjunctivae normal.  Cardiovascular:     Rate and Rhythm: Regular rhythm. Tachycardia present.     Heart sounds: Normal heart sounds. No murmur heard.  No friction rub.  Pulmonary:     Effort: Pulmonary effort is normal. No respiratory distress.     Breath sounds: Normal breath sounds. No wheezing or rales.  Abdominal:     General: Abdomen is flat. Bowel sounds are normal. There is no distension.     Palpations: Abdomen is soft.     Tenderness: There is  abdominal tenderness in the right upper quadrant and right lower quadrant. There is right CVA tenderness. There is no left CVA tenderness, guarding or rebound. Negative signs include Murphy's sign and McBurney's sign.  Musculoskeletal:     Cervical back: Neck supple.  Skin:    General: Skin is warm.     Capillary Refill: Capillary refill takes less than 2 seconds.  Neurological:     Mental Status: She is alert and oriented to person, place, and time.  Motor: No abnormal muscle tone.       ____________________________________________   LABS (all labs ordered are listed, but only abnormal results are displayed)  Labs Reviewed  COMPREHENSIVE METABOLIC PANEL - Abnormal; Notable for the following components:      Result Value   Alkaline Phosphatase 22 (*)    All other components within normal limits  CBC - Abnormal; Notable for the following components:   Platelets 72 (*)    All other components within normal limits  URINALYSIS, COMPLETE (UACMP) WITH MICROSCOPIC - Abnormal; Notable for the following components:   Color, Urine YELLOW (*)    APPearance CLOUDY (*)    Hgb urine dipstick LARGE (*)    Ketones, ur 20 (*)    Protein, ur 100 (*)    Nitrite POSITIVE (*)    Leukocytes,Ua LARGE (*)    WBC, UA >50 (*)    Bacteria, UA FEW (*)    All other components within normal limits  CULTURE, BLOOD (ROUTINE X 2)  CULTURE, BLOOD (ROUTINE X 2)  URINE CULTURE  SARS CORONAVIRUS 2 BY RT PCR (HOSPITAL ORDER, [place] [facility])  LIPASE, BLOOD  HIV ANTIBODY (ROUTINE TESTING W REFLEX)  CBC  BASIC METABOLIC PANEL  CBC  POC URINE PREG, ED  POCT PREGNANCY, URINE    ____________________________________________  EKG: Normal sinus rhythm, VR 100. PR 130, QRS 80, QTc 430. No acute ST elevations or depresisons. ________________________________________  RADIOLOGY All imaging, including plain films, CT scans, and ultrasounds, independently reviewed by me, and  interpretations confirmed via formal radiology reads.  ED MD interpretation:   CT Stone: Pending  Official radiology report(s): CT Renal Stone Study  Result Date: 05/22/2020 CLINICAL DATA:  Right-sided pain EXAM: CT ABDOMEN AND PELVIS WITHOUT CONTRAST TECHNIQUE: Multidetector CT imaging of the abdomen and pelvis was performed following the standard protocol without IV contrast. COMPARISON:  CT 09/08/2018 FINDINGS: Lower chest: No acute abnormality. Hepatobiliary: No focal liver abnormality is seen. No gallstones, gallbladder wall thickening, or biliary dilatation. Pancreas: Unremarkable. No pancreatic ductal dilatation or surrounding inflammatory changes. Spleen: Enlarged, measuring 15 cm craniocaudad. Adrenals/Urinary Tract: Adrenal glands are normal. There is no hydronephrosis. The bladder is slightly thick walled. There is questionable subtle right perinephric edema. Stomach/Bowel: Stomach is within normal limits. Appendix appears normal. No evidence of bowel wall thickening, distention, or inflammatory changes. Vascular/Lymphatic: No significant vascular findings are present. No enlarged abdominal or pelvic lymph nodes. Reproductive: Status post hysterectomy. No adnexal masses. Other: Negative for free air or free fluid Musculoskeletal: No acute or significant osseous findings. IMPRESSION: 1. Negative for hydronephrosis or ureteral stone. 2. Slightly thick-walled urinary bladder either due to under distension or cystitis. Questionable subtle right perinephric edema, nonspecific though could consider pyelonephritis. Recommend correlation with urinalysis. 3. Splenomegaly. Electronically Signed   By: Donavan Foil M.D.   On: 05/22/2020 21:04    ____________________________________________  PROCEDURES   Procedure(s) performed (including Critical Care):  .Critical Care Performed by: Duffy Bruce, MD Authorized by: Duffy Bruce, MD   Critical care provider statement:    Critical care time  (minutes):  35   Critical care time was exclusive of:  Separately billable procedures and treating other patients and teaching time   Critical care was necessary to treat or prevent imminent or life-threatening deterioration of the following conditions:  Cardiac failure, circulatory failure and sepsis   Critical care was time spent personally by me on the following activities:  Development of treatment plan with patient or surrogate,  discussions with consultants, evaluation of patient's response to treatment, examination of patient, obtaining history from patient or surrogate, ordering and performing treatments and interventions, ordering and review of laboratory studies, ordering and review of radiographic studies, pulse oximetry, re-evaluation of patient's condition and review of old charts   I assumed direction of critical care for this patient from another provider in my specialty: no      ____________________________________________  INITIAL IMPRESSION / MDM / [place] / ED COURSE  As part of my medical decision making, I reviewed the following data within the [place] notes reviewed and incorporated, Old chart reviewed, Notes from prior ED visits, and [place] was evaluated in Emergency Department on 05/22/2020 for the symptoms described in the history of present illness. She was evaluated in the context of the global COVID-19 pandemic, which necessitated consideration that the patient might be at risk for infection with the SARS-CoV-2 virus that causes COVID-19. Institutional protocols and algorithms that pertain to the evaluation of patients at risk for COVID-19 are in a state of rapid change based on information released by regulatory bodies including the CDC and federal and state organizations. These policies and algorithms were followed during the patient's care in the ED.  Some ED evaluations and  interventions may be delayed as a result of limited staffing during the pandemic.*     Medical Decision Making:  39 yo F here with fever, tachycardia, R flank pain likely 2/2 pyelonephritis. +R CVAT on exam though diffusely tender throughout R hemiabdomen. Labs show mild thrombocytopenia of unclear significance - no anemia to suggest hemolysis. CMP with normal renal function, LFTs, no signs of TTP. Lipase is normal. Will f/u CT, fluids, response to pain meds. Rocephin given for early sepsis physiology (tachycardia, fever). Likely admit.  ____________________________________________  FINAL CLINICAL IMPRESSION(S) / ED DIAGNOSES  Final diagnoses:  Pyelonephritis     MEDICATIONS GIVEN DURING THIS VISIT:  Medications  cefTRIAXone (ROCEPHIN) 1 g in sodium chloride 0.9 % 100 mL IVPB (has no administration in time range)  enoxaparin (LOVENOX) injection 40 mg (has no administration in time range)  0.9 %  sodium chloride infusion (has no administration in time range)  acetaminophen (TYLENOL) tablet 650 mg (has no administration in time range)    Or  acetaminophen (TYLENOL) suppository 650 mg (has no administration in time range)  HYDROcodone-acetaminophen (NORCO/VICODIN) 5-325 MG per tablet 1-2 tablet (has no administration in time range)  ketorolac (TORADOL) 30 MG/ML injection 30 mg (has no administration in time range)  ondansetron (ZOFRAN) tablet 4 mg (has no administration in time range)    Or  ondansetron (ZOFRAN) injection 4 mg (has no administration in time range)  morphine 2 MG/ML injection 2 mg (has no administration in time range)  acetaminophen (TYLENOL) tablet 650 mg (650 mg Oral Given 05/22/20 1836)  ondansetron (ZOFRAN-ODT) disintegrating tablet 4 mg (4 mg Oral Given 05/22/20 1836)  morphine 4 MG/ML injection 4 mg (4 mg Intravenous Given 05/22/20 2040)  ondansetron (ZOFRAN) injection 4 mg (4 mg Intravenous Given 05/22/20 2057)  ketorolac (TORADOL) 30 MG/ML injection 15 mg (15 mg  Intravenous Given 05/22/20 2040)  sodium chloride 0.9 % bolus 1,000 mL (1,000 mLs Intravenous New Bag/Given 05/22/20 2039)  sodium chloride 0.9 % bolus 1,000 mL (1,000 mLs Intravenous New Bag/Given 05/22/20 2104)  cefTRIAXone (ROCEPHIN) 2 g in sodium chloride 0.9 % 100 mL IVPB (0 g Intravenous Stopped 05/22/20  2223)     ED Discharge Orders    None       Note:  This document was prepared using Dragon voice recognition software and may include unintentional dictation errors.   Duffy Bruce, MD 05/22/20 2242

## 2020-05-22 NOTE — H&P (Signed)
History and Physical    Jessica Peters PQZ:[id number] DOB: [birth year] DOA: 05/22/2020  PCP: Valerie Roys, DO   Patient coming from: Home  I have personally briefly reviewed patient's old medical records in [place]  Chief Complaint: Right-sided flank pain, nausea, fever  HPI: Jessica Peters is a 39 y.o. female with history of anxiety who presents to the ER with a 3-day history of right flank pain that has been progressively no associated with the pain in the right mid back.  Associated with fever and chills.  Initially improved with cranberry juice but now continues to worsen.  Has associated nausea, and vomiting.  Denies change in bowel habits.  Denies cough or shortness of breath ED Course: On arrival patient looked unwell.  She had a temperature of 101.4 and was tachycardic at 111 with otherwise normal vitals.  Blood work showed normal WBC of 9000.  Lactic acid not done from the ER.  Urine showed pyuria.  Lipase negative.  CT renal stone study showed thick-walled urinary bladder as well as subtle right perinephric edema with consideration for pyelonephritis.  Patient started on Rocephin.  Hospitalist consult requested  Review of Systems: As per HPI otherwise all other systems on review of systems negative.    Past Medical History:  Diagnosis Date  . Allergy   . Anemia   . Anxiety   . BRCA negative 02/2018   MyRisk neg  . Endometriosis   . Family history of breast cancer 02/2018   My Risk neg  . Headache    h/o migraines  . Increased risk of breast cancer 02/2018   IBIS=18.8%/riskscore=26.3%  . Ovarian cyst   . Ovarian cyst   . PTSD (post-traumatic stress disorder)   . PTSD (post-traumatic stress disorder)   . Thrombocytopenia ([place])   . Vitamin D deficiency     Past Surgical History:  Procedure Laterality Date  . CYSTOSCOPY Bilateral 12/10/2018   Procedure: CYSTOSCOPY;  Surgeon: Will Bonnet, MD;  Location: [facility] ORS;  Service: Gynecology;  Laterality:  Bilateral;  . DILITATION & CURRETTAGE/HYSTROSCOPY WITH NOVASURE ABLATION  12/07/2015   Procedure: DILATATION & CURETTAGE/HYSTEROSCOPY WITH NOVASURE ABLATION;  Surgeon: Will Bonnet, MD;  Location: [facility] ORS;  Service: Gynecology;;  . LAPAROSCOPIC HYSTERECTOMY Bilateral 12/10/2018   Procedure: HYSTERECTOMY TOTAL LAPAROSCOPIC BILATERAL SALPRINGETOMY;  Surgeon: Will Bonnet, MD;  Location: [facility] ORS;  Service: Gynecology;  Laterality: Bilateral;  . MOUTH SURGERY    . TONSILLECTOMY    . TUBAL LIGATION       reports that she has been smoking cigarettes. She has a 6.50 pack-year smoking history. She has never used smokeless tobacco. She reports current alcohol use. She reports that she does not use drugs.  No Known Allergies  Family History  Problem Relation Age of Onset  . Breast cancer Paternal Aunt 80  . Cancer Paternal Aunt        Breast  . Breast cancer Paternal Grandmother 41  . Stroke Paternal Grandmother   . Clotting disorder Paternal Grandmother   . Cancer Paternal Grandmother        Breast  . Depression Mother   . Depression Sister   . Alcohol abuse Maternal Grandmother   . Stroke Maternal Grandmother   . Depression Maternal Grandmother   . Cancer Maternal Grandmother        Liver  . Alcohol abuse Maternal Grandfather   . Stroke Maternal Grandfather   . Cancer Maternal Grandfather  Liver  . Stroke Paternal Grandfather   . Heart disease Paternal Grandfather       Prior to Admission medications   Medication Sig Start Date End Date Taking? Authorizing Provider  ALPRAZolam (XANAX) 0.25 MG tablet Take 1 tablet (0.25 mg total) by mouth daily as needed for anxiety. 05/18/20   Volney American, PA-C  sertraline (ZOLOFT) 50 MG tablet Take 1 tablet (50 mg total) by mouth daily. 05/18/20   Volney American, PA-C    Physical Exam: Vitals:   05/22/20 2128 05/22/20 2129 05/22/20 2130 05/22/20 2140  BP:   106/70 106/70  Pulse: 85 88 85 91  Resp:    20    Temp:      TempSrc:      SpO2: 98% 99% 99% 98%  Weight:      Height:         Vitals:   05/22/20 2128 05/22/20 2129 05/22/20 2130 05/22/20 2140  BP:   106/70 106/70  Pulse: 85 88 85 91  Resp:    20  Temp:      TempSrc:      SpO2: 98% 99% 99% 98%  Weight:      Height:          Constitutional: Alert and oriented x 3 .  Appears in discomfort from pain HEENT:      Head: Normocephalic and atraumatic.         Eyes: PERLA, EOMI, Conjunctivae are normal. Sclera is non-icteric.       Mouth/Throat: Mucous membranes are moist.       Neck: Supple with no signs of meningismus. Cardiovascular: Regular rate and rhythm. No murmurs, gallops, or rubs. 2+ symmetrical distal pulses are present . No JVD. No LE edema Respiratory: Respiratory effort normal .Lungs sounds clear bilaterally. No wheezes, crackles, or rhonchi.  Gastrointestinal:  Tender in right upper quadrant, non distended with positive bowel sounds. No rebound or guarding. Genitourinary:  Right CVA tenderness. Musculoskeletal: Nontender with normal range of motion in all extremities. No cyanosis, or erythema of extremities. Neurologic: Normal speech and language. Face is symmetric. Moving all extremities. No gross focal neurologic deficits . Skin: Skin is warm, dry.  No rash or ulcers Psychiatric: Mood and affect are normal Speech and behavior are normal   Labs on Admission: I have personally reviewed following labs and imaging studies  CBC: Recent Labs  Lab 05/22/20 1836  WBC 9.2  HGB 13.9  HCT 41.7  MCV 92.5  PLT 72*   Basic Metabolic Panel: Recent Labs  Lab 05/22/20 1836  NA 135  K 3.7  CL 100  CO2 23  GLUCOSE 99  BUN 12  CREATININE 0.87  CALCIUM 8.9   GFR: Estimated Creatinine Clearance: 95.7 mL/min (by C-G formula based on SCr of 0.87 mg/dL). Liver Function Tests: Recent Labs  Lab 05/22/20 1836  AST 21  ALT 16  ALKPHOS 22*  BILITOT 1.1  PROT 7.4  ALBUMIN 4.5   Recent Labs  Lab 05/22/20 1836   LIPASE 20   No results for input(s): AMMONIA in the last 168 hours. Coagulation Profile: No results for input(s): INR, PROTIME in the last 168 hours. Cardiac Enzymes: No results for input(s): CKTOTAL, CKMB, CKMBINDEX, TROPONINI in the last 168 hours. BNP (last 3 results) No results for input(s): PROBNP in the last 8760 hours. HbA1C: No results for input(s): HGBA1C in the last 72 hours. CBG: No results for input(s): GLUCAP in the last 168 hours. Lipid Profile: No results  for input(s): CHOL, HDL, LDLCALC, TRIG, CHOLHDL, LDLDIRECT in the last 72 hours. Thyroid Function Tests: No results for input(s): TSH, T4TOTAL, FREET4, T3FREE, THYROIDAB in the last 72 hours. Anemia Panel: No results for input(s): VITAMINB12, FOLATE, FERRITIN, TIBC, IRON, RETICCTPCT in the last 72 hours. Urine analysis:    Component Value Date/Time   COLORURINE YELLOW (A) 05/22/2020 1838   APPEARANCEUR CLOUDY (A) 05/22/2020 1838   APPEARANCEUR Clear 05/04/2019 0828   LABSPEC 1.012 05/22/2020 1838   LABSPEC 1.023 03/31/2012 0752   PHURINE 6.0 05/22/2020 1838   GLUCOSEU NEGATIVE 05/22/2020 1838   GLUCOSEU Negative 03/31/2012 0752   HGBUR LARGE (A) 05/22/2020 1838   BILIRUBINUR NEGATIVE 05/22/2020 1838   BILIRUBINUR Negative 05/04/2019 0828   BILIRUBINUR Negative 03/31/2012 0752   KETONESUR 20 (A) 05/22/2020 1838   PROTEINUR 100 (A) 05/22/2020 1838   NITRITE POSITIVE (A) 05/22/2020 1838   LEUKOCYTESUR LARGE (A) 05/22/2020 1838   LEUKOCYTESUR Negative 03/31/2012 0752    Radiological Exams on Admission: CT Renal Stone Study  Result Date: 05/22/2020 CLINICAL DATA:  Right-sided pain EXAM: CT ABDOMEN AND PELVIS WITHOUT CONTRAST TECHNIQUE: Multidetector CT imaging of the abdomen and pelvis was performed following the standard protocol without IV contrast. COMPARISON:  CT 09/08/2018 FINDINGS: Lower chest: No acute abnormality. Hepatobiliary: No focal liver abnormality is seen. No gallstones, gallbladder wall  thickening, or biliary dilatation. Pancreas: Unremarkable. No pancreatic ductal dilatation or surrounding inflammatory changes. Spleen: Enlarged, measuring 15 cm craniocaudad. Adrenals/Urinary Tract: Adrenal glands are normal. There is no hydronephrosis. The bladder is slightly thick walled. There is questionable subtle right perinephric edema. Stomach/Bowel: Stomach is within normal limits. Appendix appears normal. No evidence of bowel wall thickening, distention, or inflammatory changes. Vascular/Lymphatic: No significant vascular findings are present. No enlarged abdominal or pelvic lymph nodes. Reproductive: Status post hysterectomy. No adnexal masses. Other: Negative for free air or free fluid Musculoskeletal: No acute or significant osseous findings. IMPRESSION: 1. Negative for hydronephrosis or ureteral stone. 2. Slightly thick-walled urinary bladder either due to under distension or cystitis. Questionable subtle right perinephric edema, nonspecific though could consider pyelonephritis. Recommend correlation with urinalysis. 3. Splenomegaly. Electronically Signed   By: Donavan Foil M.D.   On: 05/22/2020 21:04    EKG: Independently reviewed. Interpretation : Normal sinus rhythm  Assessment/Plan 39 year old female with history of anxiety presenting with right-sided flank pain, fever nausea and vomiting    Acute pyelonephritis -Urinalysis shows pyuria.  CT abdomen and pelvis shows some perinephric edema -Continue Rocephin started in the emergency room -Continue IV fluids -Pain management -Follow cultures    DVT prophylaxis: Lovenox  Code Status: full code  Family Communication:  none  Disposition Plan: Back to previous home environment Consults called: none  Status:obs    Athena Masse MD [facility]     05/22/2020, 9:49 PM

## 2020-05-23 DIAGNOSIS — Z9079 Acquired absence of other genital organ(s): Secondary | ICD-10-CM | POA: Diagnosis not present

## 2020-05-23 DIAGNOSIS — N12 Tubulo-interstitial nephritis, not specified as acute or chronic: Secondary | ICD-10-CM | POA: Diagnosis not present

## 2020-05-23 DIAGNOSIS — F17210 Nicotine dependence, cigarettes, uncomplicated: Secondary | ICD-10-CM | POA: Diagnosis present

## 2020-05-23 DIAGNOSIS — Z7289 Other problems related to lifestyle: Secondary | ICD-10-CM | POA: Diagnosis not present

## 2020-05-23 DIAGNOSIS — N10 Acute tubulo-interstitial nephritis: Secondary | ICD-10-CM

## 2020-05-23 DIAGNOSIS — R109 Unspecified abdominal pain: Secondary | ICD-10-CM | POA: Diagnosis not present

## 2020-05-23 DIAGNOSIS — E876 Hypokalemia: Secondary | ICD-10-CM

## 2020-05-23 DIAGNOSIS — Z90710 Acquired absence of both cervix and uterus: Secondary | ICD-10-CM | POA: Diagnosis not present

## 2020-05-23 DIAGNOSIS — B9620 Unspecified Escherichia coli [E. coli] as the cause of diseases classified elsewhere: Secondary | ICD-10-CM | POA: Diagnosis present

## 2020-05-23 DIAGNOSIS — F339 Major depressive disorder, recurrent, unspecified: Secondary | ICD-10-CM | POA: Diagnosis present

## 2020-05-23 DIAGNOSIS — N3289 Other specified disorders of bladder: Secondary | ICD-10-CM | POA: Diagnosis not present

## 2020-05-23 DIAGNOSIS — R161 Splenomegaly, not elsewhere classified: Secondary | ICD-10-CM | POA: Diagnosis not present

## 2020-05-23 DIAGNOSIS — R5383 Other fatigue: Secondary | ICD-10-CM | POA: Diagnosis not present

## 2020-05-23 DIAGNOSIS — Z79899 Other long term (current) drug therapy: Secondary | ICD-10-CM | POA: Diagnosis not present

## 2020-05-23 DIAGNOSIS — D696 Thrombocytopenia, unspecified: Secondary | ICD-10-CM | POA: Diagnosis present

## 2020-05-23 DIAGNOSIS — R112 Nausea with vomiting, unspecified: Secondary | ICD-10-CM | POA: Diagnosis not present

## 2020-05-23 DIAGNOSIS — Z20822 Contact with and (suspected) exposure to covid-19: Secondary | ICD-10-CM | POA: Diagnosis not present

## 2020-05-23 DIAGNOSIS — J309 Allergic rhinitis, unspecified: Secondary | ICD-10-CM | POA: Diagnosis present

## 2020-05-23 DIAGNOSIS — Z803 Family history of malignant neoplasm of breast: Secondary | ICD-10-CM | POA: Diagnosis not present

## 2020-05-23 DIAGNOSIS — A4151 Sepsis due to Escherichia coli [E. coli]: Secondary | ICD-10-CM | POA: Diagnosis not present

## 2020-05-23 DIAGNOSIS — R102 Pelvic and perineal pain: Secondary | ICD-10-CM | POA: Diagnosis not present

## 2020-05-23 DIAGNOSIS — A419 Sepsis, unspecified organism: Secondary | ICD-10-CM | POA: Diagnosis present

## 2020-05-23 DIAGNOSIS — F419 Anxiety disorder, unspecified: Secondary | ICD-10-CM | POA: Diagnosis present

## 2020-05-23 DIAGNOSIS — F4310 Post-traumatic stress disorder, unspecified: Secondary | ICD-10-CM | POA: Diagnosis present

## 2020-05-23 LAB — CBC
HCT: 35 % — ABNORMAL LOW (ref 36.0–46.0)
Hemoglobin: 12.2 g/dL (ref 12.0–15.0)
MCH: 31.9 pg (ref 26.0–34.0)
MCHC: 34.9 g/dL (ref 30.0–36.0)
MCV: 91.6 fL (ref 80.0–100.0)
Platelets: 121 10*3/uL — ABNORMAL LOW (ref 150–400)
RBC: 3.82 MIL/uL — ABNORMAL LOW (ref 3.87–5.11)
RDW: 12.2 % (ref 11.5–15.5)
WBC: 9.3 10*3/uL (ref 4.0–10.5)
nRBC: 0 % (ref 0.0–0.2)

## 2020-05-23 LAB — BASIC METABOLIC PANEL
Anion gap: 8 (ref 5–15)
BUN: 12 mg/dL (ref 6–20)
CO2: 23 mmol/L (ref 22–32)
Calcium: 7.7 mg/dL — ABNORMAL LOW (ref 8.9–10.3)
Chloride: 104 mmol/L (ref 98–111)
Creatinine, Ser: 0.82 mg/dL (ref 0.44–1.00)
GFR calc Af Amer: >60 mL/min (ref >60)
GFR calc non Af Amer: >60 mL/min (ref >60)
Glucose, Bld: 111 mg/dL — ABNORMAL HIGH (ref 70–99)
Potassium: 3.1 mmol/L — ABNORMAL LOW (ref 3.5–5.1)
Sodium: 135 mmol/L (ref 135–145)

## 2020-05-23 LAB — HIV ANTIBODY (ROUTINE TESTING W REFLEX): HIV Screen 4th Generation wRfx: NONREACTIVE

## 2020-05-23 MED ORDER — POTASSIUM CHLORIDE CRYS ER 20 MEQ PO TBCR
40.0000 meq | EXTENDED_RELEASE_TABLET | Freq: Once | ORAL | Status: AC
  Administered 2020-05-23: 40 meq via ORAL
  Filled 2020-05-23: qty 2

## 2020-05-23 MED ORDER — SERTRALINE HCL 50 MG PO TABS
50.0000 mg | ORAL_TABLET | Freq: Every day | ORAL | Status: DC
  Administered 2020-05-24: 50 mg via ORAL
  Filled 2020-05-23: qty 1

## 2020-05-23 MED ORDER — PROMETHAZINE HCL 25 MG/ML IJ SOLN
12.5000 mg | Freq: Once | INTRAMUSCULAR | Status: AC
  Administered 2020-05-23: 12.5 mg via INTRAVENOUS
  Filled 2020-05-23: qty 1

## 2020-05-23 NOTE — Progress Notes (Signed)
PROGRESS NOTE    NAVI Peters  ALP:[id number] DOB: Feb 28, 1981 DOA: 05/22/2020 PCP: Valerie Roys, DO    Brief Narrative:  Jessica Peters is a 39 y.o. female with history of anxiety who presents to the ER with a 3-day history of right flank pain that has been progressively no associated with the pain in the right mid back.  Associated with fever and chills.  Initially improved with cranberry juice but now continues to worsen.  Has associated nausea, and vomiting.  Denies change in bowel habits.  Denies cough or shortness of breath.CT renal stone study showed thick-walled urinary bladder as well as subtle right perinephric edema with consideration for pyelonephritis.  Patient started on Rocephin    Consultants:     Procedures:   Antimicrobials:   Ceftriaxone   Subjective: Febrile this a.m.  Still with right CVA tenderness.  No vomiting but has nausea.  States has pain if she moves.  Objective: Vitals:   05/22/20 2320 05/23/20 0431 05/23/20 0653 05/23/20 1528  BP: 109/61 (!) 101/56  108/70  Pulse: 88 96  89  Resp: 16 19  19   Temp: 98.6 F (37 C) (!) 100.7 F (38.2 C) 98.9 F (37.2 C) 99 F (37.2 C)  TempSrc: Oral Oral Oral Oral  SpO2: 99% 95%  97%  Weight:      Height:        Intake/Output Summary (Last 24 hours) at 05/23/2020 1642 Last data filed at 05/23/2020 1300 Gross per 24 hour  Intake 1580 ml  Output 300 ml  Net 1280 ml   Filed Weights   05/22/20 1831  Weight: 73 kg    Examination:  General exam: Appears calm, not comfortable when moving in bed.  Pleasant Respiratory system: Clear to auscultation. Respiratory effort normal. Cardiovascular system: S1 & S2 heard, RRR. No JVD, murmurs, rubs, gallops or clicks. Gastrointestinal system: Abdomen is nondistended, soft and nontender.+bs. RCVA tenderness. Central nervous system: Alert and oriented. No focal neurological deficits. Extremities: No edema Skin: Warm dry Psychiatry: Judgement and insight  appear normal. Mood & affect appropriate.     Data Reviewed: I have personally reviewed following labs and imaging studies  CBC: Recent Labs  Lab 05/22/20 1836 05/23/20 0437  WBC 9.2 9.3  HGB 13.9 12.2  HCT 41.7 35.0*  MCV 92.5 91.6  PLT 72* 353*   Basic Metabolic Panel: Recent Labs  Lab 05/22/20 1836 05/23/20 0437  NA 135 135  K 3.7 3.1*  CL 100 104  CO2 23 23  GLUCOSE 99 111*  BUN 12 12  CREATININE 0.87 0.82  CALCIUM 8.9 7.7*   GFR: Estimated Creatinine Clearance: 101.6 mL/min (by C-G formula based on SCr of 0.82 mg/dL). Liver Function Tests: Recent Labs  Lab 05/22/20 1836  AST 21  ALT 16  ALKPHOS 22*  BILITOT 1.1  PROT 7.4  ALBUMIN 4.5   Recent Labs  Lab 05/22/20 1836  LIPASE 20   No results for input(s): AMMONIA in the last 168 hours. Coagulation Profile: No results for input(s): INR, PROTIME in the last 168 hours. Cardiac Enzymes: No results for input(s): CKTOTAL, CKMB, CKMBINDEX, TROPONINI in the last 168 hours. BNP (last 3 results) No results for input(s): PROBNP in the last 8760 hours. HbA1C: No results for input(s): HGBA1C in the last 72 hours. CBG: No results for input(s): GLUCAP in the last 168 hours. Lipid Profile: No results for input(s): CHOL, HDL, LDLCALC, TRIG, CHOLHDL, LDLDIRECT in the last 72 hours. Thyroid Function Tests:  No results for input(s): TSH, T4TOTAL, FREET4, T3FREE, THYROIDAB in the last 72 hours. Anemia Panel: No results for input(s): VITAMINB12, FOLATE, FERRITIN, TIBC, IRON, RETICCTPCT in the last 72 hours. Sepsis Labs: No results for input(s): PROCALCITON, LATICACIDVEN in the last 168 hours.  Recent Results (from the past 240 hour(s))  Blood culture (routine x 2)     Status: None (Preliminary result)   Collection Time: 05/22/20  8:15 PM   Specimen: BLOOD  Result Value Ref Range Status   Specimen Description BLOOD IV  Final   Special Requests BOTTLES DRAWN AEROBIC AND ANAEROBIC [place]  Final   Culture   Final     NO GROWTH < 12 HOURS Performed at [facility], [address], [place], [place] [address]    Report Status PENDING  Incomplete  Blood culture (routine x 2)     Status: None (Preliminary result)   Collection Time: 05/22/20  8:27 PM   Specimen: BLOOD  Result Value Ref Range Status   Specimen Description BLOOD IV  Final   Special Requests BOTTLES DRAWN AEROBIC AND ANAEROBIC BCAV  Final   Culture   Final    NO GROWTH < 12 HOURS Performed at [facility], [address], [place], [place] [address]    Report Status PENDING  Incomplete  SARS Coronavirus 2 by RT PCR (hospital order, performed in [facility] hospital lab) Nasopharyngeal Nasopharyngeal Swab     Status: None   Collection Time: 05/22/20  9:47 PM   Specimen: Nasopharyngeal Swab  Result Value Ref Range Status   SARS Coronavirus 2 NEGATIVE NEGATIVE Final    Comment: (NOTE) SARS-CoV-2 target nucleic acids are NOT DETECTED.  The SARS-CoV-2 RNA is generally detectable in upper and lower respiratory specimens during the acute phase of infection. The lowest concentration of SARS-CoV-2 viral copies this assay can detect is 250 copies / mL. A negative result does not preclude SARS-CoV-2 infection and should not be used as the sole basis for treatment or other patient management decisions.  A negative result may occur with improper specimen collection / handling, submission of specimen other than nasopharyngeal swab, presence of viral mutation(s) within the areas targeted by this assay, and inadequate number of viral copies (<250 copies / mL). A negative result must be combined with clinical observations, patient history, and epidemiological information.  Fact Sheet for Patients:   StrictlyIdeas.no  Fact Sheet for Healthcare Providers: BankingDealers.co.za  This test is not yet approved or  cleared by the [place] FDA and has been authorized for detection  and/or diagnosis of SARS-CoV-2 by FDA under an Emergency Use Authorization (EUA).  This EUA will remain in effect (meaning this test can be used) for the duration of the COVID-19 declaration under Section 564(b)(1) of the Act, 21 U.S.C. section 360bbb-3(b)(1), unless the authorization is terminated or revoked sooner.  Performed at [facility], [address] [place], [place] [address]          Radiology Studies: CT Renal Stone Study  Result Date: 05/22/2020 CLINICAL DATA:  Right-sided pain EXAM: CT ABDOMEN AND PELVIS WITHOUT CONTRAST TECHNIQUE: Multidetector CT imaging of the abdomen and pelvis was performed following the standard protocol without IV contrast. COMPARISON:  CT 09/08/2018 FINDINGS: Lower chest: No acute abnormality. Hepatobiliary: No focal liver abnormality is seen. No gallstones, gallbladder wall thickening, or biliary dilatation. Pancreas: Unremarkable. No pancreatic ductal dilatation or surrounding inflammatory changes. Spleen: Enlarged, measuring 15 cm craniocaudad. Adrenals/Urinary Tract: Adrenal glands are normal. There is no  hydronephrosis. The bladder is slightly thick walled. There is questionable subtle right perinephric edema. Stomach/Bowel: Stomach is within normal limits. Appendix appears normal. No evidence of bowel wall thickening, distention, or inflammatory changes. Vascular/Lymphatic: No significant vascular findings are present. No enlarged abdominal or pelvic lymph nodes. Reproductive: Status post hysterectomy. No adnexal masses. Other: Negative for free air or free fluid Musculoskeletal: No acute or significant osseous findings. IMPRESSION: 1. Negative for hydronephrosis or ureteral stone. 2. Slightly thick-walled urinary bladder either due to under distension or cystitis. Questionable subtle right perinephric edema, nonspecific though could consider pyelonephritis. Recommend correlation with urinalysis. 3. Splenomegaly. Electronically Signed   By:  Donavan Foil M.D.   On: 05/22/2020 21:04        Scheduled Meds: . enoxaparin (LOVENOX) injection  40 mg Subcutaneous Q24H  . sertraline  50 mg Oral Daily   Continuous Infusions: . sodium chloride 125 mL/hr at 05/23/20 1209  . cefTRIAXone (ROCEPHIN)  IV      Assessment & Plan:   Active Problems:   Acute pyelonephritis  39 year old female with history of anxiety presenting with right-sided flank pain, fever nausea and vomiting    Acute pyelonephritis -Urinalysis shows pyuria.  CT abdomen and pelvis shows some perinephric edema -Continue Rocephin started in the emergency room -Still with fever and severe pain of the right flank area -Continue IV fluids, increased to 125 mL's per hour -Pain management -Follow cultures -Continue with ceftriaxone   Hypokalemia- Will replace. Monitor labs   DVT prophylaxis: Lovenox Code Status: Full code Family Communication: None Disposition Plan: Back home Status is: Inpatient  Remains inpatient appropriate because:Ongoing active pain requiring inpatient pain management   Dispo: The patient is from: Home              Anticipated d/c is to: Home              Anticipated d/c date is: 2 days              Patient currently is not medically stable to d/c.            LOS: 0 days   Time spent: 45 to 50% on Bernice, MD [facility] Pager [phone]  If 7PM-7AM, please contact night-coverage [email or web address] Password [facility] 05/23/2020, 4:42 PM

## 2020-05-24 DIAGNOSIS — R112 Nausea with vomiting, unspecified: Secondary | ICD-10-CM

## 2020-05-24 DIAGNOSIS — A4151 Sepsis due to Escherichia coli [E. coli]: Secondary | ICD-10-CM

## 2020-05-24 DIAGNOSIS — R109 Unspecified abdominal pain: Secondary | ICD-10-CM

## 2020-05-24 LAB — BASIC METABOLIC PANEL
Anion gap: 5 (ref 5–15)
BUN: 8 mg/dL (ref 6–20)
CO2: 24 mmol/L (ref 22–32)
Calcium: 7.6 mg/dL — ABNORMAL LOW (ref 8.9–10.3)
Chloride: 109 mmol/L (ref 98–111)
Creatinine, Ser: 0.79 mg/dL (ref 0.44–1.00)
GFR calc Af Amer: >60 mL/min (ref >60)
GFR calc non Af Amer: >60 mL/min (ref >60)
Glucose, Bld: 112 mg/dL — ABNORMAL HIGH (ref 70–99)
Potassium: 3.3 mmol/L — ABNORMAL LOW (ref 3.5–5.1)
Sodium: 138 mmol/L (ref 135–145)

## 2020-05-24 LAB — CBC
HCT: 33.3 % — ABNORMAL LOW (ref 36.0–46.0)
Hemoglobin: 11.3 g/dL — ABNORMAL LOW (ref 12.0–15.0)
MCH: 31.2 pg (ref 26.0–34.0)
MCHC: 33.9 g/dL (ref 30.0–36.0)
MCV: 92 fL (ref 80.0–100.0)
Platelets: 106 10*3/uL — ABNORMAL LOW (ref 150–400)
RBC: 3.62 MIL/uL — ABNORMAL LOW (ref 3.87–5.11)
RDW: 12.2 % (ref 11.5–15.5)
WBC: 8.5 10*3/uL (ref 4.0–10.5)
nRBC: 0 % (ref 0.0–0.2)

## 2020-05-24 MED ORDER — POTASSIUM CHLORIDE CRYS ER 20 MEQ PO TBCR
40.0000 meq | EXTENDED_RELEASE_TABLET | Freq: Once | ORAL | Status: AC
  Administered 2020-05-24: 40 meq via ORAL
  Filled 2020-05-24: qty 2

## 2020-05-24 MED ORDER — HYDROCODONE-ACETAMINOPHEN 5-325 MG PO TABS: 1.0000 | ORAL_TABLET | ORAL | 0 refills | Status: DC | PRN

## 2020-05-24 MED ORDER — CIPROFLOXACIN HCL 500 MG PO TABS: 500.0000 mg | ORAL_TABLET | Freq: Two times a day (BID) | ORAL | 0 refills | Status: AC

## 2020-05-24 MED ORDER — SODIUM CHLORIDE 0.9 % IV SOLN
1.0000 g | INTRAVENOUS | Status: DC
  Administered 2020-05-24: 1 g via INTRAVENOUS
  Filled 2020-05-24: qty 1

## 2020-05-24 NOTE — Progress Notes (Signed)
05/24/2020 7:26 PM  [name] to be D/C'd Home per MD order.  Discussed prescriptions and follow up appointments with the patient. Prescriptions given to patient, medication list explained in detail. Pt verbalized understanding.  Allergies as of 05/24/2020   No Known Allergies     Medication List    TAKE these medications   ALPRAZolam 0.25 MG tablet Commonly known as: XANAX Take 1 tablet (0.25 mg total) by mouth daily as needed for anxiety. Notes to patient: As needed   ciprofloxacin 500 MG tablet Commonly known as: Cipro Take 1 tablet (500 mg total) by mouth 2 (two) times daily for 8 days. Notes to patient: Morning 05/25/20   HYDROcodone-acetaminophen 5-325 MG tablet Commonly known as: NORCO/VICODIN Take 1-2 tablets by mouth every 4 (four) hours as needed for moderate pain. Notes to patient: As needed   sertraline 50 MG tablet Commonly known as: ZOLOFT Take 1 tablet (50 mg total) by mouth daily. Notes to patient: Morning 05/25/20       Vitals:   05/24/20 1658 05/24/20 1700  BP: 109/74 124/81  Pulse: 69 80  Resp:    Temp:    SpO2:      Skin clean, dry and intact without evidence of skin break down, no evidence of skin tears noted. IV catheter discontinued intact. Site without signs and symptoms of complications. Dressing and pressure applied. Pt denies pain at this time. No complaints noted.  An After Visit Summary was printed and given to the patient. Patient escorted via [place], and D/C home via private auto.  Dola Argyle

## 2020-05-25 LAB — URINE CULTURE: Culture: >=100000 — AB

## 2020-05-26 NOTE — Discharge Summary (Signed)
Physician Discharge Summary  Jessica Peters IOX:[id number] DOB: 1981-11-06 DOA: 05/22/2020  PCP: Valerie Roys, DO  Admit date: 05/22/2020 Discharge date: 05/26/2020  Recommendations for Outpatient Follow-up:  1. Discharge to home. 2. Complete all antibiotics unless told otherwise. 3. Follow up with PCP in 7-10 days. 4. Drink plenty of water.  Discharge Diagnoses: Principal diagnosis is #1 1. Sepsis 2. E. Coli Pyelonephritis 3. Right flank pain 4. Nausea and vomiting 5. Hypokalemia  Discharge Condition: Fair  Disposition: Home  Diet recommendation: Regular  Filed Weights   05/22/20 1831  Weight: 73 kg    History of present illness:   Jessica Peters is a 39 y.o. female with history of anxiety who presents to the ER with a 3-day history of right flank pain that has been progressively no associated with the pain in the right mid back.  Associated with fever and chills.  Initially improved with cranberry juice but now continues to worsen.  Has associated nausea, and vomiting.  Denies change in bowel habits.  Denies cough or shortness of breath ED Course: On arrival patient looked unwell.  She had a temperature of 101.4 and was tachycardic at 111 with otherwise normal vitals.  Blood work showed normal WBC of 9000.  Lactic acid not done from the ER.  Urine showed pyuria.  Lipase negative.  CT renal stone study showed thick-walled urinary bladder as well as subtle right perinephric edema with consideration for pyelonephritis.  Patient started on Rocephin.  Hospitalist consult requested  Hospital Course:  The patient was admitted to a telemetry bed. She was started on IV Rocephin. Blood cultures x 2 were obtained and have had no growth. Urine culture has grown out E. Coli. At the time of my visit and discharge sensitivities were not available. Although mid-day the patient had been slightly tachycardic and hypotensive, by late afternoon the patient's vitals were much improved and she  was feeling a lot better. She demanded discharge to home despite my concerns for possible worsening or return of sepsis on oral antibiotics that would have to be prescribed without benefit of sensitivities. The patient was discharged to home on oral cipro. Today (05/27/2019) I see that her urine culture results have returned and her E. Coli is sensitive to both Rocephin which she received as inpatient and Cipro on which she has been discharged.   Today's assessment: S: The patient is resting quietly upon my visit in the morning. She continued to have moderate right flank pain, and was looking acutely ill.   O: Vitals:  Vitals:   05/24/20 1658 05/24/20 1700  BP: 109/74 124/81  Pulse: 69 80  Resp:    Temp:    SpO2:     Exam:  Constitutional:  . The patient is awake, alert, and oriented x 3. Mild distress from acute illness. Respiratory:  . No increased work of breathing. . No wheezes, rales, or rhonchi . No tactile fremitus Cardiovascular:  . Regular rate and rhythm . No murmurs, ectopy, or gallups. . No lateral PMI. No thrills. Abdomen:  . Abdomen is soft, non-tender, non-distended . No hernias, masses, or organomegaly . Normoactive bowel sounds.  Musculoskeletal:  . No cyanosis, clubbing, or edema Skin:  . No rashes, lesions, ulcers . palpation of skin: no induration or nodules Neurologic:  . CN 2-12 intact . Sensation all 4 extremities intact Psychiatric:  . Mental status o Mood, affect appropriate o Orientation to person, place, time  . judgment and insight appear intact  Discharge Instructions  Discharge Instructions    Activity as tolerated - No restrictions   Complete by: As directed    Call MD for:  difficulty breathing, headache or visual disturbances   Complete by: As directed    Call MD for:  persistant dizziness or light-headedness   Complete by: As directed    Call MD for:  persistant nausea and vomiting   Complete by: As directed    Call MD for:   severe uncontrolled pain   Complete by: As directed    Call MD for:  temperature >100.4   Complete by: As directed    Diet general   Complete by: As directed    Discharge instructions   Complete by: As directed    Discharge to home. Complete all antibiotics unless told otherwise. Follow up with PCP in 7-10 days. Drink plenty of water.   Increase activity slowly   Complete by: As directed      Allergies as of 05/24/2020   No Known Allergies     Medication List    TAKE these medications   ALPRAZolam 0.25 MG tablet Commonly known as: XANAX Take 1 tablet (0.25 mg total) by mouth daily as needed for anxiety. Notes to patient: As needed   ciprofloxacin 500 MG tablet Commonly known as: Cipro Take 1 tablet (500 mg total) by mouth 2 (two) times daily for 8 days. Notes to patient: Morning 05/25/20   HYDROcodone-acetaminophen 5-325 MG tablet Commonly known as: NORCO/VICODIN Take 1-2 tablets by mouth every 4 (four) hours as needed for moderate pain. Notes to patient: As needed   sertraline 50 MG tablet Commonly known as: ZOLOFT Take 1 tablet (50 mg total) by mouth daily. Notes to patient: Morning 05/25/20      No Known Allergies  The results of significant diagnostics from this hospitalization (including imaging, microbiology, ancillary and laboratory) are listed below for reference.    Significant Diagnostic Studies: CT Renal Stone Study  Result Date: 05/22/2020 CLINICAL DATA:  Right-sided pain EXAM: CT ABDOMEN AND PELVIS WITHOUT CONTRAST TECHNIQUE: Multidetector CT imaging of the abdomen and pelvis was performed following the standard protocol without IV contrast. COMPARISON:  CT 09/08/2018 FINDINGS: Lower chest: No acute abnormality. Hepatobiliary: No focal liver abnormality is seen. No gallstones, gallbladder wall thickening, or biliary dilatation. Pancreas: Unremarkable. No pancreatic ductal dilatation or surrounding inflammatory changes. Spleen: Enlarged, measuring 15 cm  craniocaudad. Adrenals/Urinary Tract: Adrenal glands are normal. There is no hydronephrosis. The bladder is slightly thick walled. There is questionable subtle right perinephric edema. Stomach/Bowel: Stomach is within normal limits. Appendix appears normal. No evidence of bowel wall thickening, distention, or inflammatory changes. Vascular/Lymphatic: No significant vascular findings are present. No enlarged abdominal or pelvic lymph nodes. Reproductive: Status post hysterectomy. No adnexal masses. Other: Negative for free air or free fluid Musculoskeletal: No acute or significant osseous findings. IMPRESSION: 1. Negative for hydronephrosis or ureteral stone. 2. Slightly thick-walled urinary bladder either due to under distension or cystitis. Questionable subtle right perinephric edema, nonspecific though could consider pyelonephritis. Recommend correlation with urinalysis. 3. Splenomegaly. Electronically Signed   By: Donavan Foil M.D.   On: 05/22/2020 21:04    Microbiology: Recent Results (from the past 240 hour(s))  Urine culture     Status: Abnormal   Collection Time: 05/22/20  6:38 PM   Specimen: Urine, Clean Catch  Result Value Ref Range Status   Specimen Description   Final    URINE, CLEAN CATCH Performed at [facility], [address] [place], [place] [address]    Special Requests   Final    NONE Performed at [facility], [address] [place], [place] [address]    Culture >=100,000 COLONIES/mL ESCHERICHIA COLI (A)  Final   Report Status 05/25/2020 FINAL  Final   Organism ID, Bacteria ESCHERICHIA COLI (A)  Final      Susceptibility   Escherichia coli - MIC*    AMPICILLIN >=32 RESISTANT Resistant     CEFAZOLIN <=4 SENSITIVE Sensitive     CEFTRIAXONE <=0.25 SENSITIVE Sensitive     CIPROFLOXACIN <=0.25 SENSITIVE Sensitive     GENTAMICIN <=1 SENSITIVE Sensitive     IMIPENEM <=0.25 SENSITIVE Sensitive     NITROFURANTOIN <=16 SENSITIVE Sensitive      TRIMETH/SULFA <=20 SENSITIVE Sensitive     AMPICILLIN/SULBACTAM >=32 RESISTANT Resistant     PIP/TAZO <=4 SENSITIVE Sensitive     * >=100,000 COLONIES/mL ESCHERICHIA COLI  Blood culture (routine x 2)     Status: None (Preliminary result)   Collection Time: 05/22/20  8:15 PM   Specimen: BLOOD  Result Value Ref Range Status   Specimen Description BLOOD IV  Final   Special Requests BOTTLES DRAWN AEROBIC AND ANAEROBIC [place]  Final   Culture   Final    NO GROWTH 4 DAYS Performed at [facility], [address], [place], [place] [address]    Report Status PENDING  Incomplete  Blood culture (routine x 2)     Status: None (Preliminary result)   Collection Time: 05/22/20  8:27 PM   Specimen: BLOOD  Result Value Ref Range Status   Specimen Description BLOOD IV  Final   Special Requests BOTTLES DRAWN AEROBIC AND ANAEROBIC BCAV  Final   Culture   Final    NO GROWTH 4 DAYS Performed at [facility], [address], [place],  [address]    Report Status PENDING  Incomplete  SARS Coronavirus 2 by RT PCR (hospital order, performed in [place] hospital lab) Nasopharyngeal Nasopharyngeal Swab     Status: None   Collection Time: 05/22/20  9:47 PM   Specimen: Nasopharyngeal Swab  Result Value Ref Range Status   SARS Coronavirus 2 NEGATIVE NEGATIVE Final    Comment: (NOTE) SARS-CoV-2 target nucleic acids are NOT DETECTED.  The SARS-CoV-2 RNA is generally detectable in upper and lower respiratory specimens during the acute phase of infection. The lowest concentration of SARS-CoV-2 viral copies this assay can detect is 250 copies / mL. A negative result does not preclude SARS-CoV-2 infection and should not be used as the sole basis for treatment or other patient management decisions.  A negative result may occur with improper specimen collection / handling, submission of specimen other than nasopharyngeal swab, presence of viral mutation(s) within the areas targeted by  this assay, and inadequate number of viral copies (<250 copies / mL). A negative result must be combined with clinical observations, patient history, and epidemiological information.  Fact Sheet for Patients:   StrictlyIdeas.no  Fact Sheet for Healthcare Providers: BankingDealers.co.za  This test is not yet approved or  cleared by the [place] FDA and has been authorized for detection and/or diagnosis of SARS-CoV-2 by FDA under an Emergency Use Authorization (EUA).  This EUA will remain in effect (meaning this test can be used) for the duration of the COVID-19 declaration under Section 564(b)(1) of the Act, 21 U.S.C. section 360bbb-3(b)(1), unless the authorization is terminated or revoked sooner.  Performed at [facility], [address] [place], [place] [address]      Labs: Basic Metabolic Panel: Recent Labs  Lab 05/22/20 1836 05/23/20 0437 05/24/20 0451  NA 135 135 138  K 3.7 3.1* 3.3*  CL 100 104 109  CO2 23 23 24   GLUCOSE 99 111* 112*  BUN 12 12 8   CREATININE 0.87 0.82 0.79  CALCIUM 8.9 7.7* 7.6*   Liver Function Tests: Recent Labs  Lab 05/22/20 1836  AST 21  ALT 16  ALKPHOS 22*  BILITOT 1.1  PROT 7.4  ALBUMIN 4.5   Recent Labs  Lab 05/22/20 1836  LIPASE 20   No results for input(s): AMMONIA in the last 168 hours. CBC: Recent Labs  Lab 05/22/20 1836 05/23/20 0437 05/24/20 0451  WBC 9.2 9.3 8.5  HGB 13.9 12.2 11.3*  HCT 41.7 35.0* 33.3*  MCV 92.5 91.6 92.0  PLT 72* 121* 106*   Cardiac Enzymes: No results for input(s): CKTOTAL, CKMB, CKMBINDEX, TROPONINI in the last 168 hours. BNP: BNP (last 3 results) No results for input(s): BNP in the last 8760 hours.  ProBNP (last 3 results) No results for input(s): PROBNP in the last 8760 hours.  CBG: No results for input(s): GLUCAP in the last 168 hours.  Active Problems:   Acute pyelonephritis   Time coordinating discharge: 38  minutes  Signed:        Avayah Raffety, DO [facility]  05/26/2020, 1:16 PM

## 2020-05-27 LAB — CULTURE, BLOOD (ROUTINE X 2)
Culture: NO GROWTH
Culture: NO GROWTH

## 2020-06-06 ENCOUNTER — Ambulatory Visit: Payer: Self-pay | Admitting: Licensed Clinical Social Worker

## 2020-06-06 DIAGNOSIS — Z599 Problem related to housing and economic circumstances, unspecified: Secondary | ICD-10-CM

## 2020-06-06 NOTE — Chronic Care Management (AMB) (Signed)
  Care Management   Follow Up Note   06/06/2020 Name: Jessica Peters MRN: [id number] DOB: Aug 15, 1981  Referred by: Valerie Roys, DO Reason for referral : No chief complaint on file.   Jessica Peters is a 39 y.o. year old female who is a primary care patient of Valerie Roys, DO. The care management team was consulted for assistance with care management and care coordination needs.    Review of patient status, including review of consultants reports, relevant laboratory and other test results, and collaboration with appropriate care team members and the patient's provider was performed as part of comprehensive patient evaluation and provision of chronic care management services.    LCSW received referral for financial support. LCSW placed patient on schedule for follow up (as first available appointment is not until September) and placed [place] referral on 06/06/20 for financial assistance community resource connection.   Jessica Peters, Jessica Peters, Jessica Peters, Jessica Peters Phone: [phone]

## 2020-06-12 ENCOUNTER — Ambulatory Visit: Payer: Medicaid Other | Admitting: Family Medicine

## 2020-06-26 ENCOUNTER — Telehealth: Payer: Self-pay

## 2020-06-26 NOTE — Telephone Encounter (Signed)
06/26/20 Left a message on voicemail for patient to return my call regarding community resources for housing and utilities. Ambrose Mantle [phone]

## 2020-07-03 NOTE — Telephone Encounter (Signed)
07/03/20 Left a message on voicemail for patient to return my call regarding community resources for housing and utilities. Ambrose Mantle [phone]

## 2020-07-06 NOTE — Telephone Encounter (Signed)
07/06/20 3rd attempt to call patient unable to contact regarding resources for utilities and rent.  Closing referral. Jessica Peters [phone]

## 2020-07-26 ENCOUNTER — Telehealth: Payer: Self-pay | Admitting: Family Medicine

## 2020-07-26 NOTE — Telephone Encounter (Signed)
   SF 07/26/2020   Name: Jessica Peters   MRN: [id number]   DOB: 1981-09-08   AGE: 39 y.o.   GENDER: female   PCP [place] P, DO.   Called pt regarding [name] for financial resources. [name] stated that she currently she does not need any financial assistance. She stated that recently she applied for the [facility] and is waiting to hear back from the organization. She stated she will give the office a call if she has any additional needs.   Closing referral pending any other needs of patient.    [place], Care Management Phone: [phone] Email: [email or web address]

## 2020-07-30 DIAGNOSIS — Z20822 Contact with and (suspected) exposure to covid-19: Secondary | ICD-10-CM | POA: Diagnosis not present

## 2020-08-09 ENCOUNTER — Telehealth: Payer: Self-pay

## 2020-08-09 ENCOUNTER — Telehealth: Payer: Self-pay | Admitting: Licensed Clinical Social Worker

## 2020-08-09 NOTE — Telephone Encounter (Signed)
  Chronic Care Management    Clinical Social Work General Follow Up Note  08/09/2020 Name: Jessica Peters MRN: [id number] DOB: [birth year]  Jessica Peters is a 39 y.o. year old female who is a primary care patient of Valerie Roys, DO. The CCM team was consulted for assistance with [name] .   Review of patient status, including review of consultants reports, relevant laboratory and other test results, and collaboration with appropriate care team members and the patient's provider was performed as part of comprehensive patient evaluation and provision of chronic care management services.    LCSW completed CCM outreach attempt today but was unable to reach patient successfully. A HIPPA compliant voice message was left encouraging patient to return call once available. LCSW will reschedule CCM SW appointment as well.    Outpatient Encounter Medications as of 08/09/2020  Medication Sig  . ALPRAZolam (XANAX) 0.25 MG tablet Take 1 tablet (0.25 mg total) by mouth daily as needed for anxiety.  Marland Kitchen HYDROcodone-acetaminophen (NORCO/VICODIN) 5-325 MG tablet Take 1-2 tablets by mouth every 4 (four) hours as needed for moderate pain.  Marland Kitchen sertraline (ZOLOFT) 50 MG tablet Take 1 tablet (50 mg total) by mouth daily.   No facility-administered encounter medications on file as of 08/09/2020.     Follow Up Plan: SW will reach out to client by phone to follow up regarding patient decision about enrollment in CCM services on:    Jessica Peters, [place], MSW, [email or web address] Phone: [phone]

## 2020-09-05 ENCOUNTER — Encounter: Payer: Self-pay | Admitting: Medical Oncology

## 2020-09-05 ENCOUNTER — Emergency Department
Admission: EM | Admit: 2020-09-05 | Discharge: 2020-09-05 | Disposition: A | Discharge status: Home / Self Care | Payer: Medicaid Other | Attending: Emergency Medicine | Admitting: Emergency Medicine

## 2020-09-05 ENCOUNTER — Other Ambulatory Visit: Payer: Self-pay

## 2020-09-05 DIAGNOSIS — F17210 Nicotine dependence, cigarettes, uncomplicated: Secondary | ICD-10-CM | POA: Diagnosis not present

## 2020-09-05 DIAGNOSIS — M5442 Lumbago with sciatica, left side: Secondary | ICD-10-CM | POA: Insufficient documentation

## 2020-09-05 DIAGNOSIS — M545 Low back pain, unspecified: Secondary | ICD-10-CM | POA: Diagnosis present

## 2020-09-05 MED ORDER — METHOCARBAMOL 500 MG PO TABS: 500.0000 mg | ORAL_TABLET | Freq: Four times a day (QID) | ORAL | 0 refills | Status: DC

## 2020-09-05 MED ORDER — PREDNISONE 10 MG PO TABS: ORAL_TABLET | ORAL | 0 refills | Status: DC

## 2020-09-05 MED ORDER — HYDROCODONE-ACETAMINOPHEN 5-325 MG PO TABS
1.0000 | ORAL_TABLET | Freq: Once | ORAL | Status: AC
  Administered 2020-09-05: 1 via ORAL
  Filled 2020-09-05: qty 1

## 2020-09-05 MED ORDER — METHOCARBAMOL 500 MG PO TABS
750.0000 mg | ORAL_TABLET | Freq: Once | ORAL | Status: AC
  Administered 2020-09-05: 750 mg via ORAL
  Filled 2020-09-05: qty 2

## 2020-09-05 MED ORDER — KETOROLAC TROMETHAMINE 30 MG/ML IJ SOLN
30.0000 mg | Freq: Once | INTRAMUSCULAR | Status: AC
  Administered 2020-09-05: 30 mg via INTRAMUSCULAR
  Filled 2020-09-05: qty 1

## 2020-09-05 MED ORDER — HYDROCODONE-ACETAMINOPHEN 5-325 MG PO TABS: 1.0000 | ORAL_TABLET | ORAL | 0 refills | Status: DC | PRN

## 2020-09-05 NOTE — ED Provider Notes (Signed)
[facility] Emergency Department Provider Note   ____________________________________________   First MD Initiated Contact with Patient 09/05/20 1108     (approximate)  I have reviewed the triage vital signs and the nursing notes.   HISTORY  Chief Complaint Back Pain   HPI Jessica Peters is a 39 y.o. female presents to the ED with complaint of low back pain yesterday without history of injury.  Patient states that she has pain radiating down her left leg.  She has taken ibuprofen, lidocaine patch and used heat at home without any help or relief.  Patient denies any previous injury, loss of bowel or bladder function or paresthesias.  Pain is worse with walking.  Patient did drive to the emergency department but states that family member is coming to get her.  She rates her pain as 9 out of 10.      Past Medical History:  Diagnosis Date  . Allergy   . Anemia   . Anxiety   . BRCA negative 02/2018   MyRisk neg  . Endometriosis   . Family history of breast cancer 02/2018   My Risk neg  . Headache    h/o migraines  . Increased risk of breast cancer 02/2018   IBIS=18.8%/riskscore=26.3%  . Ovarian cyst   . Ovarian cyst   . PTSD (post-traumatic stress disorder)   . PTSD (post-traumatic stress disorder)   . Thrombocytopenia ([place])   . Vitamin D deficiency     Patient Active Problem List   Diagnosis Date Noted  . Acute pyelonephritis 05/22/2020  . Status post laparoscopic hysterectomy 12/10/2018  . Chronic pelvic pain in female 12/04/2018  . Right lower quadrant abdominal pain 03/16/2018  . Allergic rhinitis 10/03/2017  . Depression, recurrent ([place]) 10/03/2017  . Anxiety 06/13/2017  . Vitamin D deficiency   . Family history of breast cancer   . Breast lump on right side at 1 o'clock position 02/03/2017    Past Surgical History:  Procedure Laterality Date  . CYSTOSCOPY Bilateral 12/10/2018   Procedure: CYSTOSCOPY;  Surgeon: Will Bonnet, MD;  Location: [facility] ORS;  Service: Gynecology;  Laterality: Bilateral;  . DILITATION & CURRETTAGE/HYSTROSCOPY WITH NOVASURE ABLATION  12/07/2015   Procedure: DILATATION & CURETTAGE/HYSTEROSCOPY WITH NOVASURE ABLATION;  Surgeon: Will Bonnet, MD;  Location: [facility] ORS;  Service: Gynecology;;  . LAPAROSCOPIC HYSTERECTOMY Bilateral 12/10/2018   Procedure: HYSTERECTOMY TOTAL LAPAROSCOPIC BILATERAL SALPRINGETOMY;  Surgeon: Will Bonnet, MD;  Location: [facility] ORS;  Service: Gynecology;  Laterality: Bilateral;  . MOUTH SURGERY    . TONSILLECTOMY    . TUBAL LIGATION      Prior to Admission medications   Medication Sig Start Date End Date Taking? Authorizing Provider  ALPRAZolam (XANAX) 0.25 MG tablet Take 1 tablet (0.25 mg total) by mouth daily as needed for anxiety. 05/18/20   Volney American, PA-C  HYDROcodone-acetaminophen (NORCO/VICODIN) 5-325 MG tablet Take 1 tablet by mouth every 4 (four) hours as needed for moderate pain. 09/05/20   Johnn Hai, PA-C  methocarbamol (ROBAXIN) 500 MG tablet Take 1 tablet (500 mg total) by mouth 4 (four) times daily. 09/05/20   Johnn Hai, PA-C  predniSONE (DELTASONE) 10 MG tablet Take 6 tablets  today, on day 2 take 5 tablets, day 3 take 4 tablets, day 4 take 3 tablets, day 5 take  2 tablets and 1 tablet the last day 09/05/20   Johnn Hai, PA-C  sertraline (ZOLOFT) 50 MG tablet Take 1 tablet (  50 mg total) by mouth daily. 05/18/20   Volney American, PA-C    Allergies Patient has no known allergies.  Family History  Problem Relation Age of Onset  . Breast cancer Paternal Aunt 71  . Cancer Paternal Aunt        Breast  . Breast cancer Paternal Grandmother 42  . Stroke Paternal Grandmother   . Clotting disorder Paternal Grandmother   . Cancer Paternal Grandmother        Breast  . Depression Mother   . Depression Sister   . Alcohol abuse Maternal Grandmother   . Stroke Maternal Grandmother   . Depression Maternal  Grandmother   . Cancer Maternal Grandmother        Liver  . Alcohol abuse Maternal Grandfather   . Stroke Maternal Grandfather   . Cancer Maternal Grandfather        Liver  . Stroke Paternal Grandfather   . Heart disease Paternal Grandfather     Social History Social History   Tobacco Use  . Smoking status: Current Every Day Smoker    Packs/day: 0.50    Years: 13.00    Pack years: 6.50    Types: Cigarettes  . Smokeless tobacco: Never Used  Vaping Use  . Vaping Use: Never used  Substance Use Topics  . Alcohol use: Yes    Comment: socially  . Drug use: No    Review of Systems Constitutional: No fever/chills Eyes: No visual changes. Cardiovascular: Denies chest pain. Respiratory: Denies shortness of breath. Gastrointestinal: No abdominal pain.  No nausea, no vomiting.   Genitourinary: Negative for dysuria.  Status post hysterectomy. Musculoskeletal: Positive for low back pain with left leg radiculopathy. Skin: Negative for rash. Neurological: Negative for headaches, focal weakness.  ____________________________________________   PHYSICAL EXAM:  VITAL SIGNS: ED Triage Vitals  Enc Vitals Group     BP 09/05/20 0914 (!) 142/92     Pulse Rate 09/05/20 0914 94     Resp 09/05/20 0914 18     Temp 09/05/20 0914 97.9 F (36.6 C)     Temp Source 09/05/20 0914 Oral     SpO2 09/05/20 0914 100 %     Weight 09/05/20 0915 160 lb (72.6 kg)     Height 09/05/20 0915 _0  (1.778 m)     Head Circumference --      Peak Flow --      Pain Score 09/05/20 0915 9     Pain Loc --      Pain Edu? --      Excl. in [place]? --     Constitutional: Alert and oriented. Well appearing and in no acute distress. Eyes: Conjunctivae are normal.  Head: Atraumatic. Neck: No stridor.   Cardiovascular: Normal rate, regular rhythm. Grossly normal heart sounds.  Good peripheral circulation. Respiratory: Normal respiratory effort.  No retractions. Lungs CTAB. Gastrointestinal: Soft and nontender.  No distention.  Musculoskeletal: Nontender thoracic or cervical spine.  There is tenderness on palpation of L5-S1 and sacral area and left paravertebral muscles.  Range of motion is restricted secondary to patient's increased pain with movement.  Straight leg raises are negative.  Good muscle strength bilaterally. Neurologic:  Normal speech and language.  Reflexes are 2+ bilaterally.  No gross focal neurologic deficits are appreciated.  Skin:  Skin is warm, dry and intact. No rash noted. Psychiatric: Mood and affect are normal. Speech and behavior are normal.  ____________________________________________   LABS (all labs ordered are listed, but only abnormal  results are displayed)  Labs Reviewed - No data to display   PROCEDURES  Procedure(s) performed (including Critical Care):  Procedures   ____________________________________________   INITIAL IMPRESSION / ASSESSMENT AND PLAN   As part of my medical decision making, I reviewed the following data within the electronic MEDICAL RECORD NUMBER Notes from prior ED visits and  Controlled Substance Database  39 year old female presents to the ED with complaint of low back pain that began yesterday and is unrelieved by ibuprofen and a lot cane patch.  Patient denies any recent injury.  She also reports that she has some pain into her left leg.  She denies any symptoms suspicious for cauda equina.  Toradol 30 mg IM, methocarbamol 750 and hydrocodone was given while in the emergency department.  Patient was getting relief at the time of discharge and able to move somewhat with less pain.  Patient is aware that we are treating her for sciatica at this time.  She is to follow-up with her PCP if any continued problems.  She was discharged with a prescription for prednisone tapering dose pack, methocarbamol and hydrocodone.  She is to follow-up with her PCP if any continued problems or return to the emergency department if any severe worsening of her  symptoms such as incontinence of bowel or bladder.  ____________________________________________   FINAL CLINICAL IMPRESSION(S) / ED DIAGNOSES  Final diagnoses:  Acute bilateral low back pain with left-sided sciatica     ED Discharge Orders         Ordered    HYDROcodone-acetaminophen (NORCO/VICODIN) 5-325 MG tablet  Every 4 hours PRN        09/05/20 1257    methocarbamol (ROBAXIN) 500 MG tablet  4 times daily        09/05/20 1257    predniSONE (DELTASONE) 10 MG tablet        09/05/20 1257          *Please note:  YURIDIA COUTS was evaluated in Emergency Department on 09/05/2020 for the symptoms described in the history of present illness. She was evaluated in the context of the global COVID-19 pandemic, which necessitated consideration that the patient might be at risk for infection with the SARS-CoV-2 virus that causes COVID-19. Institutional protocols and algorithms that pertain to the evaluation of patients at risk for COVID-19 are in a state of rapid change based on information released by regulatory bodies including the CDC and federal and state organizations. These policies and algorithms were followed during the patient's care in the ED.  Some ED evaluations and interventions may be delayed as a result of limited staffing during and the pandemic.*   Note:  This document was prepared using Dragon voice recognition software and may include unintentional dictation errors.    Johnn Hai, PA-C 09/05/20 1348    Arta Silence, MD 09/05/20 1441

## 2020-09-05 NOTE — ED Triage Notes (Signed)
Pt reports yesterday she began having lower back pain/ bilt sciatica pain. Pt reports ibuprofen, lidocaine patch and heat at home has not helped with pain.

## 2020-09-05 NOTE — Discharge Instructions (Signed)
Follow-up with your primary care provider if any continued problems or not improving.  You may use ice or heat to your back as needed for discomfort.  The medications that were given to you in the emergency department could cause drowsiness and increase your risk for falling.  Also prescriptions for hydrocodone, methocarbamol and prednisone was sent to your pharmacy.  Be aware that the hydrocodone and methocarbamol can cause drowsiness and increase your risk for falling.  Do not drive or operate machinery while taking that medication.  If any severe worsening of your symptoms such as loss of bowel or bladder control return to the emergency department.

## 2020-09-05 NOTE — ED Notes (Signed)
See triage note, pt reports "extreme back pain" around tailbone that started yesterday.  Denies injury, loss of bowel or bladder function.  Pain worsens with walking.  Minimal relief with ibuprofen.  Pt appears uncomfortable in treatment room.

## 2020-09-06 ENCOUNTER — Telehealth: Payer: Self-pay

## 2020-09-06 NOTE — Telephone Encounter (Signed)
Transition Care Management Unsuccessful Follow-up Telephone Call  Date of discharge and from where:  09/05/2020 from [facility]   Attempts:  1st Attempt  Reason for unsuccessful TCM follow-up call:  Left voice message

## 2020-09-07 NOTE — Telephone Encounter (Signed)
Transition Care Management Unsuccessful Follow-up Telephone Call  Date of discharge and from where:  09/05/2020 from [facility]  Attempts:  2nd Attempt  Reason for unsuccessful TCM follow-up call:  Left voice message

## 2020-09-08 NOTE — Telephone Encounter (Signed)
Transition Care Management Unsuccessful Follow-up Telephone Call  Date of discharge and from where:  09/05/2020 from [facility]   Attempts:  3rd Attempt  Reason for unsuccessful TCM follow-up call:  Left voice message

## 2020-09-20 ENCOUNTER — Ambulatory Visit: Payer: Medicaid Other | Admitting: Family Medicine

## 2020-10-17 ENCOUNTER — Encounter: Payer: Self-pay | Admitting: Family Medicine

## 2020-10-18 ENCOUNTER — Telehealth: Payer: Self-pay

## 2020-11-13 ENCOUNTER — Telehealth: Payer: Self-pay

## 2020-12-04 ENCOUNTER — Telehealth: Payer: Self-pay | Admitting: Licensed Clinical Social Worker

## 2020-12-04 ENCOUNTER — Telehealth: Payer: Self-pay

## 2020-12-04 NOTE — Telephone Encounter (Signed)
  Chronic Care Management    Clinical Social Work General Follow Up Note  12/04/2020 Name: MESHIA RAU MRN: [id number] DOB: [birth year]  [name] is a 40 y.o. year old female who is a primary care patient of Valerie Roys, DO. The CCM team was consulted for assistance with [name] .   Review of patient status, including review of consultants reports, relevant laboratory and other test results, and collaboration with appropriate care team members and the patient's provider was performed as part of comprehensive patient evaluation and provision of chronic care management services.    LCSW completed second CCM outreach attempt today but was unable to reach patient successfully. A HIPPA compliant voice message was left encouraging patient to return call once available. LCSW will ask Scheduling Care Guide to reschedule CCM SW appointment with patient as well.   Outpatient Encounter Medications as of 12/04/2020  Medication Sig  . ALPRAZolam (XANAX) 0.25 MG tablet Take 1 tablet (0.25 mg total) by mouth daily as needed for anxiety.  Marland Kitchen HYDROcodone-acetaminophen (NORCO/VICODIN) 5-325 MG tablet Take 1 tablet by mouth every 4 (four) hours as needed for moderate pain.  . methocarbamol (ROBAXIN) 500 MG tablet Take 1 tablet (500 mg total) by mouth 4 (four) times daily.  . predniSONE (DELTASONE) 10 MG tablet Take 6 tablets  today, on day 2 take 5 tablets, day 3 take 4 tablets, day 4 take 3 tablets, day 5 take  2 tablets and 1 tablet the last day  . sertraline (ZOLOFT) 50 MG tablet Take 1 tablet (50 mg total) by mouth daily.   No facility-administered encounter medications on file as of 12/04/2020.    Follow Up Plan: [facility] will reach out to patient to reschedule appointment.   Eula Fried, BSW, MSW, [facility] Care Management [email or web address] Phone: [phone]

## 2020-12-06 ENCOUNTER — Telehealth: Payer: Self-pay

## 2020-12-06 NOTE — Chronic Care Management (AMB) (Signed)
  Care Management   Note  12/06/2020 Name: Jessica Peters MRN: [id number] DOB: [birth year]  Jessica Peters is a 40 y.o. year old female who is a primary care patient of Valerie Roys, DO and is actively engaged with the care management team. I reached out to Jessica Peters by phone today to assist with re-scheduling a follow up visit with the Licensed Clinical Social Worker  Follow up plan: Unsuccessful telephone outreach attempt made. A HIPAA compliant phone message was left for the patient providing contact information and requesting a return call.  The care management team will reach out to the patient again over the next 7 days.  If patient returns call to provider office, please advise to call [facility]  at [place], [place], [place], [place] [address] Direct Dial: [phone] [email or web address]

## 2020-12-12 NOTE — Chronic Care Management (AMB) (Signed)
  Care Management   Note  12/12/2020 Name: [name] MRN: [id number] DOB: 1980/12/29  Jessica Peters is a 40 y.o. year old female who is a primary care patient of Valerie Roys, DO and is actively engaged with the care management team. I reached out to Jessica Peters by phone today to assist with re-scheduling a follow up visit with the Licensed Clinical Social Worker  Follow up plan: Patient declines further follow up and engagement by the care management team. Appropriate care team members and provider have been notified via electronic communication.   Noreene Larsson, [place], [place], [place] [address] Direct Dial: [phone] [email or web address]: [email or web address]

## 2020-12-12 NOTE — Telephone Encounter (Signed)
Spoke with Patient and she declines to reschedule and states that she will call if she changes her mind

## 2020-12-23 ENCOUNTER — Other Ambulatory Visit: Payer: Self-pay | Admitting: Emergency Medicine

## 2020-12-23 ENCOUNTER — Other Ambulatory Visit: Payer: Self-pay

## 2020-12-25 NOTE — Telephone Encounter (Signed)
Pt scheduled for 12/26/2020

## 2020-12-25 NOTE — Telephone Encounter (Signed)
FYI

## 2020-12-25 NOTE — Telephone Encounter (Signed)
Needs follow up visit

## 2020-12-26 ENCOUNTER — Ambulatory Visit: Payer: Medicaid Other | Admitting: Family Medicine

## 2020-12-26 ENCOUNTER — Other Ambulatory Visit: Payer: Self-pay

## 2020-12-26 ENCOUNTER — Encounter: Payer: Self-pay | Admitting: Family Medicine

## 2020-12-26 VITALS — BP 117/74 | HR 90 | Temp 98.2°F | Wt 165.6 lb

## 2020-12-26 DIAGNOSIS — R0989 Other specified symptoms and signs involving the circulatory and respiratory systems: Secondary | ICD-10-CM | POA: Diagnosis not present

## 2020-12-26 DIAGNOSIS — F339 Major depressive disorder, recurrent, unspecified: Secondary | ICD-10-CM | POA: Diagnosis not present

## 2020-12-26 MED ORDER — ALPRAZOLAM 0.25 MG PO TABS: 0.2500 mg | ORAL_TABLET | Freq: Every day | ORAL | 0 refills | Status: DC | PRN

## 2020-12-26 MED ORDER — SERTRALINE HCL 50 MG PO TABS
50.0000 mg | ORAL_TABLET | Freq: Every day | ORAL | 0 refills | Status: DC
Start: 2020-12-26 — End: 2021-09-26

## 2020-12-26 NOTE — Assessment & Plan Note (Addendum)
Axillary. Likely reactive given symptoms and duration. Advised to return for care if enlarges, fevers, chills, or persists >1 week.

## 2020-12-26 NOTE — Progress Notes (Signed)
   SUBJECTIVE:   CHIEF COMPLAINT / HPI:   Patient Active Problem List   Diagnosis Date Noted  . Lymph node symptom 12/26/2020  . Acute pyelonephritis 05/22/2020  . Status post laparoscopic hysterectomy 12/10/2018  . Chronic pelvic pain in female 12/04/2018  . Right lower quadrant abdominal pain 03/16/2018  . Allergic rhinitis 10/03/2017  . Depression, recurrent ([place]) 10/03/2017  . Anxiety 06/13/2017  . Vitamin D deficiency   . Family history of breast cancer   . Breast lump on right side at 1 o'clock position 02/03/2017   Anxiety/Depression - Medications: zoloft 50mg  (1 month supply in June 2021), xanax 0.25mg  daily prn - previously on wellbutrin, didn't tolerate.  - Taking: nothing. Used last xanax few days ago (rx in June) - Counseling: no - Previous hospitalizations: no - FH of psych illness: mom with bipolar and sister with ?multiple personality. - Current stressors: everything. Housing situation, teen children, financial difficulties - Coping Mechanisms: walks in woods, exercises  Depression screen [facility] 2/9 12/26/2020 05/18/2020 05/04/2019  Decreased Interest 2 2 0  Down, Depressed, Hopeless 2 2 0  PHQ - 2 Score 4 4 0  Altered sleeping 3 3 2   Tired, decreased energy 2 2 2   Change in appetite 2 3 0  Feeling bad or failure about yourself  2 1 0  Trouble concentrating 2 1 0  Moving slowly or fidgety/restless 2 1 0  Suicidal thoughts 0 0 0  PHQ-9 Score 17 15 4   Difficult doing work/chores - - Not difficult at all   GAD 7 : Generalized Anxiety Score 12/26/2020 05/18/2020 05/04/2019 10/20/2018  Nervous, Anxious, on Edge 3 2 2 1   Control/stop worrying 3 2 0 0  Worry too much - different things 3 2 0 0  Trouble relaxing 3 3 0 0  Restless 3 3 0 0  Easily annoyed or irritable 1 1 1  0  Afraid - awful might happen 0 2 0 0  Total GAD 7 Score 16 15 3 1   Anxiety Difficulty Extremely difficult Very difficult Not difficult at all Not difficult at all    Bump in armpit - Sore to touch.  Under R armpit. Yesterday noticed it. No skin changes. No recent vaccines or illnesses.  OBJECTIVE:   BP 117/74   Pulse 90   Temp 98.2 F (36.8 C) (Oral)   Wt 165 lb 9.6 oz (75.1 kg)   LMP 11/29/2015 (Exact Date)   SpO2 97%   BMI 23.76 kg/m    Gen: well appearing Skin: small axillary lymph node, TTP, without overlying skin changes. Psych: tearful  ASSESSMENT/PLAN:   Depression, recurrent ([place]) Uncontrolled and exacerbated by housing and financial difficulties. Will restart zoloft and provide xanax refill until able to stabilize, previously with appropriate use. Referral generated for counseling. Coping mechanisms reviewed. F/u in 4 weeks.   Lymph node symptom Axillary. Likely reactive given symptoms and duration. Advised to return for care if enlarges, fevers, chills, or persists >1 week.     Myles Gip, DO

## 2020-12-26 NOTE — Assessment & Plan Note (Signed)
Uncontrolled and exacerbated by housing and financial difficulties. Will restart zoloft and provide xanax refill until able to stabilize, previously with appropriate use. Referral generated for counseling. Coping mechanisms reviewed. F/u in 4 weeks.

## 2020-12-26 NOTE — Patient Instructions (Signed)
It was great to see you!  Our plans for today:  - We are restarting zoloft. Let us know if you don't tolerate this. - Someone will be contacting you about counseling. - Let us know if your lymph node gets worse, does not go away in about a week or if you develop fevers, chills.  Take care and seek immediate care sooner if you develop any concerns.   Dr. Ky Barban

## 2020-12-27 ENCOUNTER — Telehealth: Payer: Self-pay

## 2020-12-27 NOTE — Chronic Care Management (AMB) (Signed)
  Care Management   Outreach Note  12/27/2020 Name: Jessica Peters MRN: [id number] DOB: 04/29/1981  Referred by: Valerie Roys, DO Reason for referral : Care Coordination (Outreach to schedule referral for LCSW )   An unsuccessful telephone outreach was attempted today. The patient was referred to the case management team for assistance with care management and care coordination.   Follow Up Plan: A HIPAA compliant phone message was left for the patient providing contact information and requesting a return call.  The care management team will reach out to the patient again over the next 7 days.  If patient returns call to provider office, please advise to call [facility] at [place], [place], [place], [place] [address] Direct Dial: [phone] [email or web address]

## 2020-12-29 ENCOUNTER — Encounter: Payer: Self-pay | Admitting: Family Medicine

## 2021-01-01 NOTE — Chronic Care Management (AMB) (Signed)
  Chronic Care Management   Note  01/01/2021 Name: Jessica Peters MRN: [id number] DOB: Apr 07, 1981  Jessica Peters is a 40 y.o. year old female who is a primary care patient of Valerie Roys, DO. Jessica Peters is currently enrolled in care management services. An additional referral for LCSW was placed.   Follow up plan: Telephone appointment with care management team member scheduled for:01/08/2021  Noreene Larsson, [place], [place], [place] [address] Direct Dial: [phone] [email or web address]: [email or web address]

## 2021-01-04 ENCOUNTER — Other Ambulatory Visit: Payer: Self-pay

## 2021-01-04 ENCOUNTER — Emergency Department: Payer: Medicaid Other

## 2021-01-04 ENCOUNTER — Emergency Department
Admission: EM | Admit: 2021-01-04 | Discharge: 2021-01-04 | Disposition: A | Discharge status: Home / Self Care | Payer: Medicaid Other | Attending: Student in an Organized Health Care Education/Training Program | Admitting: Student in an Organized Health Care Education/Training Program

## 2021-01-04 DIAGNOSIS — R079 Chest pain, unspecified: Secondary | ICD-10-CM | POA: Diagnosis not present

## 2021-01-04 DIAGNOSIS — R0789 Other chest pain: Secondary | ICD-10-CM | POA: Insufficient documentation

## 2021-01-04 DIAGNOSIS — F17210 Nicotine dependence, cigarettes, uncomplicated: Secondary | ICD-10-CM | POA: Insufficient documentation

## 2021-01-04 DIAGNOSIS — Z8616 Personal history of COVID-19: Secondary | ICD-10-CM | POA: Insufficient documentation

## 2021-01-04 DIAGNOSIS — U071 COVID-19: Secondary | ICD-10-CM | POA: Diagnosis not present

## 2021-01-04 DIAGNOSIS — J9811 Atelectasis: Secondary | ICD-10-CM | POA: Diagnosis not present

## 2021-01-04 LAB — CBC WITH DIFFERENTIAL/PLATELET
Abs Immature Granulocytes: 0.02 10*3/uL (ref 0.00–0.07)
Basophils Absolute: 0 10*3/uL (ref 0.0–0.1)
Basophils Relative: 0 %
Eosinophils Absolute: 0.1 10*3/uL (ref 0.0–0.5)
Eosinophils Relative: 2 %
HCT: 43.7 % (ref 36.0–46.0)
Hemoglobin: 14.5 g/dL (ref 12.0–15.0)
Immature Granulocytes: 0 %
Lymphocytes Relative: 38 %
Lymphs Abs: 1.7 10*3/uL (ref 0.7–4.0)
MCH: 30 pg (ref 26.0–34.0)
MCHC: 33.2 g/dL (ref 30.0–36.0)
MCV: 90.5 fL (ref 80.0–100.0)
Monocytes Absolute: 0.3 10*3/uL (ref 0.1–1.0)
Monocytes Relative: 6 %
Neutro Abs: 2.4 10*3/uL (ref 1.7–7.7)
Neutrophils Relative %: 54 %
Platelets: 146 10*3/uL — ABNORMAL LOW (ref 150–400)
RBC: 4.83 MIL/uL (ref 3.87–5.11)
RDW: 11.7 % (ref 11.5–15.5)
WBC: 4.6 10*3/uL (ref 4.0–10.5)
nRBC: 0 % (ref 0.0–0.2)

## 2021-01-04 LAB — COMPREHENSIVE METABOLIC PANEL
ALT: 14 U/L (ref 0–44)
AST: 17 U/L (ref 15–41)
Albumin: 4.4 g/dL (ref 3.5–5.0)
Alkaline Phosphatase: 17 U/L — ABNORMAL LOW (ref 38–126)
Anion gap: 12 (ref 5–15)
BUN: 9 mg/dL (ref 6–20)
CO2: 24 mmol/L (ref 22–32)
Calcium: 9.2 mg/dL (ref 8.9–10.3)
Chloride: 102 mmol/L (ref 98–111)
Creatinine, Ser: 0.67 mg/dL (ref 0.44–1.00)
GFR, Estimated: >60 mL/min (ref >60)
Glucose, Bld: 101 mg/dL — ABNORMAL HIGH (ref 70–99)
Potassium: 3.7 mmol/L (ref 3.5–5.1)
Sodium: 138 mmol/L (ref 135–145)
Total Bilirubin: 0.6 mg/dL (ref 0.3–1.2)
Total Protein: 7.1 g/dL (ref 6.5–8.1)

## 2021-01-04 LAB — LIPASE, BLOOD: Lipase: 28 U/L (ref 11–51)

## 2021-01-04 LAB — TROPONIN I (HIGH SENSITIVITY): Troponin I (High Sensitivity): <2 ng/L (ref <18)

## 2021-01-04 IMAGING — CT CT ANGIO CHEST
2 of 6 series · 18 of 46 positions shown · IV contrast (APPLIED)
Comparison: Chest radiograph [DATE]

CLINICAL DATA: Chest pain.  Recent [P3] positive

EXAM:
CT ANGIOGRAPHY CHEST WITH CONTRAST
TECHNIQUE: Multidetector CT imaging of the chest was performed using the
standard protocol during bolus administration of intravenous
contrast. Multiplanar CT image reconstructions and MIPs were
obtained to evaluate the vascular anatomy.
CONTRAST:  75mL OMNIPAQUE IOHEXOL 350 MG/ML SOLN

[Series 5: thins · axial · 0.61mm/px · z∈[-362,-115]mm · 15 of 271 slices shown]
[im 12/271  lung]
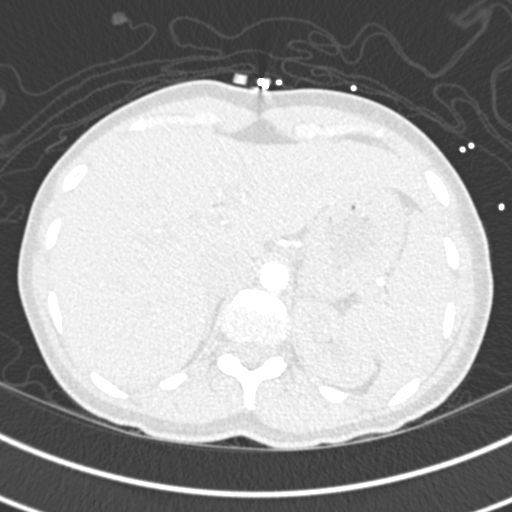
[im 36/271  soft-tissue]
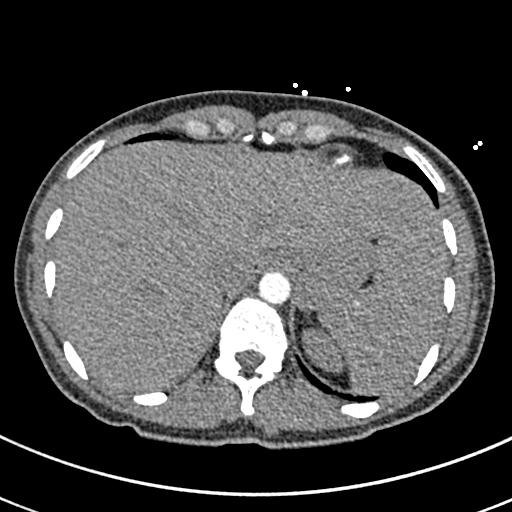
[im 47/271  lung]
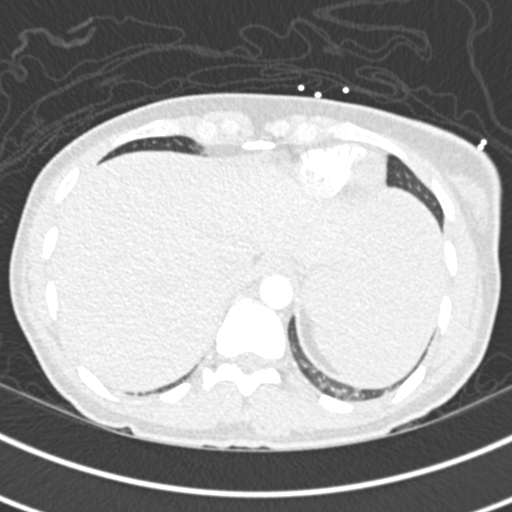
[im 71/271  soft-tissue]
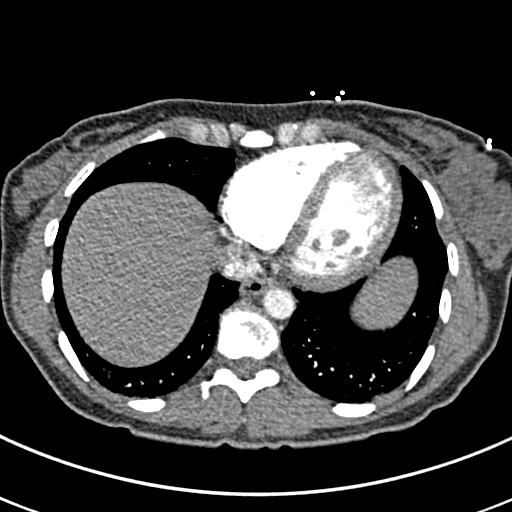
[im 83/271  lung]
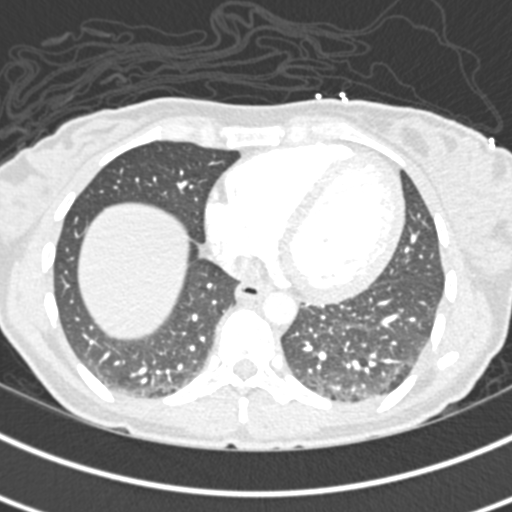
[im 106/271  soft-tissue]
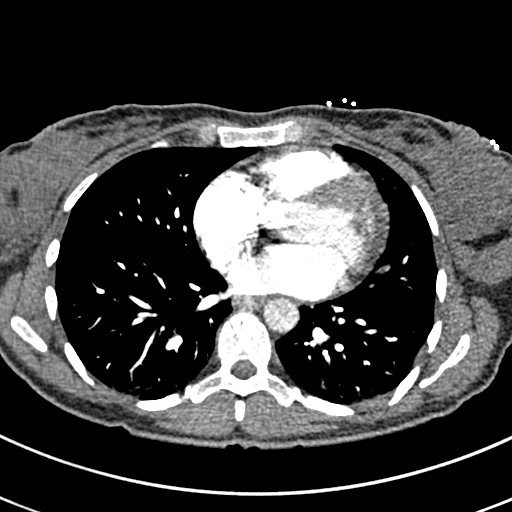
[im 118/271  lung]
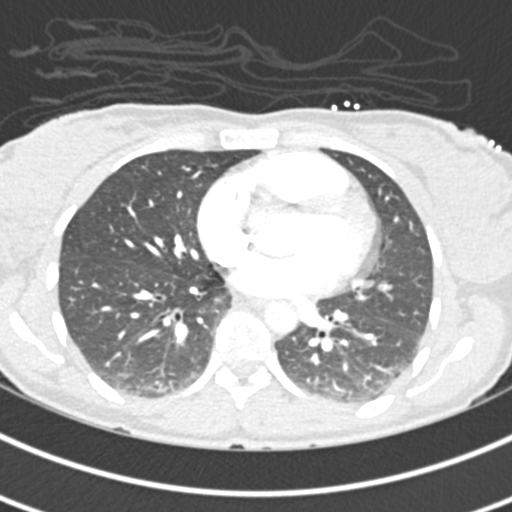
[im 141/271  soft-tissue]
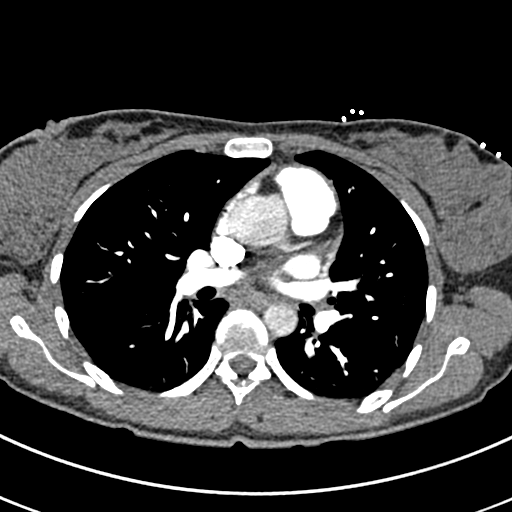
[im 153/271  lung]
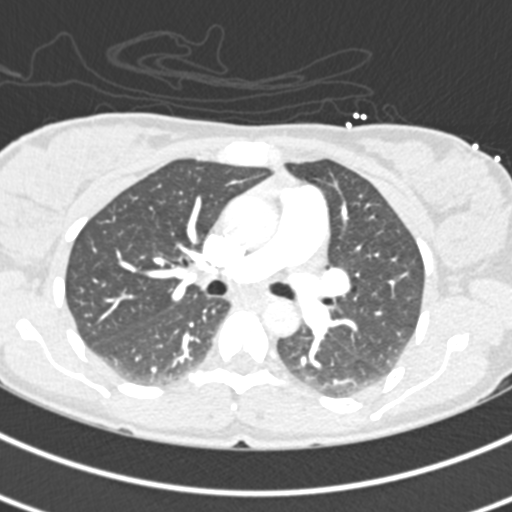
[im 165/271  soft-tissue]
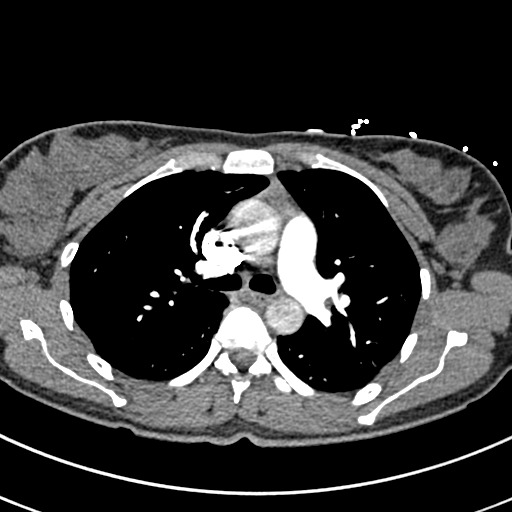
[im 188/271  lung]
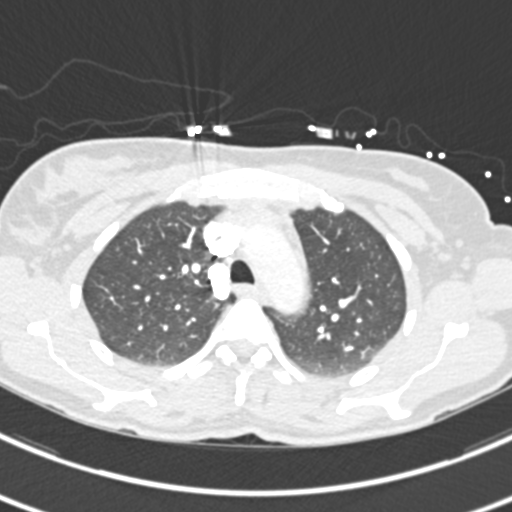
[im 200/271  soft-tissue]
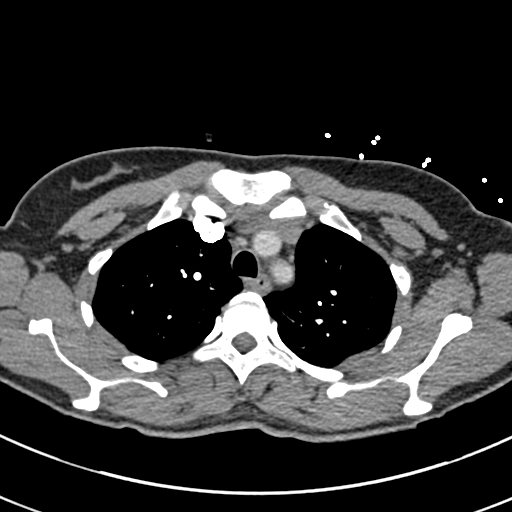
[im 224/271  lung]
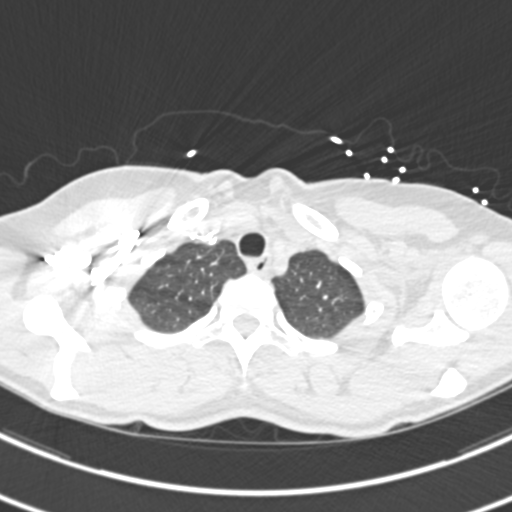
[im 235/271  soft-tissue]
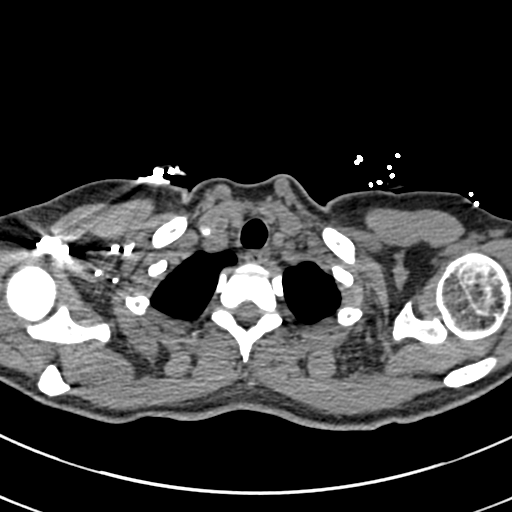
[im 259/271  lung]
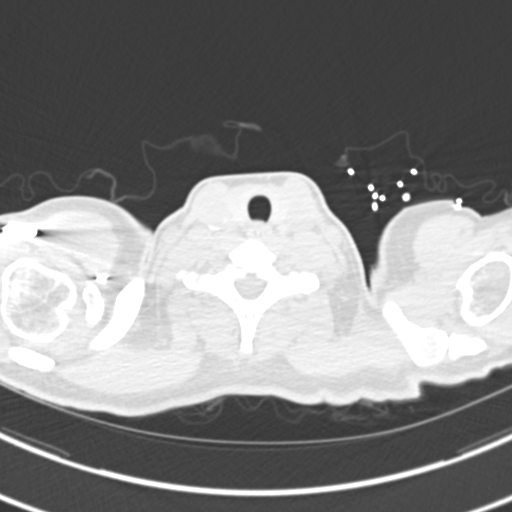

[Series 7: coronal mpr · coronal · 0.53mm/px · 3 of 74 slices shown]
[im 19/74  soft-tissue]
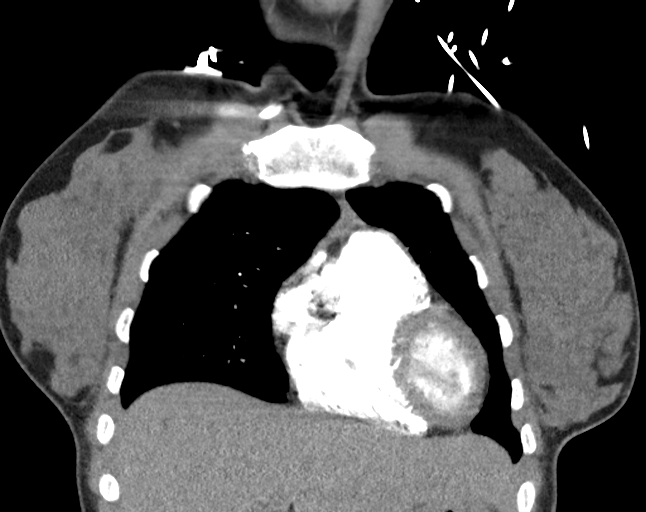
[im 37/74  soft-tissue]
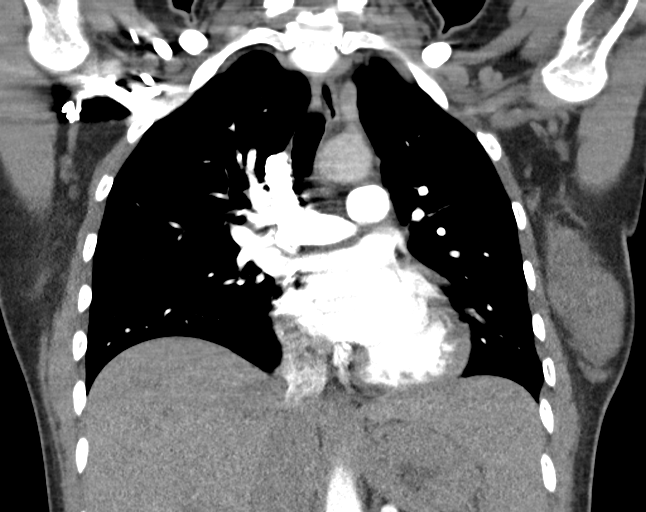
[im 55/74  soft-tissue]
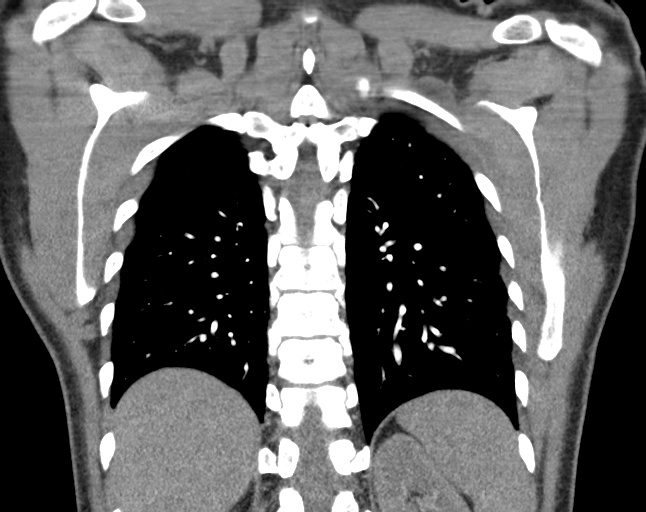

[18 of 46 positions shown; findings below may reference images not displayed]

FINDINGS: Cardiovascular: There is no demonstrable pulmonary embolus. There is
no appreciable thoracic aortic aneurysm or dissection. Visualized
great vessels appear unremarkable. Right innominate and left common
carotid arteries arise as a common trunk, an anatomic variant. There
is no pericardial effusion or pericardial thickening.

Mediastinum/Nodes: Visualized thyroid appears normal. There are
scattered subcentimeter mediastinal lymph nodes. There is no evident
adenopathy by size criteria in the thoracic region. No esophageal
lesions are appreciable.

Lungs/Pleura: There is bibasilar atelectatic change. Lungs otherwise
are clear. No pleural effusions. No pneumothorax. Trachea and major
bronchial structures appear patent.

Upper Abdomen: Visualized upper abdominal structures appear normal.

Musculoskeletal: There are no blastic or lytic bone lesions. No
chest wall lesions.

Review of the MIP images confirms the above findings.
IMPRESSION: 1. No evident pulmonary embolus. No thoracic aortic aneurysm or
dissection.

2.  Mild bibasilar atelectasis.  Lungs otherwise clear.

3.  No evident adenopathy.

## 2021-01-04 IMAGING — DX DG CHEST 1V PORT
1 series · 1 of 1 positions shown · non-contrast
Comparison: [REDACTED]

CLINICAL DATA: COVID positive, chest pain

EXAM:
PORTABLE CHEST 1 VIEW

[chest ap]
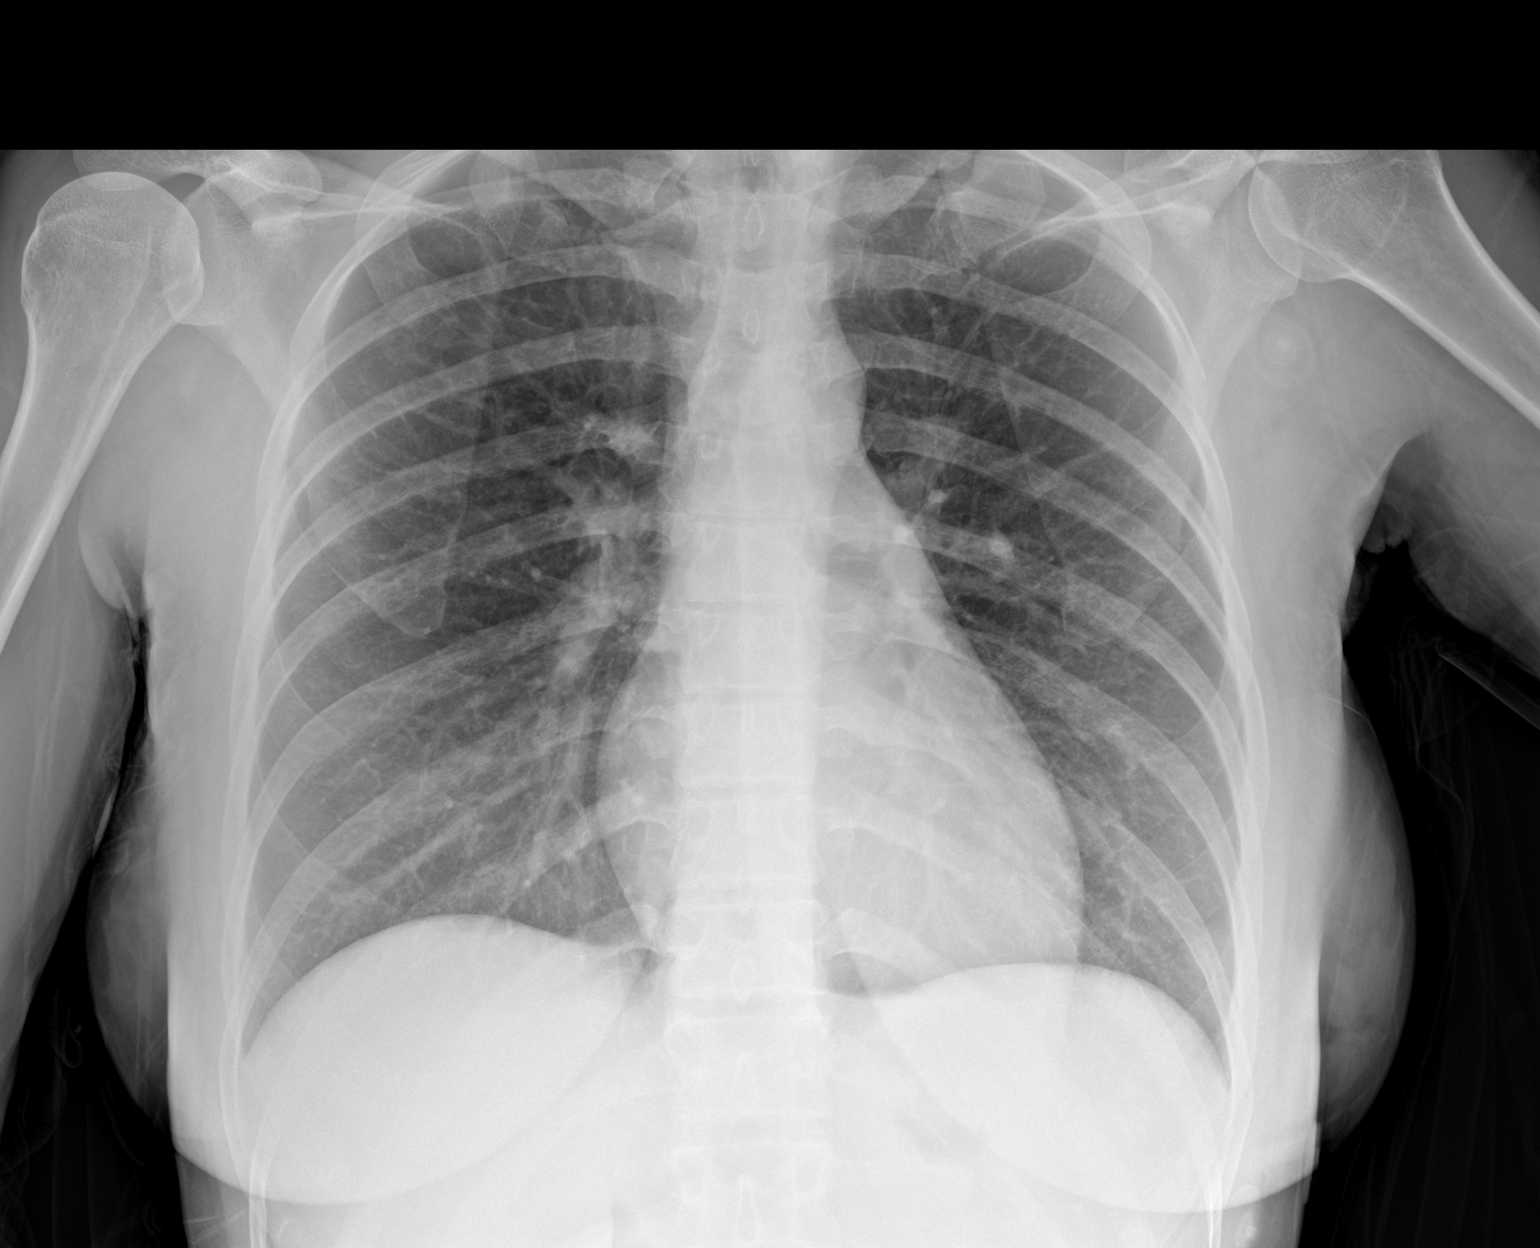

[1 of 1 positions shown; findings below may reference images not displayed]

FINDINGS: The heart size and mediastinal contours are within normal limits.
Both lungs are clear. No pleural effusion or pneumothorax. The
visualized skeletal structures are unremarkable.
IMPRESSION: No acute process in the chest.

## 2021-01-04 MED ORDER — OXYCODONE-ACETAMINOPHEN 5-325 MG PO TABS: 1.0000 | ORAL_TABLET | ORAL | 0 refills | Status: DC | PRN

## 2021-01-04 MED ORDER — ALUM & MAG HYDROXIDE-SIMETH 200-200-20 MG/5ML PO SUSP
30.0000 mL | Freq: Once | ORAL | Status: AC
  Administered 2021-01-04: 30 mL via ORAL
  Filled 2021-01-04: qty 30

## 2021-01-04 MED ORDER — PROMETHAZINE HCL 12.5 MG PO TABS: 12.5000 mg | ORAL_TABLET | Freq: Four times a day (QID) | ORAL | 0 refills | Status: DC | PRN

## 2021-01-04 MED ORDER — LIDOCAINE VISCOUS HCL 2 % MT SOLN
15.0000 mL | Freq: Once | OROMUCOSAL | Status: AC
  Administered 2021-01-04: 15 mL via ORAL
  Filled 2021-01-04: qty 15

## 2021-01-04 MED ORDER — KETOROLAC TROMETHAMINE 30 MG/ML IJ SOLN
15.0000 mg | Freq: Once | INTRAMUSCULAR | Status: AC
  Administered 2021-01-04: 15 mg via INTRAVENOUS
  Filled 2021-01-04: qty 1

## 2021-01-04 MED ORDER — SODIUM CHLORIDE 0.9 % IV BOLUS
500.0000 mL | Freq: Once | INTRAVENOUS | Status: AC
  Administered 2021-01-04: 500 mL via INTRAVENOUS

## 2021-01-04 MED ORDER — MORPHINE SULFATE (PF) 4 MG/ML IV SOLN
4.0000 mg | INTRAVENOUS | Status: DC | PRN
  Administered 2021-01-04: 4 mg via INTRAVENOUS
  Filled 2021-01-04: qty 1

## 2021-01-04 MED ORDER — ONDANSETRON HCL 4 MG/2ML IJ SOLN
4.0000 mg | Freq: Once | INTRAMUSCULAR | Status: AC
  Administered 2021-01-04: 4 mg via INTRAVENOUS
  Filled 2021-01-04: qty 2

## 2021-01-04 MED ORDER — IOHEXOL 350 MG/ML SOLN
75.0000 mL | Freq: Once | INTRAVENOUS | Status: AC | PRN
  Administered 2021-01-04: 75 mL via INTRAVENOUS

## 2021-01-04 NOTE — ED Triage Notes (Addendum)
Pt states some congestion followed with some CP.

## 2021-01-04 NOTE — ED Notes (Signed)
Patient updated by Dr. Quentin Cornwall at bedside.

## 2021-01-04 NOTE — ED Triage Notes (Signed)
Pt states she has covid , sx started last Tuesday and was feeling better, no more fever and for the past 2 days having epigastric and central chest pain/tightness.

## 2021-01-04 NOTE — ED Provider Notes (Signed)
[facility] Emergency Department Provider Note    Event Date/Time   First MD Initiated Contact with Patient 01/04/21 [phone]     (approximate)  I have reviewed the triage vital signs and the nursing notes.   HISTORY  Chief Complaint Covid Positive and Chest Pain    HPI NYAH SHEPHERD is a 40 y.o. female below listed past medical history presents to the ER for evaluation of midsternal nonradiating chest pain and pressure is been ongoing for the past day or so.  Recently diagnosed with Covid.  States she does have some discomfort with taking deep inspiration.  Pain somewhat relieved when she presses on her chest.  No diaphoresis.  She does smoke.  Not on any birth control.  No lower extremity swelling.    Past Medical History:  Diagnosis Date  . Allergy   . Anemia   . Anxiety   . BRCA negative 02/2018   MyRisk neg  . Endometriosis   . Family history of breast cancer 02/2018   My Risk neg  . Headache    h/o migraines  . Increased risk of breast cancer 02/2018   IBIS=18.8%/riskscore=26.3%  . Ovarian cyst   . Ovarian cyst   . PTSD (post-traumatic stress disorder)   . PTSD (post-traumatic stress disorder)   . Thrombocytopenia ([place])   . Vitamin D deficiency    Family History  Problem Relation Age of Onset  . Breast cancer Paternal Aunt 63  . Cancer Paternal Aunt        Breast  . Breast cancer Paternal Grandmother 85  . Stroke Paternal Grandmother   . Clotting disorder Paternal Grandmother   . Cancer Paternal Grandmother        Breast  . Depression Mother   . Depression Sister   . Alcohol abuse Maternal Grandmother   . Stroke Maternal Grandmother   . Depression Maternal Grandmother   . Cancer Maternal Grandmother        Liver  . Alcohol abuse Maternal Grandfather   . Stroke Maternal Grandfather   . Cancer Maternal Grandfather        Liver  . Stroke Paternal Grandfather   . Heart disease Paternal Grandfather    Past Surgical History:   Procedure Laterality Date  . CYSTOSCOPY Bilateral 12/10/2018   Procedure: CYSTOSCOPY;  Surgeon: Will Bonnet, MD;  Location: [facility] ORS;  Service: Gynecology;  Laterality: Bilateral;  . DILITATION & CURRETTAGE/HYSTROSCOPY WITH NOVASURE ABLATION  12/07/2015   Procedure: DILATATION & CURETTAGE/HYSTEROSCOPY WITH NOVASURE ABLATION;  Surgeon: Will Bonnet, MD;  Location: [facility] ORS;  Service: Gynecology;;  . LAPAROSCOPIC HYSTERECTOMY Bilateral 12/10/2018   Procedure: HYSTERECTOMY TOTAL LAPAROSCOPIC BILATERAL SALPRINGETOMY;  Surgeon: Will Bonnet, MD;  Location: [facility] ORS;  Service: Gynecology;  Laterality: Bilateral;  . MOUTH SURGERY    . TONSILLECTOMY    . TUBAL LIGATION     Patient Active Problem List   Diagnosis Date Noted  . Lymph node symptom 12/26/2020  . Acute pyelonephritis 05/22/2020  . Status post laparoscopic hysterectomy 12/10/2018  . Chronic pelvic pain in female 12/04/2018  . Right lower quadrant abdominal pain 03/16/2018  . Allergic rhinitis 10/03/2017  . Depression, recurrent ([place]) 10/03/2017  . Anxiety 06/13/2017  . Vitamin D deficiency   . Family history of breast cancer   . Breast lump on right side at 1 o'clock position 02/03/2017      Prior to Admission medications   Medication Sig Start Date End Date Taking? Authorizing Provider  ALPRAZolam (XANAX) 0.25 MG tablet Take 1 tablet (0.25 mg total) by mouth daily as needed for anxiety. 12/26/20   Myles Gip, DO  sertraline (ZOLOFT) 50 MG tablet Take 1 tablet (50 mg total) by mouth daily. 12/26/20   Myles Gip, DO    Allergies Patient has no known allergies.    Social History Social History   Tobacco Use  . Smoking status: Current Every Day Smoker    Packs/day: 0.50    Years: 13.00    Pack years: 6.50    Types: Cigarettes  . Smokeless tobacco: Never Used  Vaping Use  . Vaping Use: Never used  Substance Use Topics  . Alcohol use: Yes    Comment: socially  . Drug use: No    Review  of Systems Patient denies headaches, rhinorrhea, blurry vision, numbness, shortness of breath, chest pain, edema, cough, abdominal pain, nausea, vomiting, diarrhea, dysuria, fevers, rashes or hallucinations unless otherwise stated above in HPI. ____________________________________________   PHYSICAL EXAM:  VITAL SIGNS: Vitals:   01/04/21 0856 01/04/21 0900  BP: 105/78   Pulse: 85 78  Resp: 18   Temp: 97.7 F (36.5 C)   SpO2: 100% 100%    Constitutional: Alert and oriented.  Eyes: Conjunctivae are normal.  Head: Atraumatic. Nose: No congestion/rhinnorhea. Mouth/Throat: Mucous membranes are moist.   Neck: No stridor. Painless ROM.  Cardiovascular: Normal rate, regular rhythm. Grossly normal heart sounds.  Good peripheral circulation. Respiratory: Normal respiratory effort.  No retractions. Lungs CTAB. Gastrointestinal: Soft and nontender. No distention. No abdominal bruits. No CVA tenderness. Genitourinary:  Musculoskeletal: No lower extremity tenderness nor edema.  No joint effusions. Neurologic:  Normal speech and language. No gross focal neurologic deficits are appreciated. No facial droop Skin:  Skin is warm, dry and intact. No rash noted. Psychiatric: Mood and affect are normal. Speech and behavior are normal.  ____________________________________________   LABS (all labs ordered are listed, but only abnormal results are displayed)  No results found for this or any previous visit (from the past 24 hour(s)). ____________________________________________  EKG My review and personal interpretation at Time: 8:42   Indication: chest pain  Rate: 90  Rhythm: sinus Axis: normal Other: no stemi, no pr depression ____________________________________________  RADIOLOGY  I personally reviewed all radiographic images ordered to evaluate for the above acute complaints and reviewed radiology reports and findings.  These findings were personally discussed with the patient.  Please  see medical record for radiology report.  ____________________________________________   PROCEDURES  Procedure(s) performed:  Procedures    Critical Care performed: no ____________________________________________   INITIAL IMPRESSION / ASSESSMENT AND PLAN / ED COURSE  Pertinent labs & imaging results that were available during my care of the patient were reviewed by me and considered in my medical decision making (see chart for details).   DDX: covid 29, ACS, pericarditis, esophagitis, boerhaaves, pe, dissection, pna, bronchitis, costochondritis   BIRTHA HATLER is a 40 y.o. who presents to the ED with presentation as described above.  Patient nontoxic-appearing fairly localizable midsternal chest pain.  No diaphoresis or red flag symptoms.  Her EKG is nonischemic.  Troponin negative.  Blood work is otherwise reassuring.  Does not have any right upper quadrant abdominal pain.  LFTs normal lipase normal.  As she is to a Covid CT imaging of the chest was ordered to rule out PE.  No evidence of PE or other acute intrathoracic process.  May have a component of gastritis that she felt some improvement  after GI cocktail and was encouraged to take Pepcid or antiacid medication.  May have component of pleurisy given her symptoms over the splendidly she stable and appropriate for outpatient follow-up.  Discussed strict return precautions.  Have discussed with the patient and available family all diagnostics and treatments performed thus far and all questions were answered to the best of my ability. The patient demonstrates understanding and agreement with plan.      The patient was evaluated in Emergency Department today for the symptoms described in the history of present illness. He/she was evaluated in the context of the global COVID-19 pandemic, which necessitated consideration that the patient might be at risk for infection with the SARS-CoV-2 virus that causes COVID-19. Institutional  protocols and algorithms that pertain to the evaluation of patients at risk for COVID-19 are in a state of rapid change based on information released by regulatory bodies including the CDC and federal and state organizations. These policies and algorithms were followed during the patient's care in the ED.  As part of my medical decision making, I reviewed the following data within the [place] notes reviewed and incorporated, Labs reviewed, notes from prior ED visits and [place] Controlled Substance Database   ____________________________________________   FINAL CLINICAL IMPRESSION(S) / ED DIAGNOSES  Final diagnoses:  Atypical chest pain      NEW MEDICATIONS STARTED DURING THIS VISIT:  New Prescriptions   No medications on file     Note:  This document was prepared using Dragon voice recognition software and may include unintentional dictation errors.    Merlyn Lot, MD 01/04/21 1251

## 2021-01-04 NOTE — ED Notes (Signed)
Patient is resting comfortably. 

## 2021-01-04 NOTE — ED Notes (Signed)
Patient updated on new test ordered. Procedure explained. Patient verbalized understanding. No questions or need expressed by patient at this time.

## 2021-01-04 NOTE — ED Notes (Signed)
Lab called in regards to unresulted troponin. Lab stating troponin is still running.

## 2021-01-04 NOTE — ED Notes (Signed)
X-ray at bedside

## 2021-01-08 ENCOUNTER — Telehealth: Payer: Self-pay | Admitting: Licensed Clinical Social Worker

## 2021-01-08 ENCOUNTER — Telehealth: Payer: Medicaid Other

## 2021-01-08 ENCOUNTER — Ambulatory Visit: Payer: Self-pay | Admitting: Licensed Clinical Social Worker

## 2021-01-08 NOTE — Telephone Encounter (Addendum)
  Chronic Care Management    Clinical Social Work General Follow Up Note  01/08/2021 Name: Jessica Peters MRN: [id number] DOB: 1981/03/24  Jessica Peters is a 40 y.o. year old female who is a primary care patient of Valerie Roys, DO. The CCM team was consulted for assistance with [name] .   Review of patient status, including review of consultants reports, relevant laboratory and other test results, and collaboration with appropriate care team members and the patient's provider was performed as part of comprehensive patient evaluation and provision of chronic care management services.    LCSW completed third and final CCM outreach attempt today but was unable to reach patient successfully. A HIPPA compliant voice message was left encouraging patient to return call once available if still interested in [employer].  Outpatient Encounter Medications as of 01/08/2021  Medication Sig Note  . ALPRAZolam (XANAX) 0.25 MG tablet Take 1 tablet (0.25 mg total) by mouth daily as needed for anxiety.   Marland Kitchen oxyCODONE-acetaminophen (PERCOCET) 5-325 MG tablet Take 1 tablet by mouth every 4 (four) hours as needed for severe pain.   . promethazine (PHENERGAN) 12.5 MG tablet Take 1 tablet (12.5 mg total) by mouth every 6 (six) hours as needed.   . sertraline (ZOLOFT) 50 MG tablet Take 1 tablet (50 mg total) by mouth daily. 01/04/2021: Prescribed last week - has not started yet    No facility-administered encounter medications on file as of 01/08/2021.   Eula Fried, BSW, MSW, [facility] Care Management [email or web address] Phone: [phone]

## 2021-01-08 NOTE — Chronic Care Management (AMB) (Signed)
  Care Management   Follow Up Note   01/08/2021 Name: PAYSLIE MCCAIG MRN: [id number] DOB: 01/09/1981  Referred by: Valerie Roys, DO Reason for referral : No chief complaint on file.   CHANIKA BYLAND is a 40 y.o. year old female who is a primary care patient of Valerie Roys, DO. The care management team was consulted for assistance with care management and care coordination needs.    Review of patient status, including review of consultants reports, relevant laboratory and other test results, and collaboration with appropriate care team members and the patient's provider was performed as part of comprehensive patient evaluation and provision of chronic care management services.    LCSW completed final CCM outreach attempt today but was unable to reach patient successfully. A HIPPA compliant voice message was left encouraging patient to return call once available if she was still interested in CCM social work support. CCM will notify PCP of case closure. CCM LCSW will close referral at this time and take self off care team.   A HIPPA compliant phone message was left for the patient providing contact information and requesting a return call.    LCSW has been unable to make contact with the patient for follow up. The care management team is available to follow up with the patient after provider conversation with the patient regarding recommendation for care management engagement and subsequent re-referral to the care management team.   Eula Fried, BSW, MSW, [email or web address] Phone: [phone]

## 2021-01-23 ENCOUNTER — Ambulatory Visit: Payer: Medicaid Other | Admitting: Family Medicine

## 2021-09-01 ENCOUNTER — Emergency Department (HOSPITAL_COMMUNITY): Payer: Medicaid Other

## 2021-09-01 ENCOUNTER — Encounter (HOSPITAL_COMMUNITY)
Admission: EM | Disposition: A | Discharge status: Home / Self Care | Payer: Self-pay | Source: Home / Self Care | Attending: Neurological Surgery

## 2021-09-01 ENCOUNTER — Inpatient Hospital Stay (HOSPITAL_COMMUNITY): Payer: Medicaid Other

## 2021-09-01 ENCOUNTER — Inpatient Hospital Stay (HOSPITAL_COMMUNITY): Payer: Medicaid Other | Admitting: Certified Registered"

## 2021-09-01 ENCOUNTER — Encounter (HOSPITAL_COMMUNITY): Payer: Self-pay | Admitting: Neurological Surgery

## 2021-09-01 ENCOUNTER — Inpatient Hospital Stay (HOSPITAL_COMMUNITY)
Admission: EM | Admit: 2021-09-01 | Discharge: 2021-09-05 | DRG: 030 | Disposition: A | Discharge status: Home / Self Care | Payer: Medicaid Other | Attending: Neurological Surgery | Admitting: Neurological Surgery

## 2021-09-01 DIAGNOSIS — S13171A Dislocation of C6/C7 cervical vertebrae, initial encounter: Secondary | ICD-10-CM | POA: Diagnosis not present

## 2021-09-01 DIAGNOSIS — F17210 Nicotine dependence, cigarettes, uncomplicated: Secondary | ICD-10-CM | POA: Diagnosis not present

## 2021-09-01 DIAGNOSIS — Z419 Encounter for procedure for purposes other than remedying health state, unspecified: Secondary | ICD-10-CM

## 2021-09-01 DIAGNOSIS — Y92410 Unspecified street and highway as the place of occurrence of the external cause: Secondary | ICD-10-CM

## 2021-09-01 DIAGNOSIS — S12550A Other traumatic displaced spondylolisthesis of sixth cervical vertebra, initial encounter for closed fracture: Secondary | ICD-10-CM | POA: Diagnosis present

## 2021-09-01 DIAGNOSIS — Z818 Family history of other mental and behavioral disorders: Secondary | ICD-10-CM | POA: Diagnosis not present

## 2021-09-01 DIAGNOSIS — Z803 Family history of malignant neoplasm of breast: Secondary | ICD-10-CM | POA: Diagnosis not present

## 2021-09-01 DIAGNOSIS — Z823 Family history of stroke: Secondary | ICD-10-CM

## 2021-09-01 DIAGNOSIS — S12630A Unspecified traumatic displaced spondylolisthesis of seventh cervical vertebra, initial encounter for closed fracture: Secondary | ICD-10-CM | POA: Diagnosis not present

## 2021-09-01 DIAGNOSIS — Y909 Presence of alcohol in blood, level not specified: Secondary | ICD-10-CM | POA: Diagnosis present

## 2021-09-01 DIAGNOSIS — S13101A Dislocation of unspecified cervical vertebrae, initial encounter: Secondary | ICD-10-CM | POA: Diagnosis not present

## 2021-09-01 DIAGNOSIS — M532X2 Spinal instabilities, cervical region: Secondary | ICD-10-CM | POA: Diagnosis present

## 2021-09-01 DIAGNOSIS — S14106A Unspecified injury at C6 level of cervical spinal cord, initial encounter: Secondary | ICD-10-CM | POA: Diagnosis not present

## 2021-09-01 DIAGNOSIS — S12530A Unspecified traumatic displaced spondylolisthesis of sixth cervical vertebra, initial encounter for closed fracture: Secondary | ICD-10-CM | POA: Diagnosis not present

## 2021-09-01 DIAGNOSIS — Z8543 Personal history of malignant neoplasm of ovary: Secondary | ICD-10-CM

## 2021-09-01 DIAGNOSIS — Z832 Family history of diseases of the blood and blood-forming organs and certain disorders involving the immune mechanism: Secondary | ICD-10-CM

## 2021-09-01 DIAGNOSIS — M4312 Spondylolisthesis, cervical region: Secondary | ICD-10-CM | POA: Diagnosis not present

## 2021-09-01 DIAGNOSIS — Z20822 Contact with and (suspected) exposure to covid-19: Secondary | ICD-10-CM | POA: Diagnosis present

## 2021-09-01 DIAGNOSIS — Z041 Encounter for examination and observation following transport accident: Secondary | ICD-10-CM | POA: Diagnosis not present

## 2021-09-01 DIAGNOSIS — S12500A Unspecified displaced fracture of sixth cervical vertebra, initial encounter for closed fracture: Secondary | ICD-10-CM | POA: Diagnosis not present

## 2021-09-01 DIAGNOSIS — F4310 Post-traumatic stress disorder, unspecified: Secondary | ICD-10-CM | POA: Diagnosis not present

## 2021-09-01 DIAGNOSIS — S12650A Other traumatic displaced spondylolisthesis of seventh cervical vertebra, initial encounter for closed fracture: Secondary | ICD-10-CM | POA: Diagnosis present

## 2021-09-01 DIAGNOSIS — S14109A Unspecified injury at unspecified level of cervical spinal cord, initial encounter: Secondary | ICD-10-CM | POA: Diagnosis present

## 2021-09-01 DIAGNOSIS — M4322 Fusion of spine, cervical region: Secondary | ICD-10-CM | POA: Diagnosis not present

## 2021-09-01 DIAGNOSIS — S12600A Unspecified displaced fracture of seventh cervical vertebra, initial encounter for closed fracture: Secondary | ICD-10-CM | POA: Diagnosis not present

## 2021-09-01 DIAGNOSIS — Z8249 Family history of ischemic heart disease and other diseases of the circulatory system: Secondary | ICD-10-CM

## 2021-09-01 DIAGNOSIS — M5412 Radiculopathy, cervical region: Secondary | ICD-10-CM | POA: Diagnosis not present

## 2021-09-01 DIAGNOSIS — S02109A Fracture of base of skull, unspecified side, initial encounter for closed fracture: Secondary | ICD-10-CM | POA: Diagnosis not present

## 2021-09-01 DIAGNOSIS — Z981 Arthrodesis status: Secondary | ICD-10-CM | POA: Diagnosis not present

## 2021-09-01 DIAGNOSIS — S199XXA Unspecified injury of neck, initial encounter: Secondary | ICD-10-CM | POA: Diagnosis not present

## 2021-09-01 DIAGNOSIS — F10129 Alcohol abuse with intoxication, unspecified: Secondary | ICD-10-CM | POA: Diagnosis present

## 2021-09-01 DIAGNOSIS — M4310 Spondylolisthesis, site unspecified: Secondary | ICD-10-CM | POA: Diagnosis not present

## 2021-09-01 DIAGNOSIS — J9811 Atelectasis: Secondary | ICD-10-CM | POA: Diagnosis not present

## 2021-09-01 DIAGNOSIS — F418 Other specified anxiety disorders: Secondary | ICD-10-CM | POA: Diagnosis not present

## 2021-09-01 HISTORY — PX: ANTERIOR CERVICAL DECOMP/DISCECTOMY FUSION: SHX1161

## 2021-09-01 LAB — SAMPLE TO BLOOD BANK

## 2021-09-01 LAB — URINALYSIS, ROUTINE W REFLEX MICROSCOPIC
Bacteria, UA: NONE SEEN
Bilirubin Urine: NEGATIVE
Glucose, UA: NEGATIVE mg/dL
Ketones, ur: NEGATIVE mg/dL
Leukocytes,Ua: NEGATIVE
Nitrite: NEGATIVE
Protein, ur: NEGATIVE mg/dL
Specific Gravity, Urine: 1.003 — ABNORMAL LOW (ref 1.005–1.030)
pH: 6 (ref 5.0–8.0)

## 2021-09-01 LAB — COMPREHENSIVE METABOLIC PANEL
ALT: 19 U/L (ref 0–44)
AST: 32 U/L (ref 15–41)
Albumin: 4.3 g/dL (ref 3.5–5.0)
Alkaline Phosphatase: 19 U/L — ABNORMAL LOW (ref 38–126)
Anion gap: 11 (ref 5–15)
BUN: 6 mg/dL (ref 6–20)
CO2: 22 mmol/L (ref 22–32)
Calcium: 8.8 mg/dL — ABNORMAL LOW (ref 8.9–10.3)
Chloride: 106 mmol/L (ref 98–111)
Creatinine, Ser: 0.66 mg/dL (ref 0.44–1.00)
GFR, Estimated: >60 mL/min (ref >60)
Glucose, Bld: 91 mg/dL (ref 70–99)
Potassium: 3.4 mmol/L — ABNORMAL LOW (ref 3.5–5.1)
Sodium: 139 mmol/L (ref 135–145)
Total Bilirubin: 0.8 mg/dL (ref 0.3–1.2)
Total Protein: 6.9 g/dL (ref 6.5–8.1)

## 2021-09-01 LAB — CBC
HCT: 44.1 % (ref 36.0–46.0)
Hemoglobin: 14.5 g/dL (ref 12.0–15.0)
MCH: 31.3 pg (ref 26.0–34.0)
MCHC: 32.9 g/dL (ref 30.0–36.0)
MCV: 95 fL (ref 80.0–100.0)
Platelets: 132 10*3/uL — ABNORMAL LOW (ref 150–400)
RBC: 4.64 MIL/uL (ref 3.87–5.11)
RDW: 11.9 % (ref 11.5–15.5)
WBC: 11.8 10*3/uL — ABNORMAL HIGH (ref 4.0–10.5)
nRBC: 0 % (ref 0.0–0.2)

## 2021-09-01 LAB — I-STAT CHEM 8, ED
BUN: 7 mg/dL (ref 6–20)
Calcium, Ion: 1.09 mmol/L — ABNORMAL LOW (ref 1.15–1.40)
Chloride: 104 mmol/L (ref 98–111)
Creatinine, Ser: 1 mg/dL (ref 0.44–1.00)
Glucose, Bld: 87 mg/dL (ref 70–99)
HCT: 45 % (ref 36.0–46.0)
Hemoglobin: 15.3 g/dL — ABNORMAL HIGH (ref 12.0–15.0)
Potassium: 3.4 mmol/L — ABNORMAL LOW (ref 3.5–5.1)
Sodium: 143 mmol/L (ref 135–145)
TCO2: 25 mmol/L (ref 22–32)

## 2021-09-01 LAB — RESP PANEL BY RT-PCR (FLU A&B, COVID) ARPGX2
Influenza A by PCR: NEGATIVE
Influenza B by PCR: NEGATIVE
SARS Coronavirus 2 by RT PCR: NEGATIVE

## 2021-09-01 LAB — I-STAT BETA HCG BLOOD, ED (MC, WL, AP ONLY): I-stat hCG, quantitative: <5 m[IU]/mL (ref <5)

## 2021-09-01 LAB — SURGICAL PCR SCREEN
MRSA, PCR: NEGATIVE
Staphylococcus aureus: NEGATIVE

## 2021-09-01 LAB — PROTIME-INR
INR: 1.1 (ref 0.8–1.2)
Prothrombin Time: 13.7 seconds (ref 11.4–15.2)

## 2021-09-01 LAB — ETHANOL: Alcohol, Ethyl (B): 256 mg/dL — ABNORMAL HIGH (ref <10)

## 2021-09-01 LAB — LACTIC ACID, PLASMA: Lactic Acid, Venous: 2.1 mmol/L (ref 0.5–1.9)
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
## 2021-09-01 IMAGING — RF DG CERVICAL SPINE 2 OR 3 VIEWS
1 series · 5 of 5 positions shown · non-contrast
Comparison: MRI cervical spine [DATE] thoracic time: 0 minute
17 seconds

Images: 5

CLINICAL DATA: C6-C7 ACDF

EXAM:
CERVICAL SPINE - 2-3 VIEW

[Series 1: run · 5 of 5 slices shown]
[im 1/5]
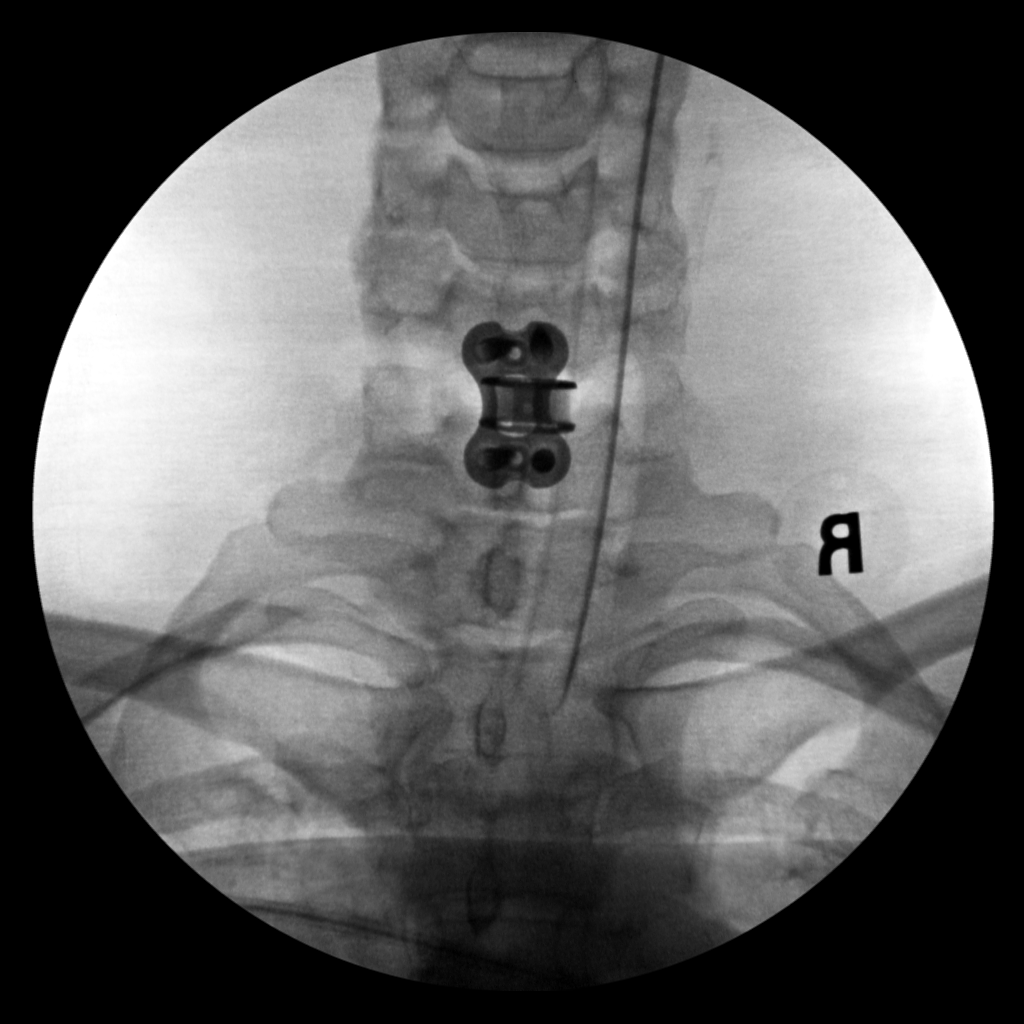
[im 2/5]
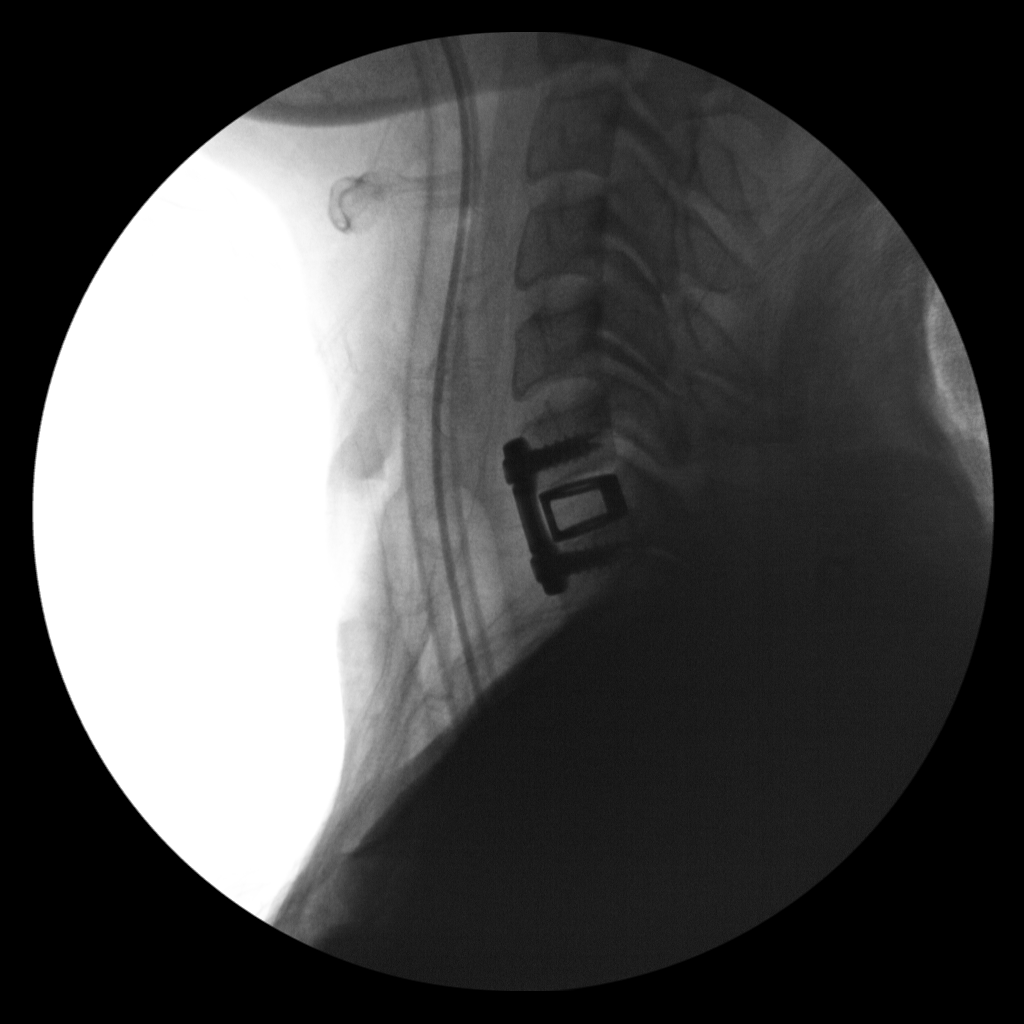
[im 3/5]
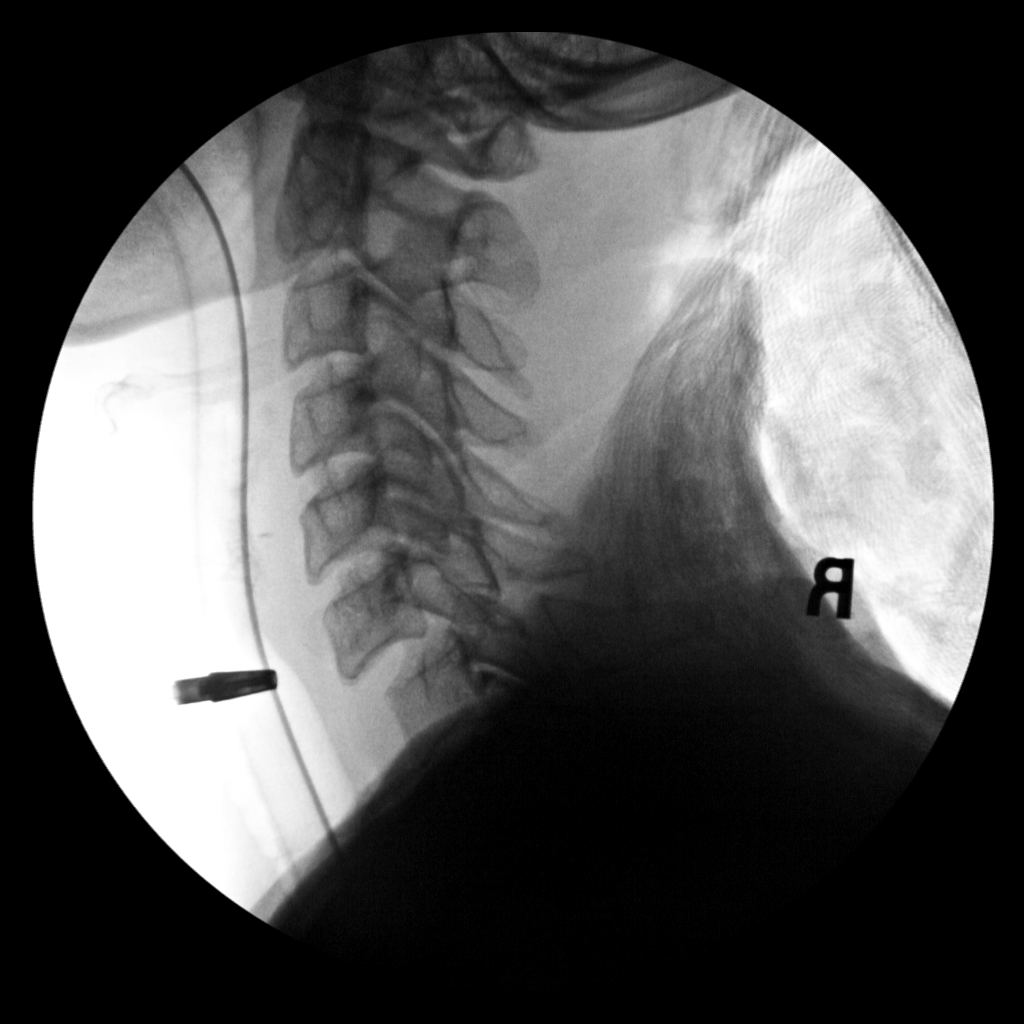
[im 4/5]
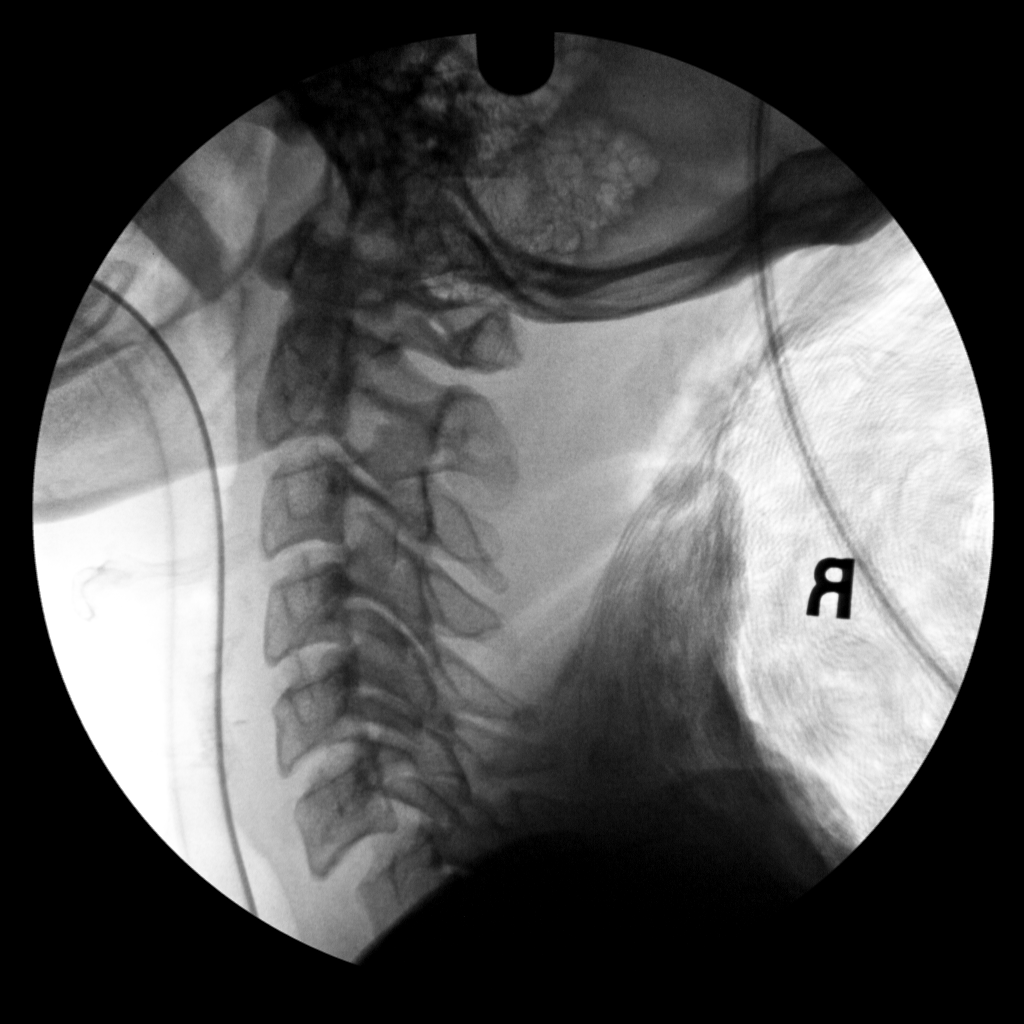
[im 5/5]
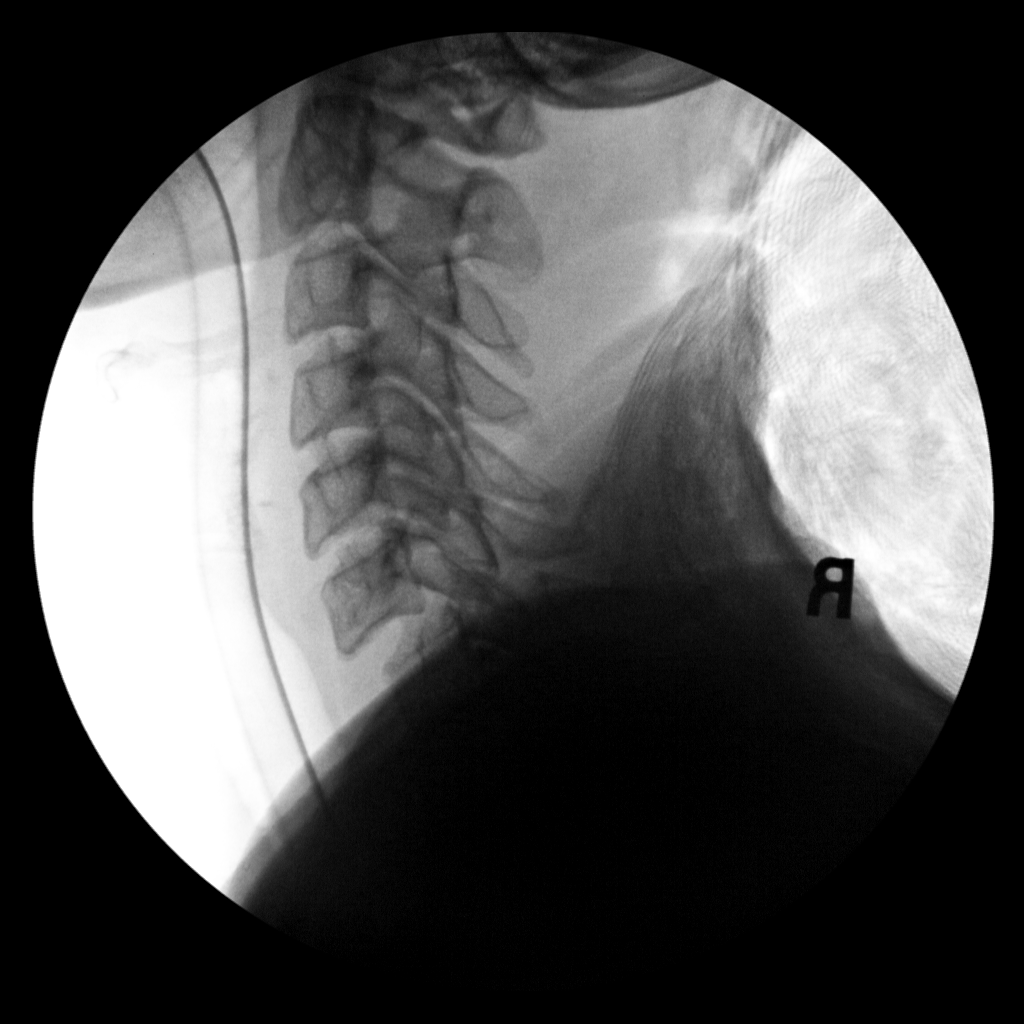

[5 of 5 positions shown; findings below may reference images not displayed]

FINDINGS: Initial images demonstrate significant anterior subluxation at C6-C7
with a jumped facet.

Subsequent images demonstrate reduction of anterolisthesis at C6-C7
with placement of anterior plate and screws and interval disc
prosthesis.

No additional osseous abnormality seen.

Facet fracture identified on prior CT exam not demonstrated
radiographically.
IMPRESSION: Post anterior fusion C6-C7.

## 2021-09-01 SURGERY — ANTERIOR CERVICAL DECOMPRESSION/DISCECTOMY FUSION 1 LEVEL
Anesthesia: General | Site: Neck

## 2021-09-01 MED ORDER — ONDANSETRON HCL 4 MG PO TABS
4.0000 mg | ORAL_TABLET | Freq: Four times a day (QID) | ORAL | Status: DC | PRN
  Administered 2021-09-04 – 2021-09-05 (×2): 4 mg via ORAL
  Filled 2021-09-01 (×2): qty 1

## 2021-09-01 MED ORDER — PHENYLEPHRINE 40 MCG/ML (10ML) SYRINGE FOR IV PUSH (FOR BLOOD PRESSURE SUPPORT)
PREFILLED_SYRINGE | INTRAVENOUS | Status: AC
  Filled 2021-09-01: qty 10

## 2021-09-01 MED ORDER — ACETAMINOPHEN 325 MG PO TABS
650.0000 mg | ORAL_TABLET | Freq: Four times a day (QID) | ORAL | Status: DC | PRN
  Administered 2021-09-04 (×2): 650 mg via ORAL
  Filled 2021-09-01 (×2): qty 2

## 2021-09-01 MED ORDER — MUSCLE RUB 10-15 % EX CREA
1.0000 "application " | TOPICAL_CREAM | CUTANEOUS | Status: DC | PRN
  Administered 2021-09-01: 1 via TOPICAL
  Filled 2021-09-01 (×2): qty 85

## 2021-09-01 MED ORDER — GABAPENTIN 300 MG PO CAPS
300.0000 mg | ORAL_CAPSULE | Freq: Three times a day (TID) | ORAL | Status: DC
  Administered 2021-09-01 – 2021-09-03 (×8): 300 mg via ORAL
  Filled 2021-09-01 (×8): qty 1

## 2021-09-01 MED ORDER — DIAZEPAM 5 MG PO TABS
5.0000 mg | ORAL_TABLET | Freq: Four times a day (QID) | ORAL | Status: DC | PRN
  Administered 2021-09-01 – 2021-09-04 (×9): 5 mg via ORAL
  Filled 2021-09-01 (×9): qty 1

## 2021-09-01 MED ORDER — DEXMEDETOMIDINE (PRECEDEX) IN NS 20 MCG/5ML (4 MCG/ML) IV SYRINGE
PREFILLED_SYRINGE | INTRAVENOUS | Status: DC | PRN
  Administered 2021-09-01 (×2): 4 ug via INTRAVENOUS

## 2021-09-01 MED ORDER — SODIUM CHLORIDE 0.9% FLUSH: 3.0000 mL | INTRAVENOUS | Status: DC | PRN

## 2021-09-01 MED ORDER — OXYCODONE HCL 5 MG/5ML PO SOLN: 5.0000 mg | Freq: Once | ORAL | Status: DC | PRN

## 2021-09-01 MED ORDER — PROMETHAZINE HCL 25 MG/ML IJ SOLN
6.2500 mg | INTRAMUSCULAR | Status: AC | PRN
  Administered 2021-09-01 (×2): 6.25 mg via INTRAVENOUS

## 2021-09-01 MED ORDER — CEFAZOLIN SODIUM-DEXTROSE 2-4 GM/100ML-% IV SOLN
2.0000 g | Freq: Three times a day (TID) | INTRAVENOUS | Status: AC
  Administered 2021-09-01 (×2): 2 g via INTRAVENOUS
  Filled 2021-09-01 (×2): qty 100

## 2021-09-01 MED ORDER — LACTATED RINGERS IV SOLN: INTRAVENOUS | Status: DC

## 2021-09-01 MED ORDER — METHOCARBAMOL 1000 MG/10ML IJ SOLN
500.0000 mg | Freq: Four times a day (QID) | INTRAVENOUS | Status: DC | PRN
  Administered 2021-09-02: 500 mg via INTRAVENOUS
  Filled 2021-09-01 (×2): qty 5
  Filled 2021-09-01: qty 500
  Filled 2021-09-01: qty 5

## 2021-09-01 MED ORDER — SUCCINYLCHOLINE CHLORIDE 200 MG/10ML IV SOSY
PREFILLED_SYRINGE | INTRAVENOUS | Status: DC | PRN
  Administered 2021-09-01: 70 mg via INTRAVENOUS

## 2021-09-01 MED ORDER — FENTANYL CITRATE (PF) 250 MCG/5ML IJ SOLN
INTRAMUSCULAR | Status: AC
  Filled 2021-09-01: qty 5

## 2021-09-01 MED ORDER — MIDAZOLAM HCL 2 MG/2ML IJ SOLN
INTRAMUSCULAR | Status: AC
  Filled 2021-09-01: qty 2

## 2021-09-01 MED ORDER — TRIAMCINOLONE ACETONIDE 40 MG/ML IJ SUSP
INTRAMUSCULAR | Status: DC | PRN
  Administered 2021-09-01: 40 mg

## 2021-09-01 MED ORDER — CEFAZOLIN SODIUM-DEXTROSE 2-4 GM/100ML-% IV SOLN
2.0000 g | INTRAVENOUS | Status: AC
  Administered 2021-09-01: 2 g via INTRAVENOUS
  Filled 2021-09-01: qty 100

## 2021-09-01 MED ORDER — SUGAMMADEX SODIUM 200 MG/2ML IV SOLN
INTRAVENOUS | Status: DC | PRN
  Administered 2021-09-01: 200 mg via INTRAVENOUS

## 2021-09-01 MED ORDER — SODIUM CHLORIDE 0.9 % IV SOLN
12.5000 mg | Freq: Four times a day (QID) | INTRAVENOUS | Status: DC | PRN
  Administered 2021-09-02 – 2021-09-04 (×4): 12.5 mg via INTRAVENOUS
  Filled 2021-09-01 (×4): qty 0.5

## 2021-09-01 MED ORDER — FENTANYL CITRATE (PF) 250 MCG/5ML IJ SOLN
INTRAMUSCULAR | Status: DC | PRN
  Administered 2021-09-01: 200 ug via INTRAVENOUS
  Administered 2021-09-01: 50 ug via INTRAVENOUS

## 2021-09-01 MED ORDER — SODIUM CHLORIDE 0.9 % IV BOLUS (SEPSIS)
1000.0000 mL | Freq: Once | INTRAVENOUS | Status: AC
  Administered 2021-09-01: 1000 mL via INTRAVENOUS

## 2021-09-01 MED ORDER — PHENYLEPHRINE HCL-NACL 20-0.9 MG/250ML-% IV SOLN
INTRAVENOUS | Status: DC | PRN
  Administered 2021-09-01: 50 ug/min via INTRAVENOUS

## 2021-09-01 MED ORDER — ROCURONIUM BROMIDE 10 MG/ML (PF) SYRINGE
PREFILLED_SYRINGE | INTRAVENOUS | Status: AC
  Filled 2021-09-01: qty 10

## 2021-09-01 MED ORDER — ROCURONIUM BROMIDE 10 MG/ML (PF) SYRINGE
PREFILLED_SYRINGE | INTRAVENOUS | Status: DC | PRN
  Administered 2021-09-01 (×2): 50 mg via INTRAVENOUS

## 2021-09-01 MED ORDER — SENNOSIDES-DOCUSATE SODIUM 8.6-50 MG PO TABS: 1.0000 | ORAL_TABLET | Freq: Every evening | ORAL | Status: DC | PRN

## 2021-09-01 MED ORDER — HYDROMORPHONE HCL 1 MG/ML IJ SOLN
0.5000 mg | INTRAMUSCULAR | Status: DC | PRN
  Administered 2021-09-01 – 2021-09-03 (×18): 1 mg via INTRAVENOUS
  Filled 2021-09-01 (×18): qty 1

## 2021-09-01 MED ORDER — TRIAMCINOLONE ACETONIDE 40 MG/ML IJ SUSP
40.0000 mg | INTRAMUSCULAR | Status: AC
  Administered 2021-09-01: 40 mg via INTRA_ARTICULAR
  Filled 2021-09-01: qty 1

## 2021-09-01 MED ORDER — HEMOSTATIC AGENTS (NO CHARGE) OPTIME
TOPICAL | Status: DC | PRN
  Administered 2021-09-01: 1 via TOPICAL

## 2021-09-01 MED ORDER — 0.9 % SODIUM CHLORIDE (POUR BTL) OPTIME
TOPICAL | Status: DC | PRN
  Administered 2021-09-01: 1000 mL

## 2021-09-01 MED ORDER — NICOTINE 21 MG/24HR TD PT24
21.0000 mg | MEDICATED_PATCH | Freq: Every day | TRANSDERMAL | Status: DC
  Administered 2021-09-01 – 2021-09-05 (×5): 21 mg via TRANSDERMAL
  Filled 2021-09-01 (×5): qty 1

## 2021-09-01 MED ORDER — IOHEXOL 300 MG/ML  SOLN
100.0000 mL | Freq: Once | INTRAMUSCULAR | Status: AC | PRN
  Administered 2021-09-01: 100 mL via INTRAVENOUS

## 2021-09-01 MED ORDER — LIDOCAINE 2% (20 MG/ML) 5 ML SYRINGE
INTRAMUSCULAR | Status: AC
  Filled 2021-09-01: qty 5

## 2021-09-01 MED ORDER — ONDANSETRON HCL 4 MG/2ML IJ SOLN
4.0000 mg | Freq: Once | INTRAMUSCULAR | Status: AC
  Administered 2021-09-01: 4 mg via INTRAVENOUS
  Filled 2021-09-01: qty 2

## 2021-09-01 MED ORDER — FENTANYL CITRATE (PF) 100 MCG/2ML IJ SOLN
INTRAMUSCULAR | Status: AC
  Filled 2021-09-01: qty 2

## 2021-09-01 MED ORDER — OXYCODONE HCL 5 MG PO TABS
10.0000 mg | ORAL_TABLET | ORAL | Status: DC | PRN
  Administered 2021-09-01 – 2021-09-04 (×14): 10 mg via ORAL
  Filled 2021-09-01 (×14): qty 2

## 2021-09-01 MED ORDER — PROMETHAZINE HCL 25 MG/ML IJ SOLN
INTRAMUSCULAR | Status: AC
  Filled 2021-09-01: qty 1

## 2021-09-01 MED ORDER — SODIUM CHLORIDE 0.9 % IV SOLN: 250.0000 mL | INTRAVENOUS | Status: DC

## 2021-09-01 MED ORDER — NICOTINE 21 MG/24HR TD PT24: 21.0000 mg | MEDICATED_PATCH | Freq: Every day | TRANSDERMAL | Status: DC

## 2021-09-01 MED ORDER — HYDROMORPHONE HCL 1 MG/ML IJ SOLN
1.0000 mg | Freq: Once | INTRAMUSCULAR | Status: AC
Start: 2021-09-01 — End: 2021-09-01
  Administered 2021-09-01: 1 mg via INTRAVENOUS
  Filled 2021-09-01: qty 1

## 2021-09-01 MED ORDER — OXYCODONE HCL 5 MG PO TABS: 5.0000 mg | ORAL_TABLET | Freq: Once | ORAL | Status: DC | PRN

## 2021-09-01 MED ORDER — THROMBIN 20000 UNITS EX SOLR
CUTANEOUS | Status: AC
  Filled 2021-09-01: qty 20000

## 2021-09-01 MED ORDER — PROPOFOL 10 MG/ML IV BOLUS
INTRAVENOUS | Status: DC | PRN
  Administered 2021-09-01: 30 mg via INTRAVENOUS
  Administered 2021-09-01: 100 mg via INTRAVENOUS

## 2021-09-01 MED ORDER — PROPOFOL 10 MG/ML IV BOLUS
INTRAVENOUS | Status: AC
  Filled 2021-09-01: qty 20

## 2021-09-01 MED ORDER — MIDAZOLAM HCL 5 MG/5ML IJ SOLN
INTRAMUSCULAR | Status: DC | PRN
  Administered 2021-09-01: 2 mg via INTRAVENOUS

## 2021-09-01 MED ORDER — PHENOL 1.4 % MT LIQD
1.0000 | OROMUCOSAL | Status: DC | PRN
  Administered 2021-09-01: 1 via OROMUCOSAL
  Filled 2021-09-01: qty 177

## 2021-09-01 MED ORDER — ACETAMINOPHEN 10 MG/ML IV SOLN: 1000.0000 mg | Freq: Once | INTRAVENOUS | Status: DC | PRN

## 2021-09-01 MED ORDER — ACETAMINOPHEN 650 MG RE SUPP: 650.0000 mg | Freq: Four times a day (QID) | RECTAL | Status: DC | PRN

## 2021-09-01 MED ORDER — POTASSIUM CHLORIDE CRYS ER 20 MEQ PO TBCR
40.0000 meq | EXTENDED_RELEASE_TABLET | Freq: Once | ORAL | Status: AC
  Administered 2021-09-01: 40 meq via ORAL
  Filled 2021-09-01: qty 2

## 2021-09-01 MED ORDER — FENTANYL CITRATE PF 50 MCG/ML IJ SOSY
50.0000 ug | PREFILLED_SYRINGE | Freq: Once | INTRAMUSCULAR | Status: AC
Start: 2021-09-01 — End: 2021-09-01
  Administered 2021-09-01: 50 ug via INTRAVENOUS

## 2021-09-01 MED ORDER — MENTHOL 3 MG MT LOZG
1.0000 | LOZENGE | OROMUCOSAL | Status: DC | PRN
  Filled 2021-09-01: qty 9

## 2021-09-01 MED ORDER — DIAZEPAM 5 MG/ML IJ SOLN
2.5000 mg | Freq: Once | INTRAMUSCULAR | Status: AC
  Administered 2021-09-01: 2.5 mg via INTRAVENOUS
  Filled 2021-09-01: qty 2

## 2021-09-01 MED ORDER — AMISULPRIDE (ANTIEMETIC) 5 MG/2ML IV SOLN: 10.0000 mg | Freq: Once | INTRAVENOUS | Status: DC | PRN

## 2021-09-01 MED ORDER — THROMBIN 5000 UNITS EX SOLR
CUTANEOUS | Status: AC
  Filled 2021-09-01: qty 5000

## 2021-09-01 MED ORDER — SODIUM CHLORIDE 0.9% FLUSH
3.0000 mL | Freq: Two times a day (BID) | INTRAVENOUS | Status: DC
  Administered 2021-09-01 – 2021-09-04 (×8): 3 mL via INTRAVENOUS

## 2021-09-01 MED ORDER — LIDOCAINE 2% (20 MG/ML) 5 ML SYRINGE
INTRAMUSCULAR | Status: DC | PRN
  Administered 2021-09-01: 60 mg via INTRAVENOUS

## 2021-09-01 MED ORDER — ONDANSETRON HCL 4 MG/2ML IJ SOLN
INTRAMUSCULAR | Status: AC
  Filled 2021-09-01: qty 2

## 2021-09-01 MED ORDER — CHLORHEXIDINE GLUCONATE CLOTH 2 % EX PADS
6.0000 | MEDICATED_PAD | Freq: Every day | CUTANEOUS | Status: DC
  Administered 2021-09-02 – 2021-09-05 (×4): 6 via TOPICAL

## 2021-09-01 MED ORDER — DEXAMETHASONE SODIUM PHOSPHATE 10 MG/ML IJ SOLN
INTRAMUSCULAR | Status: DC | PRN
  Administered 2021-09-01: 5 mg via INTRAVENOUS

## 2021-09-01 MED ORDER — HYDROCODONE-ACETAMINOPHEN 10-325 MG PO TABS: 1.0000 | ORAL_TABLET | ORAL | Status: DC | PRN

## 2021-09-01 MED ORDER — THIAMINE HCL 100 MG/ML IJ SOLN
Freq: Once | INTRAVENOUS | Status: AC
  Filled 2021-09-01: qty 1000

## 2021-09-01 MED ORDER — DEXAMETHASONE SODIUM PHOSPHATE 10 MG/ML IJ SOLN
INTRAMUSCULAR | Status: AC
  Filled 2021-09-01: qty 1

## 2021-09-01 MED ORDER — ONDANSETRON HCL 4 MG/2ML IJ SOLN
4.0000 mg | Freq: Four times a day (QID) | INTRAMUSCULAR | Status: DC | PRN
  Administered 2021-09-01 – 2021-09-05 (×8): 4 mg via INTRAVENOUS
  Filled 2021-09-01 (×8): qty 2

## 2021-09-01 MED ORDER — THROMBIN 5000 UNITS EX SOLR
OROMUCOSAL | Status: DC | PRN
  Administered 2021-09-01: 5 mL via TOPICAL

## 2021-09-01 MED ORDER — FENTANYL CITRATE (PF) 100 MCG/2ML IJ SOLN: 25.0000 ug | INTRAMUSCULAR | Status: DC | PRN

## 2021-09-01 MED ORDER — SODIUM CHLORIDE 0.9 % IV SOLN
1000.0000 mL | INTRAVENOUS | Status: DC
  Administered 2021-09-01 – 2021-09-02 (×4): 1000 mL via INTRAVENOUS

## 2021-09-01 SURGICAL SUPPLY — 60 items
BAG COUNTER SPONGE SURGICOUNT (BAG) ×2 IMPLANT
BAND RUBBER #18 3X1/16 STRL (MISCELLANEOUS) ×4 IMPLANT
BENZOIN TINCTURE PRP APPL 2/3 (GAUZE/BANDAGES/DRESSINGS) ×2 IMPLANT
BIT DRILL NEURO 2X3.1 SFT TUCH (MISCELLANEOUS) ×1 IMPLANT
BLADE CLIPPER SURG (BLADE) IMPLANT
BUR CARBIDE MATCH 3.0 (BURR) ×2 IMPLANT
CANISTER SUCT 3000ML PPV (MISCELLANEOUS) ×2 IMPLANT
CARTRIDGE OIL MAESTRO DRILL (MISCELLANEOUS) ×1 IMPLANT
CLSR STERI-STRIP ANTIMIC 1/2X4 (GAUZE/BANDAGES/DRESSINGS) ×2 IMPLANT
COVER MAYO STAND STRL (DRAPES) ×4 IMPLANT
DIFFUSER DRILL AIR PNEUMATIC (MISCELLANEOUS) ×2 IMPLANT
DRAPE C-ARM 42X72 X-RAY (DRAPES) ×2 IMPLANT
DRAPE HALF SHEET 40X57 (DRAPES) IMPLANT
DRAPE LAPAROTOMY 100X72X124 (DRAPES) ×2 IMPLANT
DRAPE MICROSCOPE LEICA (MISCELLANEOUS) ×2 IMPLANT
DRILL NEURO 2X3.1 SOFT TOUCH (MISCELLANEOUS) ×2
DRSG OPSITE POSTOP 4X6 (GAUZE/BANDAGES/DRESSINGS) ×2 IMPLANT
DURAPREP 6ML APPLICATOR 50/CS (WOUND CARE) ×2 IMPLANT
ELECT COATED BLADE 2.86 ST (ELECTRODE) ×2 IMPLANT
ELECT REM PT RETURN 9FT ADLT (ELECTROSURGICAL) ×2
ELECTRODE REM PT RTRN 9FT ADLT (ELECTROSURGICAL) ×1 IMPLANT
EVACUATOR 1/8 PVC DRAIN (DRAIN) IMPLANT
GAUZE 4X4 16PLY ~~LOC~~+RFID DBL (SPONGE) ×2 IMPLANT
GLOVE EXAM NITRILE LRG STRL (GLOVE) IMPLANT
GLOVE EXAM NITRILE XL STR (GLOVE) IMPLANT
GLOVE EXAM NITRILE XS STR PU (GLOVE) IMPLANT
GLOVE SRG 8 PF TXTR STRL LF DI (GLOVE) ×1 IMPLANT
GLOVE SURG ENC MOIS LTX SZ6.5 (GLOVE) ×4 IMPLANT
GLOVE SURG LTX SZ8 (GLOVE) ×4 IMPLANT
GLOVE SURG UNDER POLY LF SZ7 (GLOVE) ×4 IMPLANT
GLOVE SURG UNDER POLY LF SZ8 (GLOVE) ×1
GOWN STRL REUS W/ TWL LRG LVL3 (GOWN DISPOSABLE) ×3 IMPLANT
GOWN STRL REUS W/ TWL XL LVL3 (GOWN DISPOSABLE) ×1 IMPLANT
GOWN STRL REUS W/TWL 2XL LVL3 (GOWN DISPOSABLE) IMPLANT
GOWN STRL REUS W/TWL LRG LVL3 (GOWN DISPOSABLE) ×3
GOWN STRL REUS W/TWL XL LVL3 (GOWN DISPOSABLE) ×1
HEMOSTAT POWDER KIT SURGIFOAM (HEMOSTASIS) ×2 IMPLANT
KIT BASIN OR (CUSTOM PROCEDURE TRAY) ×2 IMPLANT
KIT TURNOVER KIT B (KITS) ×2 IMPLANT
NEEDLE HYPO 22GX1.5 SAFETY (NEEDLE) ×2 IMPLANT
NEEDLE SPNL 18GX3.5 QUINCKE PK (NEEDLE) ×2 IMPLANT
NS IRRIG 1000ML POUR BTL (IV SOLUTION) ×2 IMPLANT
OIL CARTRIDGE MAESTRO DRILL (MISCELLANEOUS) ×2
PACK LAMINECTOMY NEURO (CUSTOM PROCEDURE TRAY) ×2 IMPLANT
PAD ARMBOARD 7.5X6 YLW CONV (MISCELLANEOUS) ×4 IMPLANT
PASTE BONE GRAFTON 1CC (Bone Implant) ×2 IMPLANT
PIN DISTRACTION 14MM (PIN) ×8 IMPLANT
PLATE VISION ELITE 27.5MM (Plate) ×2 IMPLANT
SCREW SD 15MM FA (Screw) ×8 IMPLANT
SPACER LORD ENDO 14X16X9 6D (Spacer) ×2 IMPLANT
SPONGE INTESTINAL PEANUT (DISPOSABLE) ×2 IMPLANT
SPONGE SURGIFOAM ABS GEL 100 (HEMOSTASIS) ×2 IMPLANT
SPONGE SURGIFOAM ABS GEL SZ50 (HEMOSTASIS) IMPLANT
STAPLER VISISTAT 35W (STAPLE) ×2 IMPLANT
STRIP CLOSURE SKIN 1/2X4 (GAUZE/BANDAGES/DRESSINGS) ×2 IMPLANT
TAPE SURG TRANSPORE 1 IN (GAUZE/BANDAGES/DRESSINGS) ×1 IMPLANT
TAPE SURGICAL TRANSPORE 1 IN (GAUZE/BANDAGES/DRESSINGS) ×1
TOWEL GREEN STERILE (TOWEL DISPOSABLE) ×2 IMPLANT
TOWEL GREEN STERILE FF (TOWEL DISPOSABLE) ×2 IMPLANT
WATER STERILE IRR 1000ML POUR (IV SOLUTION) ×2 IMPLANT

## 2021-09-01 NOTE — ED Notes (Addendum)
ED Provider at bedside. 
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
## 2021-09-01 NOTE — ED Notes (Signed)
SHP at bedside reading patient rights
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
## 2021-09-01 NOTE — Progress Notes (Signed)
I saw the patient in preop.  I discussed the findings of the MRI with the patient extensively.  I did discuss that there is a possibility with out complete reduction of her fracture from an anterior approach, posterior approach may be indicated.  She also has significant ligamentous injury posteriorly with muscular injury as well.  I explained to her the goals and expected outcomes of the surgery and all risks of surgery including but not limited to hematoma, infection, bleeding, need for more surgery, hardware failure, nerve or spinal cord injury, CSF leak, vocal cord injury, esophageal injury.  She understands and agrees to proceed with surgical intervention.   Elwin Sleight, [facility] Cell: [phone]

## 2021-09-01 NOTE — Transfer of Care (Signed)
Immediate Anesthesia Transfer of Care Note  Patient: Jessica Peters  Procedure(s) Performed: ANTERIOR CERVICAL DECOMPRESSION/DISCECTOMY FUSION Cervical six-Cervical seven, Open Treatment and REDUCTION OF FRACTURE (Neck)  Patient Location: PACU  Anesthesia Type:General  Level of Consciousness: awake, alert  and confused  Airway & Oxygen Therapy: Patient Spontanous Breathing  Post-op Assessment: Post -op Vital signs reviewed and stable  Post vital signs: stable  Last Vitals:  Vitals Value Taken Time  BP 130/87 09/01/21 1007  Temp    Pulse 97 09/01/21 1008  Resp 16 09/01/21 1008  SpO2 100 % 09/01/21 1008  Vitals shown include unvalidated device data.  Last Pain:  Vitals:   09/01/21 0741  TempSrc:   PainSc: 10-Worst pain ever      Patients Stated Pain Goal: 3 (16/10/96 0454)  Complications: No notable events documented.

## 2021-09-01 NOTE — Anesthesia Preprocedure Evaluation (Addendum)
Anesthesia Evaluation  Patient identified by MRN, date of birth, ID band Patient awake    Reviewed: Allergy & Precautions, NPO status , Patient's Chart, lab work & pertinent test results  Airway Mallampati: II  TM Distance: >3 FB Neck ROM: Limited    Dental  (+) Missing, Chipped,    Pulmonary Current Smoker and Patient abstained from smoking.,    Pulmonary exam normal breath sounds clear to auscultation       Cardiovascular negative cardio ROS Normal cardiovascular exam Rhythm:Regular Rate:Normal  ECG: NSR, rate 91   Neuro/Psych  Headaches, PSYCHIATRIC DISORDERS Anxiety Depression PTSD (post-traumatic stress disorder) C-spine not cleared    GI/Hepatic negative GI ROS, (+)     substance abuse  alcohol use,   Endo/Other  negative endocrine ROS  Renal/GU negative Renal ROS     Musculoskeletal negative musculoskeletal ROS (+)   Abdominal   Peds  Hematology negative hematology ROS (+)   Anesthesia Other Findings TRAUMATIC SPONDYLOLISTHESIS  Reproductive/Obstetrics S/p hysterectomy                           Anesthesia Physical Anesthesia Plan  ASA: 2 and emergent  Anesthesia Plan: General   Post-op Pain Management:    Induction: Intravenous  PONV Risk Score and Plan: 2 and Ondansetron, Dexamethasone, Midazolam and Treatment may vary due to age or medical condition  Airway Management Planned: Oral ETT and Video Laryngoscope Planned  Additional Equipment:   Intra-op Plan:   Post-operative Plan: Extubation in OR  Informed Consent: I have reviewed the patients History and Physical, chart, labs and discussed the procedure including the risks, benefits and alternatives for the proposed anesthesia with the patient or authorized representative who has indicated his/her understanding and acceptance.     Dental advisory given  Plan Discussed with: CRNA  Anesthesia Plan Comments:         Anesthesia Quick Evaluation

## 2021-09-01 NOTE — Anesthesia Procedure Notes (Addendum)
Procedure Name: Intubation Date/Time: 09/01/2021 8:00 AM Performed by: Lavell Luster, CRNA Pre-anesthesia Checklist: Patient identified, Emergency Drugs available, Suction available, Patient being monitored and Timeout performed Patient Re-evaluated:Patient Re-evaluated prior to induction Oxygen Delivery Method: Circle system utilized Preoxygenation: Pre-oxygenation with 100% oxygen Induction Type: IV induction, Cricoid Pressure applied and Rapid sequence Ventilation: Mask ventilation without difficulty Laryngoscope Size: Mac and 3 Grade View: Grade I Tube type: Oral Tube size: 7.0 mm Number of attempts: 1 Airway Equipment and Method: Stylet and Video-laryngoscopy Placement Confirmation: ETT inserted through vocal cords under direct vision, positive ETCO2 and breath sounds checked- equal and bilateral Secured at: 22 cm Tube secured with: Tape Dental Injury: Teeth and Oropharynx as per pre-operative assessment  Difficulty Due To: Difficult Airway- due to cervical collar, Difficult Airway-  due to neck instability and Difficult Airway- due to reduced neck mobility Comments: Rapid sequence induction and intubation with videolaryngoscope.  Grade I view, ETT passed easily.  Full spine precautions maintained and C collar in place.  Dr Dawley assisted with positioning after airway secured.  Henderson Cloud, CRNA

## 2021-09-01 NOTE — Anesthesia Postprocedure Evaluation (Signed)
Anesthesia Post Note  Patient: DANETT PALAZZO  Procedure(s) Performed: ANTERIOR CERVICAL DECOMPRESSION/DISCECTOMY FUSION Cervical six-Cervical seven, Open Treatment and REDUCTION OF FRACTURE (Neck)     Patient location during evaluation: PACU Anesthesia Type: General Level of consciousness: awake Pain management: pain level controlled Vital Signs Assessment: post-procedure vital signs reviewed and stable Respiratory status: spontaneous breathing, nonlabored ventilation, respiratory function stable and patient connected to nasal cannula oxygen Cardiovascular status: blood pressure returned to baseline and stable Postop Assessment: no apparent nausea or vomiting Anesthetic complications: no   No notable events documented.  Last Vitals:  Vitals:   09/01/21 1400 09/01/21 1500  BP: 115/70 119/73  Pulse: 85 100  Resp: 14 17  Temp:    SpO2: 95% 93%    Last Pain:  Vitals:   09/01/21 1545  TempSrc:   PainSc: 8                  Delbert Vu P Lizzette Carbonell
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
## 2021-09-01 NOTE — Progress Notes (Signed)
   09/01/21 1900  Provider Notification  Provider Name/Title Elwin Sleight  Date Provider Notified 09/01/21  Time Provider Notified 1840  Notification Type Page  Notification Reason Requested by patient/family (patient requesting topical for arm pain)  Provider response See new orders  Date of Provider Response 09/01/21  Time of Provider Response 1930   Orders placed for muscle rub cream. Patient at time of call back with large emesis episode; orders given by T. Dawley MD for phenergan.
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
## 2021-09-02 LAB — CBC
HCT: 36 % (ref 36.0–46.0)
Hemoglobin: 11.9 g/dL — ABNORMAL LOW (ref 12.0–15.0)
MCH: 30.7 pg (ref 26.0–34.0)
MCHC: 33.1 g/dL (ref 30.0–36.0)
MCV: 93 fL (ref 80.0–100.0)
Platelets: 126 10*3/uL — ABNORMAL LOW (ref 150–400)
RBC: 3.87 MIL/uL (ref 3.87–5.11)
RDW: 12.1 % (ref 11.5–15.5)
WBC: 13.8 10*3/uL — ABNORMAL HIGH (ref 4.0–10.5)
nRBC: 0 % (ref 0.0–0.2)

## 2021-09-02 LAB — HIV ANTIBODY (ROUTINE TESTING W REFLEX): HIV Screen 4th Generation wRfx: NONREACTIVE

## 2021-09-02 LAB — BASIC METABOLIC PANEL
Anion gap: 7 (ref 5–15)
BUN: 8 mg/dL (ref 6–20)
CO2: 24 mmol/L (ref 22–32)
Calcium: 7.9 mg/dL — ABNORMAL LOW (ref 8.9–10.3)
Chloride: 102 mmol/L (ref 98–111)
Creatinine, Ser: 0.68 mg/dL (ref 0.44–1.00)
GFR, Estimated: >60 mL/min (ref >60)
Glucose, Bld: 107 mg/dL — ABNORMAL HIGH (ref 70–99)
Potassium: 3.8 mmol/L (ref 3.5–5.1)
Sodium: 133 mmol/L — ABNORMAL LOW (ref 135–145)

## 2021-09-02 MED ORDER — DEXAMETHASONE SODIUM PHOSPHATE 4 MG/ML IJ SOLN
4.0000 mg | Freq: Three times a day (TID) | INTRAMUSCULAR | Status: AC
  Administered 2021-09-02 – 2021-09-04 (×6): 4 mg via INTRAVENOUS
  Filled 2021-09-02 (×6): qty 1
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
## 2021-09-04 ENCOUNTER — Encounter (HOSPITAL_COMMUNITY): Payer: Self-pay | Admitting: Neurological Surgery

## 2021-09-04 MED ORDER — GABAPENTIN 400 MG PO CAPS
400.0000 mg | ORAL_CAPSULE | Freq: Three times a day (TID) | ORAL | Status: DC
  Administered 2021-09-04 – 2021-09-05 (×4): 400 mg via ORAL
  Filled 2021-09-04 (×4): qty 1

## 2021-09-04 MED ORDER — DIAZEPAM 5 MG PO TABS
5.0000 mg | ORAL_TABLET | Freq: Four times a day (QID) | ORAL | Status: DC
  Administered 2021-09-04 – 2021-09-05 (×4): 5 mg via ORAL
  Filled 2021-09-04 (×4): qty 1

## 2021-09-04 MED ORDER — OXYCODONE HCL 5 MG PO TABS
5.0000 mg | ORAL_TABLET | ORAL | Status: DC | PRN
Start: 2021-09-04 — End: 2021-09-05

## 2021-09-04 MED ORDER — SENNOSIDES-DOCUSATE SODIUM 8.6-50 MG PO TABS
1.0000 | ORAL_TABLET | Freq: Two times a day (BID) | ORAL | Status: DC
  Administered 2021-09-04 – 2021-09-05 (×3): 1 via ORAL
  Filled 2021-09-04 (×3): qty 1

## 2021-09-04 MED ORDER — OXYCODONE HCL 5 MG PO TABS
10.0000 mg | ORAL_TABLET | ORAL | Status: DC
  Administered 2021-09-04 – 2021-09-05 (×7): 10 mg via ORAL
  Filled 2021-09-04 (×7): qty 2
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
## 2021-09-05 MED ORDER — OXYCODONE HCL 10 MG PO TABS: 10.0000 mg | ORAL_TABLET | ORAL | 0 refills | Status: DC

## 2021-09-05 MED ORDER — NICOTINE 21 MG/24HR TD PT24: 21.0000 mg | MEDICATED_PATCH | Freq: Every day | TRANSDERMAL | 0 refills | Status: DC

## 2021-09-05 MED ORDER — SENNOSIDES-DOCUSATE SODIUM 8.6-50 MG PO TABS: 1.0000 | ORAL_TABLET | Freq: Two times a day (BID) | ORAL | 0 refills | Status: DC

## 2021-09-05 MED ORDER — DIAZEPAM 5 MG PO TABS: 5.0000 mg | ORAL_TABLET | Freq: Four times a day (QID) | ORAL | 0 refills | Status: DC

## 2021-09-05 MED ORDER — GABAPENTIN 400 MG PO CAPS: 400.0000 mg | ORAL_CAPSULE | Freq: Three times a day (TID) | ORAL | 2 refills | Status: DC
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
## 2021-09-05 NOTE — TOC CAGE-AID Note (Signed)
Transition of Care [facility]) - CAGE-AID Screening   Patient Details  Name: Jessica Peters MRN: [id number] Date of Birth: 06/25/1981  Transition of Care [facility]) CM/SW Contact:    Chantay Whitelock C Tarpley-Carter, [place] Phone Number: 09/05/2021, 10:07 AM   Clinical Narrative: Pt participated in [place].  Pt stated she does not use substance, but drinks ETOH.  Pt was offered resources, pt accepted.  Mathilde Mcwherter Tarpley-Carter, MSW, LCSW-A Pronouns:  She/Her/Hers [facility] HealthTransitions of Care Clinical Social Worker Direct Number:  [phone] [email or web address]  CAGE-AID Screening:    Have You Ever Felt You Ought to Cut Down on Your Drinking or Drug Use?: Yes Have People Annoyed You By Critizing Your Drinking Or Drug Use?: No Have You Felt Bad Or Guilty About Your Drinking Or Drug Use?: Yes Have You Ever Had a Drink or Used Drugs First Thing In The Morning to Steady Your Nerves or to Get Rid of a Hangover?: No CAGE-AID Score: 2  Substance Abuse Education Offered: Yes  Substance abuse interventions: Scientist, clinical (histocompatibility and immunogenetics)

## 2021-09-05 NOTE — Progress Notes (Signed)
Pt discharged home. Pt educated on discharge material, all room belongings packed, 2 PIVs removed. Pt taken to private vehicle in wheelchair with all belongings.   Justice Rocher, RN
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
## 2021-09-06 ENCOUNTER — Telehealth: Payer: Self-pay

## 2021-09-06 NOTE — Telephone Encounter (Signed)
Transition Care Management Follow-up Telephone Call Date of discharge and from where: 09/05/2021 from [facility]  How have you been since you were released from the hospital? Pt stated that she is comfortable. Pt did not have any questions or concerns.  Any questions or concerns? No  Items Reviewed: Did the pt receive and understand the discharge instructions provided? Yes  Medications obtained and verified? Yes  Other? No  Any new allergies since your discharge? No  Dietary orders reviewed? No Do you have support at home? Yes   Functional Questionnaire: (I = Independent and D = Dependent) ADLs: I  Bathing/Dressing- I  Meal Prep- I  Eating- I  Maintaining continence- I  Transferring/Ambulation- I  Managing Meds- I   Follow up appointments reviewed:  PCP Hospital f/u appt confirmed? No   Specialist Hospital f/u appt confirmed? No  Pt is calling for follow up appt.  Are transportation arrangements needed? No  If their condition worsens, is the pt aware to call PCP or go to the Emergency Dept.? Yes Was the patient provided with contact information for the PCP's office or ED? Yes Was to pt encouraged to call back with questions or concerns? Yes

## 2021-09-18 DIAGNOSIS — M50021 Cervical disc disorder at C4-C5 level with myelopathy: Secondary | ICD-10-CM | POA: Diagnosis not present

## 2021-09-26 ENCOUNTER — Encounter: Payer: Self-pay | Admitting: Family Medicine

## 2021-09-26 ENCOUNTER — Telehealth (INDEPENDENT_AMBULATORY_CARE_PROVIDER_SITE_OTHER): Payer: Medicaid Other | Admitting: Family Medicine

## 2021-09-26 DIAGNOSIS — S14109A Unspecified injury at unspecified level of cervical spinal cord, initial encounter: Secondary | ICD-10-CM

## 2021-09-26 DIAGNOSIS — F4310 Post-traumatic stress disorder, unspecified: Secondary | ICD-10-CM
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
## 2021-10-02 ENCOUNTER — Encounter: Payer: Self-pay | Admitting: Family Medicine

## 2021-10-11 ENCOUNTER — Encounter: Payer: Self-pay | Admitting: Family Medicine

## 2021-10-11 DIAGNOSIS — F4310 Post-traumatic stress disorder, unspecified: Secondary | ICD-10-CM | POA: Insufficient documentation

## 2021-10-11 MED ORDER — LIDOCAINE 5 % EX OINT: 1.0000 "application " | TOPICAL_OINTMENT | CUTANEOUS | 12 refills | Status: DC | PRN

## 2021-10-11 NOTE — Assessment & Plan Note (Signed)
Continue to follow with neurosurgery. Continue to monitor. Work on pain control. Call with any concerns.
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
## 2021-10-12 ENCOUNTER — Other Ambulatory Visit: Payer: Medicaid Other | Admitting: *Deleted

## 2021-10-12 ENCOUNTER — Other Ambulatory Visit: Payer: Self-pay

## 2021-10-12 DIAGNOSIS — Z599 Problem related to housing and economic circumstances, unspecified: Secondary | ICD-10-CM

## 2021-10-12 NOTE — Progress Notes (Signed)
Pt scheduled appt this morning via mychart
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
## 2021-10-12 NOTE — Patient Instructions (Signed)
Visit Information  Ms. Henkin was given information about Medicaid Managed Care team care coordination services as a part of their Healthy [facility] Medicaid benefit. [name] verbally consented to engagement with the [facility] Managed Care team.   If you are experiencing a medical emergency, please call 911 or report to your local emergency department or urgent care.   If you have a non-emergency medical problem during routine business hours, please contact your provider's office and ask to speak with a nurse.   For questions related to your Healthy [facility] health plan, please call: [phone] or visit the homepage here: [email or web address]  If you would like to schedule transportation through your Healthy [facility] plan, please call the following number at least 2 days in advance of your appointment: [phone]  Call the [place] at [phone], at any time, 24 hours a day, 7 days a week. If you are in danger or need immediate medical attention call 911.  If you would like help to quit smoking, call 1-800-QUIT-NOW 513-274-2564) OR Espaol: 1-855-Djelo-Ya (1-941-740-8144) o para ms informacin haga clic aqu or Text READY to 200-400 to register via text  Ms. Dame - following are the goals we discussed in your visit today:   Goals Addressed   None     Please see education materials related to pain and PTSD provided by MyChart link.  Patient has access to MyChart and can view provided education  Telephone follow up appointment with Managed Medicaid care management team member scheduled for:10/26/21 @ [place] RN, BSN [place] RN Care Coordinator   Following is a copy of your plan of care:  Care Plan : RN Care Manager Plan of Care  Updates made by Melissa Montane, RN since 10/12/2021 12:00 AM     Problem: Knowledge Deficits and Care Coordination needs related  to management of pain   Priority: High     Long-Range Goal: Development of Plan of Care to address Care Coordination needs and knowledge deficits related to managing chronic pain   Start Date: 10/12/2021  Expected End Date: 12/11/2021  Priority: High  Note:   Current Barriers:  Knowledge Deficits related to plan of care for management of PTSD and chronic pain  Care Coordination needs related to Financial constraints related to affording utilities and Lacks knowledge of community resource: needing a dental provider    RNCM Clinical Goal(s):  Patient will verbalize understanding of plan for management of Pain and PTSD as evidenced by patient verbalization and self monitoring activity take all medications exactly as prescribed and will call provider for medication related questions as evidenced by documentation in EMR    attend all scheduled medical appointments: 10/15/21 with LCSW, Jerene Pitch and 10/17/21 with Dr. Wynetta Emery as evidenced by documentation in EMR        continue to work with RN Care Manager and/or Social Worker to address care management and care coordination needs related to pain and PTSD as evidenced by adherence to CM Team Scheduled appointments     work with [employer] care guide to address needs related to Financial constraints related to affording utilities and Lacks knowledge of community resource: needing dental provider as evidenced by patient and/or community resource care guide support    through collaboration with Consulting civil engineer, provider, and care team.   Interventions: Inter-disciplinary care team collaboration (see longitudinal plan of care) Evaluation of current treatment plan related to  self management and patient's adherence  to plan as established by provider Collaborate with LCSW for assistance with managing PTSD-appointment made for 10/15/21 at 1:30pm with Jerene Pitch Provided therapeutic listening Referral placed for MM Care Guide for assistance with utilities  and finding a dental provider   Pain:  (Status: New goal.) Long Term Goal  Pain assessment performed Medications reviewed Reviewed provider established plan for pain management; Discussed importance of adherence to all scheduled medical appointments; Counseled on the importance of reporting any/all new or changed pain symptoms or management strategies to pain management provider; Advised patient to report to care team affect of pain on daily activities; Discussed use of relaxation techniques and/or diversional activities to assist with pain reduction (distraction, imagery, relaxation, massage, acupressure, TENS, heat, and cold application; Reviewed with patient prescribed pharmacological and nonpharmacological pain relief strategies; Assessed social determinant of health barriers;  Discussed the importance of a healthy diet for healing, examples provided Discussed relaxation techniques to help with nerve pain  Patient Goals/Self-Care Activities: Patient will self administer medications as prescribed as evidenced by self report/primary caregiver report  Patient will attend all scheduled provider appointments as evidenced by clinician review of documented attendance to scheduled appointments and patient/caregiver report Patient will call pharmacy for medication refills as evidenced by patient report and review of pharmacy fill history as appropriate Patient will continue to perform ADL's independently as evidenced by patient/caregiver report Patient will continue to perform IADL's independently as evidenced by patient/caregiver report Patient will call provider office for new concerns or questions as evidenced by review of documented incoming telephone call notes and patient report

## 2021-10-15 ENCOUNTER — Other Ambulatory Visit: Payer: Self-pay

## 2021-10-15 NOTE — Patient Instructions (Signed)
[name] ,   The [facility] Managed Care Team is available to provide assistance to you with your healthcare needs at no cost and as a benefit of your [employer] Health plan. I'm sorry I was unable to reach you today for our scheduled appointment. Our care guide will call you to reschedule our telephone appointment. Please call me at the number below. I am available to be of assistance to you regarding your healthcare needs. .   Thank you,   Eula Fried, BSW, MSW, LCSW Managed Medicaid [email or web address] Phone: [phone]

## 2021-10-15 NOTE — Patient Outreach (Signed)
[facility]) Care Management  10/15/2021  BRIYANA BADMAN 20-Jul-1981 [id number]   LCSW completed [facility] outreach attempt today during scheduled appointment time but was unable to reach patient successfully. A HIPPA compliant voice message was unable to be left. LCSW will ask Scheduling Care Guide to reschedule [facility] SW appointment with patient as well.  Eula Fried, BSW, MSW, [employer] Managed Medicaid LCSW [email or web address] Phone: [phone]

## 2021-10-17 ENCOUNTER — Other Ambulatory Visit: Payer: Self-pay

## 2021-10-17 ENCOUNTER — Ambulatory Visit
Admission: RE | Admit: 2021-10-17 | Discharge: 2021-10-17 | Disposition: A | Discharge status: Home / Self Care | Payer: Medicaid Other | Source: Ambulatory Visit | Attending: Family Medicine | Admitting: Family Medicine

## 2021-10-17 ENCOUNTER — Encounter: Payer: Self-pay | Admitting: Family Medicine

## 2021-10-17 ENCOUNTER — Other Ambulatory Visit: Payer: Self-pay | Admitting: Family Medicine

## 2021-10-17 ENCOUNTER — Ambulatory Visit
Admission: RE | Admit: 2021-10-17 | Discharge: 2021-10-17 | Disposition: A | Discharge status: Home / Self Care | Payer: Medicaid Other | Attending: Family Medicine | Admitting: Family Medicine

## 2021-10-17 ENCOUNTER — Telehealth (INDEPENDENT_AMBULATORY_CARE_PROVIDER_SITE_OTHER): Payer: Medicaid Other | Admitting: Family Medicine

## 2021-10-17 DIAGNOSIS — M25531 Pain in right wrist: Secondary | ICD-10-CM

## 2021-10-17 DIAGNOSIS — S14109A Unspecified injury at unspecified level of cervical spinal cord, initial encounter: Secondary | ICD-10-CM | POA: Diagnosis not present

## 2021-10-17 DIAGNOSIS — M25521 Pain in right elbow: Secondary | ICD-10-CM

## 2021-10-17 DIAGNOSIS — F4310 Post-traumatic stress disorder, unspecified: Secondary | ICD-10-CM
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
## 2021-10-17 NOTE — Patient Instructions (Addendum)
Call to schedule your psych appointment:  [facility], [address], [place], [place] [address] [phone]   You can walk into [facility] for counseling and to be seen [address] [place], [place] [address] Phone: [phone] Monday, Wednesday and Friday, 8am - 3pm

## 2021-10-17 NOTE — Assessment & Plan Note (Signed)
Still not feeling well. Has not seen neurosurgery since her last appointment. To follow up with them in a couple of weeks.

## 2021-10-17 NOTE — Assessment & Plan Note (Signed)
Not doing well. Encouraged her to call psychiatry and if she feels like she can't wait until their appointment to go to [place]. Information and walk in hours given today. Call with any concerns. Continue to monitor.
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
## 2021-10-22 ENCOUNTER — Telehealth: Payer: Self-pay | Admitting: Family Medicine

## 2021-10-22 NOTE — Progress Notes (Signed)
Lmom for patient to schedule f/u appointment.

## 2021-10-22 NOTE — Telephone Encounter (Signed)
..   Medicaid Managed Care   Unsuccessful Outreach Note  10/22/2021 Name: Jessica Peters MRN: [id number] DOB: 09-10-1981  Referred by: Valerie Roys, DO Reason for referral : High Risk Managed Medicaid (I called the patient today to see if she wanted to reschedule her phone visit with the LCSW that she missed on 11/21.I had to leave my name and number on her VM.)   An unsuccessful telephone outreach was attempted today. The patient was referred to the case management team for assistance with care management and care coordination.   Follow Up Plan: The care management team will reach out to the patient again over the next 7 days.    [place]

## 2021-10-22 NOTE — Progress Notes (Signed)
Follow up appointment scheduled.

## 2021-10-24 ENCOUNTER — Encounter: Payer: Self-pay | Admitting: Family Medicine

## 2021-10-26 ENCOUNTER — Other Ambulatory Visit: Payer: Self-pay | Admitting: *Deleted

## 2021-10-26 NOTE — Patient Outreach (Signed)
Care Coordination  10/26/2021  MERCY LEPPLA 25-Oct-1981 [id number]   Medicaid Managed Care   Unsuccessful Outreach Note  10/26/2021 Name: Jessica Peters MRN: [id number] DOB: 11/07/1981  Referred by: Valerie Roys, DO Reason for referral : High Risk Managed Medicaid (Unsuccessful RNCM follow up outreach)   An unsuccessful telephone outreach was attempted today. The patient was referred to the case management team for assistance with care management and care coordination.   Follow Up Plan: A HIPAA compliant phone message was left for the patient providing contact information and requesting a return call.   [name] RN, BSN [place]  [employer] manager

## 2021-10-26 NOTE — Patient Instructions (Signed)
Visit Information  Ms. Teniqua K Weldon  - as a part of your Medicaid benefit, you are eligible for care management and care coordination services at no cost or copay. I was unable to reach you by phone today but would be happy to help you with your health related needs. Please feel free to call me @ [phone].   A member of the Managed Medicaid care management team will reach out to you again over the next 14 days.   Abbas Beyene RN, BSN [place]  [facility] RN Care Coordinator   

## 2021-10-29 DIAGNOSIS — G959 Disease of spinal cord, unspecified: Secondary | ICD-10-CM | POA: Diagnosis not present

## 2021-10-29 DIAGNOSIS — S13171D Dislocation of C6/C7 cervical vertebrae, subsequent encounter: Secondary | ICD-10-CM | POA: Diagnosis not present

## 2021-10-29 DIAGNOSIS — M4312 Spondylolisthesis, cervical region: Secondary | ICD-10-CM | POA: Diagnosis not present

## 2021-10-30 ENCOUNTER — Encounter: Payer: Self-pay | Admitting: Family Medicine

## 2021-10-31 ENCOUNTER — Encounter: Payer: Self-pay | Admitting: Family Medicine

## 2021-11-02 ENCOUNTER — Other Ambulatory Visit: Payer: Self-pay

## 2021-11-02 ENCOUNTER — Encounter: Payer: Self-pay | Admitting: Family Medicine

## 2021-11-02 ENCOUNTER — Other Ambulatory Visit: Payer: Self-pay | Admitting: *Deleted

## 2021-11-02 MED ORDER — OMEPRAZOLE 20 MG PO CPDR: 20.0000 mg | DELAYED_RELEASE_CAPSULE | Freq: Every day | ORAL | 3 refills | Status: DC
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
## 2021-11-05 ENCOUNTER — Encounter: Payer: Self-pay | Admitting: Family Medicine

## 2021-11-05 ENCOUNTER — Telehealth (INDEPENDENT_AMBULATORY_CARE_PROVIDER_SITE_OTHER): Payer: Medicaid Other | Admitting: Family Medicine

## 2021-11-05 DIAGNOSIS — J069 Acute upper respiratory infection, unspecified: Secondary | ICD-10-CM

## 2021-11-05 MED ORDER — PREDNISONE 50 MG PO TABS: 50.0000 mg | ORAL_TABLET | Freq: Every day | ORAL | 0 refills | Status: DC
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
## 2021-11-05 NOTE — Telephone Encounter (Signed)
Virtual appt OK to book at 1PM or 4PM- whichever one the other one is not booked at

## 2021-11-06 ENCOUNTER — Telehealth: Payer: Self-pay | Admitting: *Deleted

## 2021-11-06 NOTE — Telephone Encounter (Signed)
° °  Telephone encounter was:  Unsuccessful.  11/06/2021 Name: TANGELIA SANSON MRN: [id number] DOB: 07-04-1981  Unsuccessful outbound call made today to assist with:   utilities and dental provides   Outreach Attempt:  1st Attempt  A HIPAA compliant voice message was left requesting a return call.  Instructed patient to call back at   Instructed patient to call back at [phone]  at their earliest convenience.   [place], Care Management  [phone] [address] , [place] [address] Email : [email or web address]

## 2021-11-08 ENCOUNTER — Telehealth: Payer: Self-pay | Admitting: *Deleted

## 2021-11-08 NOTE — Telephone Encounter (Signed)
° °  Telephone encounter was:  Unsuccessful.  11/08/2021 Name: Jessica Peters MRN: [id number] DOB: 1981-03-27  Unsuccessful outbound call made today to assist with:  Food Insecurity  Outreach Attempt:  2nd Attempt  A HIPAA compliant voice message was left requesting a return call.  Instructed patient to call back at   Instructed patient to call back at [phone]  at their earliest convenience.   [place], Care Management  [phone] [address] , [place] [address] Email : [email or web address]

## 2021-11-09 ENCOUNTER — Telehealth: Payer: Self-pay | Admitting: *Deleted

## 2021-11-14 ENCOUNTER — Telehealth (INDEPENDENT_AMBULATORY_CARE_PROVIDER_SITE_OTHER): Payer: Medicaid Other | Admitting: Family Medicine

## 2021-11-14 ENCOUNTER — Encounter: Payer: Self-pay | Admitting: Family Medicine

## 2021-11-14 DIAGNOSIS — F4310 Post-traumatic stress disorder, unspecified: Secondary | ICD-10-CM | POA: Diagnosis not present

## 2021-11-14 MED ORDER — DULOXETINE HCL 20 MG PO CPEP: 20.0000 mg | ORAL_CAPSULE | Freq: Every day | ORAL | 3 refills | Status: DC

## 2021-11-14 MED ORDER — TERBINAFINE HCL 1 % EX CREA: 1.0000 "application " | TOPICAL_CREAM | Freq: Two times a day (BID) | CUTANEOUS | 0 refills | Status: DC
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
## 2021-11-14 NOTE — Assessment & Plan Note (Signed)
Has not been able to get into psychiatry yet. Will get her started on cymbalta and recheck 2 weeks. Call with any concerns. Continue to monitor.

## 2021-11-16 ENCOUNTER — Other Ambulatory Visit: Payer: Self-pay | Admitting: *Deleted

## 2021-11-16 NOTE — Patient Outreach (Signed)
Care Coordination  11/16/2021  [name] 12-11-1980 [id number]   Medicaid Managed Care   Unsuccessful Outreach Note  11/16/2021 Name: [name] MRN: [id number] DOB: [birth year]  Referred by: Valerie Roys, DO Reason for referral : High Risk Managed Medicaid (Unsuccessful RNCM follow up outreach)   An unsuccessful telephone outreach was attempted today. The patient was referred to the case management team for assistance with care management and care coordination.   Follow Up Plan: A HIPAA compliant phone message was left for the patient providing contact information and requesting a return call.   [name] RN, BSN [place] RN Care Coordinator

## 2021-11-16 NOTE — Patient Instructions (Signed)
Visit Information  Ms. [name]  - as a part of your Medicaid benefit, you are eligible for care management and care coordination services at no cost or copay. I was unable to reach you by phone today but would be happy to help you with your health related needs. Please feel free to call me @ [phone].   A member of the Managed Medicaid care management team will reach out to you again over the next 7 days.   [name] RN, BSN [place] RN Care Coordinator

## 2021-11-20 ENCOUNTER — Other Ambulatory Visit: Payer: Self-pay | Admitting: Family Medicine

## 2021-11-20 ENCOUNTER — Other Ambulatory Visit: Payer: Self-pay | Admitting: *Deleted

## 2021-11-20 ENCOUNTER — Other Ambulatory Visit: Payer: Self-pay

## 2021-11-20 MED ORDER — TERBINAFINE HCL 1 % EX CREA: 1.0000 "application " | TOPICAL_CREAM | Freq: Two times a day (BID) | CUTANEOUS | 0 refills | Status: DC
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
## 2021-11-29 ENCOUNTER — Encounter: Payer: Self-pay | Admitting: Family Medicine

## 2021-11-29 ENCOUNTER — Telehealth (INDEPENDENT_AMBULATORY_CARE_PROVIDER_SITE_OTHER): Payer: Medicaid Other | Admitting: Family Medicine

## 2021-11-29 VITALS — Wt 120.0 lb

## 2021-11-29 DIAGNOSIS — F4310 Post-traumatic stress disorder, unspecified: Secondary | ICD-10-CM

## 2021-11-29 DIAGNOSIS — S14109A Unspecified injury at unspecified level of cervical spinal cord, initial encounter: Secondary | ICD-10-CM | POA: Diagnosis not present

## 2021-11-29 DIAGNOSIS — M532X2 Spinal instabilities, cervical region: Secondary | ICD-10-CM | POA: Diagnosis not present

## 2021-11-29 DIAGNOSIS — F339 Major depressive disorder, recurrent, unspecified: Secondary | ICD-10-CM

## 2021-11-29 DIAGNOSIS — F419 Anxiety disorder, unspecified: Secondary | ICD-10-CM | POA: Diagnosis not present

## 2021-11-29 DIAGNOSIS — E46 Unspecified protein-calorie malnutrition: Secondary | ICD-10-CM

## 2021-11-29 DIAGNOSIS — R634 Abnormal weight loss: Secondary | ICD-10-CM | POA: Diagnosis not present

## 2021-11-29 DIAGNOSIS — R636 Underweight: Secondary | ICD-10-CM

## 2021-11-29 MED ORDER — CICLOPIROX 8 % EX SOLN: Freq: Every day | CUTANEOUS | 0 refills | Status: DC

## 2021-11-29 MED ORDER — ENSURE PLUS HIGH PROTEIN PO LIQD: 1.0000 | Freq: Two times a day (BID) | ORAL | 6 refills | Status: DC

## 2021-11-29 MED ORDER — MIRTAZAPINE 15 MG PO TABS: 15.0000 mg | ORAL_TABLET | Freq: Every day | ORAL | 2 refills | Status: DC

## 2021-11-29 NOTE — Assessment & Plan Note (Signed)
Not doing well. Seeing psychiatry in about 3 weeks. Tolerating the cymbalta OK. Having a lot of weight loss. Will continue current cymbalta and start remeron to help with appetite. Await psych input. Continue to monitor closely.

## 2021-11-29 NOTE — Assessment & Plan Note (Signed)
Would like to get 2nd opinion from neurosurgery- referral generated today.

## 2021-11-29 NOTE — Progress Notes (Signed)
Wt 120 lb (54.4 kg) Comment: Patient reported   LMP 11/29/2015 (Exact Date)    BMI 17.22 kg/m    Subjective:    Patient ID: Jessica Peters, female    DOB: February 28, 1981, 41 y.o.   MRN: [id number]  HPI: Jessica Peters is a 41 y.o. female  Chief Complaint  Patient presents with   Medication Management    Patient states she is here to follow up on anti-depressant. Patient states the medication makes her nauseous. Patient states she currently take the medication at night along with her other medications. Patient states it has been hard for her getting refill on her medications from her surgery. Patient would like to discuss if provider can take over the management of medication due to surgeons being in surgery and not being able to get her refills. Patient states she fell the other day.    Depression   DEPRESSION Mood status: uncontrolled Satisfied with current treatment?: no Symptom severity: severe  Duration of current treatment :  1 month Side effects: yes- nausea Medication compliance: excellent compliance Psychotherapy/counseling: no  Depressed mood: yes Anxious mood: yes Anhedonia: yes Significant weight loss or gain: yes Insomnia: yes  Fatigue: yes Feelings of worthlessness or guilt: yes Impaired concentration/indecisiveness: yes Suicidal ideations: no Hopelessness: yes Crying spells: yes Depression screen [facility] 2/9 11/14/2021 11/02/2021 12/26/2020 05/18/2020 05/04/2019  Decreased Interest 3 1 2 2  0  Down, Depressed, Hopeless 3 3 2 2  0  PHQ - 2 Score 6 4 4 4  0  Altered sleeping 3 - 3 3 2   Tired, decreased energy 3 - 2 2 2   Change in appetite 3 - 2 3 0  Feeling bad or failure about yourself  3 - 2 1 0  Trouble concentrating 3 - 2 1 0  Moving slowly or fidgety/restless 0 - 2 1 0  Suicidal thoughts - - 0 0 0  PHQ-9 Score 21 - 17 15 4   Difficult doing work/chores Very difficult - - - Not difficult at all    Relevant past medical, surgical, family and social history  reviewed and updated as indicated. Interim medical history since our last visit reviewed. Allergies and medications reviewed and updated.  Review of Systems  Constitutional:  Positive for appetite change and fatigue. Negative for activity change, chills, diaphoresis, fever and unexpected weight change.  HENT: Negative.    Respiratory: Negative.    Cardiovascular: Negative.   Gastrointestinal: Negative.   Musculoskeletal: Negative.   Neurological: Negative.   Psychiatric/Behavioral: Negative.     Per HPI unless specifically indicated above     Objective:    Wt 120 lb (54.4 kg) Comment: Patient reported   LMP 11/29/2015 (Exact Date)    BMI 17.22 kg/m   Wt Readings from Last 3 Encounters:  11/29/21 120 lb (54.4 kg)  09/01/21 151 lb 0.2 oz (68.5 kg)  01/04/21 160 lb (72.6 kg)    Physical Exam Vitals and nursing note reviewed.  Constitutional:      General: She is not in acute distress.    Appearance: Normal appearance. She is not ill-appearing, toxic-appearing or diaphoretic.  HENT:     Head: Normocephalic and atraumatic.     Right Ear: External ear normal.     Left Ear: External ear normal.     Nose: Nose normal.     Mouth/Throat:     Mouth: Mucous membranes are moist.     Pharynx: Oropharynx is clear.  Eyes:     General: No scleral  icterus.       Right eye: No discharge.        Left eye: No discharge.     Conjunctiva/sclera: Conjunctivae normal.     Pupils: Pupils are equal, round, and reactive to light.  Pulmonary:     Effort: Pulmonary effort is normal. No respiratory distress.     Comments: Speaking in full sentences Musculoskeletal:     Cervical back: Normal range of motion.     Comments: Cervical collar on  Skin:    Coloration: Skin is not jaundiced or pale.     Findings: No bruising, erythema, lesion or rash.  Neurological:     Mental Status: She is alert and oriented to person, place, and time. Mental status is at baseline.  Psychiatric:        Mood and  Affect: Mood is anxious and depressed.        Behavior: Behavior normal.        Thought Content: Thought content normal.        Judgment: Judgment normal.    Results for orders placed or performed during the hospital encounter of 09/01/21  Resp Panel by RT-PCR (Flu A&B, Covid) Nasopharyngeal Swab   Specimen: Nasopharyngeal Swab; Nasopharyngeal(NP) swabs in vial transport medium  Result Value Ref Range   SARS Coronavirus 2 by RT PCR NEGATIVE NEGATIVE   Influenza A by PCR NEGATIVE NEGATIVE   Influenza B by PCR NEGATIVE NEGATIVE  Surgical pcr screen   Specimen: Nasal Mucosa; Nasal Swab  Result Value Ref Range   MRSA, PCR NEGATIVE NEGATIVE   Staphylococcus aureus NEGATIVE NEGATIVE  Comprehensive metabolic panel  Result Value Ref Range   Sodium 139 135 - 145 mmol/L   Potassium 3.4 (L) 3.5 - 5.1 mmol/L   Chloride 106 98 - 111 mmol/L   CO2 22 22 - 32 mmol/L   Glucose, Bld 91 70 - 99 mg/dL   BUN 6 6 - 20 mg/dL   Creatinine, Ser 0.66 0.44 - 1.00 mg/dL   Calcium 8.8 (L) 8.9 - 10.3 mg/dL   Total Protein 6.9 6.5 - 8.1 g/dL   Albumin 4.3 3.5 - 5.0 g/dL   AST 32 15 - 41 U/L   ALT 19 0 - 44 U/L   Alkaline Phosphatase 19 (L) 38 - 126 U/L   Total Bilirubin 0.8 0.3 - 1.2 mg/dL   GFR, Estimated >60 >60 mL/min   Anion gap 11 5 - 15  CBC  Result Value Ref Range   WBC 11.8 (H) 4.0 - 10.5 K/uL   RBC 4.64 3.87 - 5.11 MIL/uL   Hemoglobin 14.5 12.0 - 15.0 g/dL   HCT 44.1 36.0 - 46.0 %   MCV 95.0 80.0 - 100.0 fL   MCH 31.3 26.0 - 34.0 pg   MCHC 32.9 30.0 - 36.0 g/dL   RDW 11.9 11.5 - 15.5 %   Platelets 132 (L) 150 - 400 K/uL   nRBC 0.0 0.0 - 0.2 %  Ethanol  Result Value Ref Range   Alcohol, Ethyl (B) 256 (H) <10 mg/dL  Urinalysis, Routine w reflex microscopic  Result Value Ref Range   Color, Urine COLORLESS (A) YELLOW   APPearance CLEAR CLEAR   Specific Gravity, Urine 1.003 (L) 1.005 - 1.030   pH 6.0 5.0 - 8.0   Glucose, UA NEGATIVE NEGATIVE mg/dL   Hgb urine dipstick MODERATE (A)  NEGATIVE   Bilirubin Urine NEGATIVE NEGATIVE   Ketones, ur NEGATIVE NEGATIVE mg/dL   Protein, ur NEGATIVE NEGATIVE mg/dL  Nitrite NEGATIVE NEGATIVE   Leukocytes,Ua NEGATIVE NEGATIVE   RBC / HPF 0-5 0 - 5 RBC/hpf   WBC, UA 0-5 0 - 5 WBC/hpf   Bacteria, UA NONE SEEN NONE SEEN   Squamous Epithelial / LPF 0-5 0 - 5   Mucus PRESENT   Lactic acid, plasma  Result Value Ref Range   Lactic Acid, Venous 2.1 (HH) 0.5 - 1.9 mmol/L  Protime-INR  Result Value Ref Range   Prothrombin Time 13.7 11.4 - 15.2 seconds   INR 1.1 0.8 - 1.2  HIV Antibody (routine testing w rflx)  Result Value Ref Range   HIV Screen 4th Generation wRfx Non Reactive Non Reactive  CBC  Result Value Ref Range   WBC 13.8 (H) 4.0 - 10.5 K/uL   RBC 3.87 3.87 - 5.11 MIL/uL   Hemoglobin 11.9 (L) 12.0 - 15.0 g/dL   HCT 36.0 36.0 - 46.0 %   MCV 93.0 80.0 - 100.0 fL   MCH 30.7 26.0 - 34.0 pg   MCHC 33.1 30.0 - 36.0 g/dL   RDW 12.1 11.5 - 15.5 %   Platelets 126 (L) 150 - 400 K/uL   nRBC 0.0 0.0 - 0.2 %  Basic Metabolic Panel  Result Value Ref Range   Sodium 133 (L) 135 - 145 mmol/L   Potassium 3.8 3.5 - 5.1 mmol/L   Chloride 102 98 - 111 mmol/L   CO2 24 22 - 32 mmol/L   Glucose, Bld 107 (H) 70 - 99 mg/dL   BUN 8 6 - 20 mg/dL   Creatinine, Ser 0.68 0.44 - 1.00 mg/dL   Calcium 7.9 (L) 8.9 - 10.3 mg/dL   GFR, Estimated >60 >60 mL/min   Anion gap 7 5 - 15  I-Stat Chem 8, ED  Result Value Ref Range   Sodium 143 135 - 145 mmol/L   Potassium 3.4 (L) 3.5 - 5.1 mmol/L   Chloride 104 98 - 111 mmol/L   BUN 7 6 - 20 mg/dL   Creatinine, Ser 1.00 0.44 - 1.00 mg/dL   Glucose, Bld 87 70 - 99 mg/dL   Calcium, Ion 1.09 (L) 1.15 - 1.40 mmol/L   TCO2 25 22 - 32 mmol/L   Hemoglobin 15.3 (H) 12.0 - 15.0 g/dL   HCT 45.0 36.0 - 46.0 %  I-Stat beta hCG blood, ED  Result Value Ref Range   I-stat hCG, quantitative <5.0 <5 mIU/mL   Comment 3          Sample to Blood Bank  Result Value Ref Range   Blood Bank Specimen SAMPLE AVAILABLE  FOR TESTING    Sample Expiration      09/02/2021,2359 Performed at [facility], [address], [place], [place] [address]       Assessment & Plan:   Problem List Items Addressed This Visit       Musculoskeletal and Integument   Acute traumatic injury of cervical spine ([place])    Would like to get 2nd opinion from neurosurgery- referral generated today.      Relevant Orders   Ambulatory referral to Neurosurgery     Other   Anxiety    Not doing well. Seeing psychiatry in about 3 weeks. Tolerating the cymbalta OK. Having a lot of weight loss. Will continue current cymbalta and start remeron to help with appetite. Await psych input. Continue to monitor closely.      Relevant Medications   mirtazapine (REMERON) 15 MG tablet   Depression, recurrent ([place])  Not doing well. Seeing psychiatry in about 3 weeks. Tolerating the cymbalta OK. Having a lot of weight loss. Will continue current cymbalta and start remeron to help with appetite. Await psych input. Continue to monitor closely.      Relevant Medications   mirtazapine (REMERON) 15 MG tablet   Unstable cervical spine    Would like to get 2nd opinion from neurosurgery- referral generated today.      Relevant Orders   Ambulatory referral to Neurosurgery   PTSD (post-traumatic stress disorder) - Primary    Not doing well. Seeing psychiatry in about 3 weeks. Tolerating the cymbalta OK. Having a lot of weight loss. Will continue current cymbalta and start remeron to help with appetite. Await psych input. Continue to monitor closely.      Relevant Medications   mirtazapine (REMERON) 15 MG tablet   Other Visit Diagnoses     Weight loss       Will start ensure and rememeron. Continue to mointor closely.   Relevant Medications   Nutritional Supplements (ENSURE PLUS HIGH PROTEIN) LIQD   Underweight       Relevant Medications   Nutritional Supplements (ENSURE PLUS HIGH PROTEIN) LIQD   Protein-calorie malnutrition,  unspecified severity (HCC)       Relevant Medications   Nutritional Supplements (ENSURE PLUS HIGH PROTEIN) LIQD        Follow up plan: Return 2-4 weeks.   This visit was completed via video visit through MyChart due to the restrictions of the COVID-19 pandemic. All issues as above were discussed and addressed. Physical exam was done as above through visual confirmation on video through MyChart. If it was felt that the patient should be evaluated in the office, they were directed there. The patient verbally consented to this visit. Location of the patient: home Location of the provider: work Those involved with this call:  Provider: Park Liter, DO CMA: Irena Reichmann, Catharine Desk/Registration: [employer]  Time spent on call:  15 minutes with patient face to face via video conference. More than 50% of this time was spent in counseling and coordination of care. 23 minutes total spent in review of patient's record and preparation of their chart.

## 2021-12-05 ENCOUNTER — Encounter: Payer: Self-pay | Admitting: Family Medicine

## 2021-12-06 NOTE — Telephone Encounter (Signed)
She can upload it to [place] and I'm happy to take a look at it.

## 2021-12-07 ENCOUNTER — Other Ambulatory Visit: Payer: Self-pay | Admitting: *Deleted

## 2021-12-07 NOTE — Patient Instructions (Signed)
Visit Information  Ms. Rylann K Desantiago  - as a part of your Medicaid benefit, you are eligible for care management and care coordination services at no cost or copay. I was unable to reach you by phone today but would be happy to help you with your health related needs. Please feel free to call me @ [phone].   A member of the Managed Medicaid care management team will reach out to you again over the next 14 days.   Timtohy Broski RN, BSN [place]  [facility] RN Care Coordinator   

## 2021-12-07 NOTE — Patient Outreach (Signed)
Care Coordination  12/07/2021  Jessica Peters June 04, 1981 [id number]   Medicaid Managed Care   Unsuccessful Outreach Note  12/07/2021 Name: Jessica Peters MRN: [id number] DOB: 31-May-1981  Referred by: Valerie Roys, DO Reason for referral : High Risk Managed Medicaid (Unsuccessful RNCM follow up outreach)   An unsuccessful telephone outreach was attempted today. The patient was referred to the case management team for assistance with care management and care coordination.   Follow Up Plan: A HIPAA compliant phone message was left for the patient providing contact information and requesting a return call.   [name] RN, BSN [place] RN Care Coordinator

## 2021-12-10 ENCOUNTER — Other Ambulatory Visit: Payer: Self-pay | Admitting: *Deleted

## 2021-12-10 ENCOUNTER — Other Ambulatory Visit: Payer: Self-pay
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
## 2021-12-12 DIAGNOSIS — G959 Disease of spinal cord, unspecified: Secondary | ICD-10-CM | POA: Diagnosis not present

## 2021-12-12 DIAGNOSIS — S13171D Dislocation of C6/C7 cervical vertebrae, subsequent encounter: Secondary | ICD-10-CM | POA: Diagnosis not present

## 2021-12-12 DIAGNOSIS — M4312 Spondylolisthesis, cervical region: Secondary | ICD-10-CM | POA: Diagnosis not present

## 2021-12-14 ENCOUNTER — Other Ambulatory Visit: Payer: Self-pay | Admitting: *Deleted

## 2021-12-14 ENCOUNTER — Other Ambulatory Visit: Payer: Self-pay

## 2021-12-14 DIAGNOSIS — M542 Cervicalgia: Secondary | ICD-10-CM | POA: Diagnosis not present
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
## 2021-12-17 ENCOUNTER — Other Ambulatory Visit: Payer: Self-pay

## 2021-12-17 DIAGNOSIS — M542 Cervicalgia: Secondary | ICD-10-CM | POA: Diagnosis not present

## 2021-12-17 NOTE — Patient Instructions (Signed)
Visit Information  [name]  - as a part of your Medicaid benefit, you are eligible for care management and care coordination services at no cost or copay. I was unable to reach you by phone today but would be happy to help you with your health related needs. Please feel free to call me @ [phone]  A member of the Managed Medicaid care management team will reach out to you again over the next 7 days.   Mickel Fuchs, BSW, [place] Managed Medicaid Team  [phone]

## 2021-12-17 NOTE — Patient Outreach (Signed)
Care Coordination  12/17/2021  [name] 08-03-1981 [id number]   Medicaid Managed Care   Unsuccessful Outreach Note  12/17/2021 Name: Jessica Peters MRN: [id number] DOB: [birth year]  Referred by: Valerie Roys, DO Reason for referral : High Risk Managed Medicaid (MM social Work unsuccessful telephone outreach)   An unsuccessful telephone outreach was attempted today. The patient was referred to the case management team for assistance with care management and care coordination.   Follow Up Plan: The care management team will reach out to the patient again over the next 7 days.   Mickel Fuchs, BSW, [place] Managed Medicaid Team  [phone]

## 2021-12-18 ENCOUNTER — Ambulatory Visit: Payer: Self-pay

## 2021-12-20 ENCOUNTER — Telehealth (INDEPENDENT_AMBULATORY_CARE_PROVIDER_SITE_OTHER): Payer: Medicaid Other | Admitting: Family Medicine

## 2021-12-20 ENCOUNTER — Encounter: Payer: Self-pay | Admitting: Family Medicine

## 2021-12-20 DIAGNOSIS — F339 Major depressive disorder, recurrent, unspecified: Secondary | ICD-10-CM

## 2021-12-20 DIAGNOSIS — F4310 Post-traumatic stress disorder, unspecified: Secondary | ICD-10-CM | POA: Diagnosis not present

## 2021-12-20 DIAGNOSIS — F419 Anxiety disorder, unspecified: Secondary | ICD-10-CM | POA: Diagnosis not present

## 2021-12-20 DIAGNOSIS — S14109A Unspecified injury at unspecified level of cervical spinal cord, initial encounter: Secondary | ICD-10-CM

## 2021-12-20 MED ORDER — DIAZEPAM 5 MG PO TABS: 5.0000 mg | ORAL_TABLET | Freq: Four times a day (QID) | ORAL | 0 refills | Status: DC

## 2021-12-20 MED ORDER — DULOXETINE HCL 40 MG PO CPEP: 40.0000 mg | ORAL_CAPSULE | Freq: Every day | ORAL | 2 refills | Status: DC

## 2021-12-20 MED ORDER — DIAZEPAM 5 MG PO TABS: 5.0000 mg | ORAL_TABLET | Freq: Four times a day (QID) | ORAL | 0 refills | Status: AC | PRN
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
## 2021-12-23 ENCOUNTER — Encounter: Payer: Self-pay | Admitting: Family Medicine

## 2021-12-23 NOTE — Assessment & Plan Note (Signed)
Neurosurgery will not give her more than 10 days on her controlled substances due to PA. Has weaned off pain medicine, but continues on valium for muscle spasms. Doing PT. Will take over her valium for now on neurosurgery recommendations. Continue to monitor closely. Call with any concerns.

## 2021-12-23 NOTE — Assessment & Plan Note (Signed)
Not doing well. Seeing psychiatry 2/14. Will increase her cymbalta and await input from psychiatry. Continue to monitor closely.

## 2021-12-25 ENCOUNTER — Ambulatory Visit: Payer: Self-pay | Admitting: Psychiatry

## 2021-12-26 ENCOUNTER — Other Ambulatory Visit: Payer: Self-pay

## 2021-12-26 NOTE — Patient Outreach (Signed)
Care Coordination  12/26/2021  [name] 07/10/1981 [id number]   Medicaid Managed Care   Unsuccessful Outreach Note  12/26/2021 Name: Jessica Peters MRN: [id number] DOB: Dec 31, 1980  Referred by: Valerie Roys, DO Reason for referral : High Risk Managed Medicaid (MM Social Work Unsuccessful [employer])   A second unsuccessful telephone outreach was attempted today. The patient was referred to the case management team for assistance with care management and care coordination.   Follow Up Plan: The care management team will reach out to the patient again over the next 7 days.   Marland Kitchenajs

## 2021-12-26 NOTE — Patient Instructions (Signed)
Visit Information  Ms. [name]  - as a part of your Medicaid benefit, you are eligible for care management and care coordination services at no cost or copay. I was unable to reach you by phone today but would be happy to help you with your health related needs. Please feel free to call me [phone]   A member of the Managed Medicaid care management team will reach out to you again over the next 7 days.   Mickel Fuchs, BSW, [place] Managed Medicaid Team  [phone]

## 2021-12-30 ENCOUNTER — Other Ambulatory Visit: Payer: Self-pay | Admitting: Family Medicine

## 2021-12-31 NOTE — Telephone Encounter (Signed)
Requested medication (s) are due for refill today: yes  Requested medication (s) are on the active medication list: yes  Last refill:  11/29/21  Future visit scheduled: 01/17/22  Notes to clinic:  This med does not have a protocol to follow to evaluate, please assess.  Requested Prescriptions  Pending Prescriptions Disp Refills   ciclopirox (PENLAC) 8 % solution [Pharmacy Med Name: CICLOPIROX 8% NAIL LACQUER TOP SOLN] 6.6 mL 0    Sig: APPLY TOPICALLY AT BEDTIME OVER NAIL AND SURROUNDING SKIN. APPLY DAILY OVER PREVIOUS COAT AND AFTER 7 DAYS REMOVE W/ ALCOHOL AND CONTINUE CYCLE     Off-Protocol Failed - 12/30/2021 11:00 AM      Failed - Medication not assigned to a protocol, review manually.      Passed - Valid encounter within last 12 months    Recent Outpatient Visits           1 week ago [place], Megan P, DO   1 month ago PTSD (post-traumatic stress disorder)   [place], Megan P, DO   1 month ago PTSD (post-traumatic stress disorder)   [place], Megan P, DO   1 month ago Upper respiratory tract infection, unspecified type   [facility], Megan P, DO   2 months ago PTSD (post-traumatic stress disorder)   [facility] Valerie Roys, DO       Future Appointments             In 2 weeks Wynetta Emery, Barb Merino, DO [employer], [facility]

## 2022-01-08 ENCOUNTER — Ambulatory Visit: Payer: Medicaid Other | Admitting: Licensed Clinical Social Worker

## 2022-01-08 ENCOUNTER — Telehealth: Payer: Self-pay | Admitting: Licensed Clinical Social Worker

## 2022-01-08 ENCOUNTER — Other Ambulatory Visit: Payer: Self-pay

## 2022-01-08 NOTE — Progress Notes (Unsigned)
Pt was scheduled for OPT/initial visit today.  As of 11:00 (appointment time), pt had not completed and submitted new pt paperwork to [employer].  Paperwork including history and signed consents are necessary prior to provider service.   Pt appointment cancelled--pt reports that she wishes to not reschedule appointment.

## 2022-01-08 NOTE — Telephone Encounter (Signed)
Not sure why I am sent this message too.Jessica Peters

## 2022-01-08 NOTE — Telephone Encounter (Signed)
Spoke with patient. Did not receive new  patient paperwork in an ample amount of time. Did receive new patient  paperwork at 11:08, not enough  time for therapist to review for appt. Spoke with patient and she stated that she no longer wanted to do the therapy due to there being such a long wait, rols her she could be wait listed but she did not want to  do therapy any longer. Stated she can make herself positive

## 2022-01-08 NOTE — Telephone Encounter (Signed)
Not sure why this was forwarded to me.

## 2022-01-14 ENCOUNTER — Other Ambulatory Visit: Payer: Self-pay | Admitting: *Deleted

## 2022-01-14 NOTE — Patient Instructions (Signed)
Visit Information  [name]  - as a part of your Medicaid benefit, you are eligible for care management and care coordination services at no cost or copay. I was unable to reach you by phone today but would be happy to help you with your health related needs. Please feel free to call me @ [phone].   A member of the Managed Medicaid care management team will reach out to you again over the next 14 days.   Hazyl Marseille RN, BSN [place]  [facility] RN Care Coordinator   

## 2022-01-14 NOTE — Patient Outreach (Signed)
Care Coordination  01/14/2022  Jessica Peters 04/22/1981 [id number]   Medicaid Managed Care   Unsuccessful Outreach Note  01/14/2022 Name: Jessica Peters MRN: [id number] DOB: 13-Mar-1981  Referred by: Valerie Roys, DO Reason for referral : High Risk Managed Medicaid (Unsuccessful RNCM follow up telephone outreach)   An unsuccessful telephone outreach was attempted today. The patient was referred to the case management team for assistance with care management and care coordination.   Follow Up Plan: A HIPAA compliant phone message was left for the patient providing contact information and requesting a return call.   [name] RN, BSN [place] RN Care Coordinator

## 2022-01-17 ENCOUNTER — Telehealth (INDEPENDENT_AMBULATORY_CARE_PROVIDER_SITE_OTHER): Payer: Medicaid Other | Admitting: Family Medicine

## 2022-01-17 ENCOUNTER — Encounter: Payer: Self-pay | Admitting: Family Medicine

## 2022-01-17 VITALS — Wt 137.0 lb

## 2022-01-17 DIAGNOSIS — F419 Anxiety disorder, unspecified: Secondary | ICD-10-CM | POA: Diagnosis not present

## 2022-01-17 DIAGNOSIS — S14109A Unspecified injury at unspecified level of cervical spinal cord, initial encounter: Secondary | ICD-10-CM

## 2022-01-17 MED ORDER — DIAZEPAM 5 MG PO TABS: 5.0000 mg | ORAL_TABLET | Freq: Four times a day (QID) | ORAL | 0 refills | Status: DC

## 2022-01-17 MED ORDER — GABAPENTIN 400 MG PO CAPS: 800.0000 mg | ORAL_CAPSULE | Freq: Three times a day (TID) | ORAL | 1 refills | Status: DC
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
## 2022-01-18 NOTE — Assessment & Plan Note (Signed)
Very groggy on her gabapentin. Will decrease to 800mg  daily and see if that controls her pain and makes her mood better. Continue cymbalta. Recheck about 1 month. Refill on her valium given. Continue to monitor.

## 2022-01-18 NOTE — Assessment & Plan Note (Signed)
Slightly improved. Will continue current regimen with decreased gabapentin and see how she does. Recheck about 1 month. Call with any concerns.

## 2022-01-18 NOTE — Progress Notes (Signed)
Left message asking pt to call back to schedule fu appt.

## 2022-01-21 NOTE — Progress Notes (Signed)
2nd attempt- Left message to schedule fu appt.

## 2022-01-22 ENCOUNTER — Other Ambulatory Visit: Payer: Self-pay

## 2022-01-22 ENCOUNTER — Other Ambulatory Visit: Payer: Self-pay | Admitting: *Deleted
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
## 2022-01-23 ENCOUNTER — Telehealth: Payer: Self-pay | Admitting: *Deleted

## 2022-01-23 NOTE — Chronic Care Management (AMB) (Signed)
?  Care Management  ?01/23/2022 ?Name: Jessica Peters MRN: [id number] DOB: 1981-05-07 ? ?Referred by: Valerie Roys, DO ?Reason for referral : High Risk Managed Medicaid (Outreach to schedule initial call with SW ) ? ? ?An unsuccessful telephone outreach was attempted today. The patient was referred to the case management team for assistance with care management and care coordination.   ? ?Follow Up Plan: A HIPAA compliant phone message was left for the patient providing contact information and requesting a return call. and The Managed Medicaid care management team will reach out to the patient again over the next 7 days.  ? ? ?Laverda Sorenson  ?Care Guide, Embedded Care Coordination  High Risk Medicaid Managed Care  ?[place]  ?Direct Dial: [phone]  ? ?

## 2022-01-30 ENCOUNTER — Telehealth: Payer: Self-pay | Admitting: *Deleted

## 2022-01-30 NOTE — Chronic Care Management (AMB) (Signed)
?  Care Management  ? ?Note ?01/30/2022 ?Name: Jessica Peters MRN: [id number] DOB: [birth year] ? ?Referred by: Valerie Roys, DO ?Reason for referral : High Risk Managed Medicaid (Second outreach to schedule initial call with Licensed Holiday representative ) ? ? ?A second unsuccessful telephone outreach was attempted today. The patient was referred to the case management team for assistance with care management and care coordination.   ? ?Follow Up Plan: A HIPAA compliant phone message was left for the patient providing contact information and requesting a return call. and The Managed Medicaid care management team will reach out to the patient again over the next 7 days.  ? ? ?Laverda Sorenson  ?Care Guide, Embedded Care Coordination  High Risk Medicaid Managed Care  ?[place]  ?Direct Dial: [phone]  ?

## 2022-02-01 ENCOUNTER — Encounter: Payer: Self-pay | Admitting: Family Medicine

## 2022-02-01 ENCOUNTER — Telehealth (INDEPENDENT_AMBULATORY_CARE_PROVIDER_SITE_OTHER): Payer: Medicaid Other | Admitting: Family Medicine

## 2022-02-01 DIAGNOSIS — S14109A Unspecified injury at unspecified level of cervical spinal cord, initial encounter: Secondary | ICD-10-CM

## 2022-02-01 DIAGNOSIS — F339 Major depressive disorder, recurrent, unspecified: Secondary | ICD-10-CM | POA: Diagnosis not present

## 2022-02-01 DIAGNOSIS — F4310 Post-traumatic stress disorder, unspecified: Secondary | ICD-10-CM

## 2022-02-01 DIAGNOSIS — F419 Anxiety disorder, unspecified: Secondary | ICD-10-CM

## 2022-02-01 MED ORDER — DULOXETINE HCL 60 MG PO CPEP: 60.0000 mg | ORAL_CAPSULE | Freq: Every day | ORAL | 3 refills | Status: DC

## 2022-02-01 MED ORDER — PREGABALIN 50 MG PO CAPS: ORAL_CAPSULE | ORAL | 1 refills | Status: DC

## 2022-02-01 NOTE — Patient Instructions (Addendum)
Take 1 lyrica in the morning for 1 week with '800mg'$  gabapentin 3x a day ?Take 1 lyrica 2x a day for 1 week with '700mg'$  (1 400 and 1 300) gabapentin in the AM and '800mg'$  in the PM and bed time ?I'll see you in 2 weeks to talk going up on the lyrica and down on the gabapentin ?

## 2022-02-01 NOTE — Assessment & Plan Note (Signed)
Not doing well. Will increase her cymbalta to '60mg'$  for both neuropathy and mood and see about trying to get her into see psychiatry. Referral generated today.  ?
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
## 2022-02-01 NOTE — Assessment & Plan Note (Signed)
Unable to tolerate dropping from '900mg'$  TID of gabapentin to '800mg'$  TID. Not feeling well on the gabapentin. Will remain at '800mg'$  TID gabapentin and start '50mg'$  daily of lyrica- goal of titrating up on gabapentin and titrating down on lyrica over the next few weeks. We will not be keeping her on both. Referral to pain management also placed today. Call with any concerns.  ?

## 2022-02-04 NOTE — Progress Notes (Signed)
PT scheduled

## 2022-02-08 ENCOUNTER — Telehealth: Payer: Self-pay | Admitting: *Deleted

## 2022-02-08 NOTE — Chronic Care Management (AMB) (Signed)
?  Care Management  ? ?02/08/2022 ?Name: Jessica Peters MRN: [id number] DOB: Aug 19, 1981 ? ? ?Jessica Peters is a 41 y.o. year old female who is a primary care patient of Valerie Roys, DO. [name] is enrolled in a Managed Medicaid plan: Yes. I reached out to Patient                                              by phone today in response to a referral sent by Ms. [name]  [name] RNCM . Outreach attempt today was successful.  ? ? ?[name] was given information about care management services as a benefit of their Medicaid health plan.  ?Patient                                             did not agree to speak with Licensed Clinical Social Worker with the care management services and does not wish to consider at this time. Patient is seeing [name], MSN-PMHNP-BC with [facility] on 02/12/22 per patient.  ? ?Follow up plan: ?The patient has been provided with contact information for the care management team and has been advised to call with any health related questions or concerns.  ? ?Laverda Sorenson  ?Care Guide, Embedded Care Coordination  High Risk Medicaid Managed Care  ?[place]  ?Direct Dial: [phone]  ? ?

## 2022-02-12 DIAGNOSIS — F4310 Post-traumatic stress disorder, unspecified: Secondary | ICD-10-CM | POA: Diagnosis not present

## 2022-02-12 DIAGNOSIS — F411 Generalized anxiety disorder: Secondary | ICD-10-CM | POA: Diagnosis not present

## 2022-02-12 DIAGNOSIS — F332 Major depressive disorder, recurrent severe without psychotic features: Secondary | ICD-10-CM | POA: Diagnosis not present

## 2022-02-19 ENCOUNTER — Telehealth (INDEPENDENT_AMBULATORY_CARE_PROVIDER_SITE_OTHER): Payer: Medicaid Other | Admitting: Family Medicine

## 2022-02-19 DIAGNOSIS — Z91199 Patient's noncompliance with other medical treatment and regimen due to unspecified reason: Secondary | ICD-10-CM

## 2022-02-19 NOTE — Progress Notes (Signed)
Patient did not answer the phone for rooming at scheduled time ?

## 2022-02-20 ENCOUNTER — Ambulatory Visit: Payer: Self-pay | Admitting: *Deleted

## 2022-02-20 ENCOUNTER — Other Ambulatory Visit: Payer: Self-pay | Admitting: Family Medicine

## 2022-02-20 NOTE — Telephone Encounter (Signed)
Requested medication (s) are due for refill today: yes ? ?Requested medication (s) are on the active medication list: yes ? ?Last refill:  01/22/22 #90/0 ? ?Future visit scheduled: yes ? ?Notes to clinic:  Unable to refill per protocol, cannot delegate. ? ? ? ?  ?Requested Prescriptions  ?Pending Prescriptions Disp Refills  ? diazepam (VALIUM) 5 MG tablet 90 tablet 0  ?  Sig: Take 1 tablet (5 mg total) by mouth every 6 (six) hours.  ?  ? Not Delegated - Psychiatry: Anxiolytics/Hypnotics 2 Failed - 02/20/2022  5:02 PM  ?  ?  Failed - This refill cannot be delegated  ?  ?  Failed - Urine Drug Screen completed in last 360 days  ?  ?  Passed - Patient is not pregnant  ?  ?  Passed - Valid encounter within last 6 months  ?  Recent Outpatient Visits   ? ?      ? Yesterday No-show for appointment  ? [place], DO  ? 2 weeks ago Anxiety  ? [place], DO  ? 1 month ago Anxiety  ? [place], DO  ? 2 months ago Anxiety  ? [place], Megan P, DO  ? 2 months ago PTSD (post-traumatic stress disorder)  ? [place], [place] P, DO  ? ?  ?  ?Future Appointments   ? ?        ? In 6 days Valerie Roys, DO [facility]  ? ?  ? ?  ?  ?  ? ?
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
## 2022-02-20 NOTE — Telephone Encounter (Signed)
Pt called in stating that she is out of Valium and needing refill. She need the psychiatrist and they prescribed but pharmacy denied because Dr. Wynetta Emery has been filling it per pt. She rescheduled appt she missed yesterday and is asking if this can be refilled until her appt in worst case situation. She took last dose this morning at 0600.  ? ?Reason for Disposition ? Caller requesting a CONTROLLED substance prescription refill (e.g., narcotics, ADHD medicines) ? ?Answer Assessment - Initial Assessment Questions ?1. DRUG NAME: "What medicine do you need to have refilled?" ?    Valium ?2. REFILLS REMAINING: "How many refills are remaining?" (Note: The label on the medicine or pill bottle will show how many refills are remaining. If there are no refills remaining, then a renewal may be needed.) ?    0 ?3. EXPIRATION DATE: "What is the expiration date?" (Note: The label states when the prescription will expire, and thus can no longer be refilled.) ?    0 ?4. PRESCRIBING HCP: "Who prescribed it?" Reason: If prescribed by specialist, call should be referred to that group. ?    Dr. Wynetta Emery ? ?Protocols used: Medication Refill and Renewal Call-A-AH ? ?

## 2022-02-20 NOTE — Telephone Encounter (Signed)
Copied from [place] [phone]. Topic: Quick Communication - Rx Refill/Question ?>> Feb 20, 2022  3:23 PM Tessa Lerner A wrote: ?Medication: diazepam (VALIUM) 5 MG tablet [[id number]]  ? ?Has the patient contacted their pharmacy? No. ?(Agent: If no, request that the patient contact the pharmacy for the refill. If patient does not wish to contact the pharmacy document the reason why and proceed with request.) ?(Agent: If yes, when and what did the pharmacy advise?) ? ?Preferred Pharmacy (with phone number or street name): [facility] [place], [address] ?[place] [address] ?Phone: [phone] Fax: [phone] ?Hours: Not open 24 hours ? ?Has the patient been seen for an appointment in the last year OR does the patient have an upcoming appointment? Yes.   ? ?Agent: Please be advised that RX refills may take up to 3 business days. We ask that you follow-up with your pharmacy. ?

## 2022-02-21 ENCOUNTER — Other Ambulatory Visit: Payer: Self-pay | Admitting: *Deleted

## 2022-02-21 NOTE — Telephone Encounter (Signed)
Pt rescheduled for 4/4 ?
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
## 2022-02-21 NOTE — Telephone Encounter (Signed)
Missed appt. Needs appt ?
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
## 2022-02-22 ENCOUNTER — Telehealth: Payer: Self-pay | Admitting: Family Medicine

## 2022-02-22 MED ORDER — DIAZEPAM 5 MG PO TABS: 5.0000 mg | ORAL_TABLET | Freq: Four times a day (QID) | ORAL | 0 refills | Status: AC

## 2022-02-22 NOTE — Telephone Encounter (Signed)
Pharmacy called and stated that Dr. Madelaine Bhat In [facility] also sent in Rx for diazepam (VALIUM) 5 MG tablet quantity of 90 / Medicaid rejected it but pharmacy wanted to check with Dr. Wynetta Emery to see who is following or treating this pt / please advise  ?

## 2022-02-22 NOTE — Telephone Encounter (Signed)
I had referred her to psychiatry- which looks like who sent in the other Rx but I was not aware that she had seen them yet. I am due to see her on Tuesday and will confirm what's going on. Does she have enough medicine to make it until Tuesday? ?

## 2022-02-22 NOTE — Telephone Encounter (Signed)
Called and clarified medication with the pharmacy. They are getting the prescription ready written by Dr. Wynetta Emery today.  ? ?Called and notified patient of this.  ?

## 2022-02-22 NOTE — Telephone Encounter (Signed)
Routing to provider to advise.  

## 2022-02-26 ENCOUNTER — Telehealth (INDEPENDENT_AMBULATORY_CARE_PROVIDER_SITE_OTHER): Payer: Medicaid Other | Admitting: Family Medicine

## 2022-02-26 ENCOUNTER — Encounter: Payer: Self-pay | Admitting: Family Medicine

## 2022-02-26 DIAGNOSIS — F419 Anxiety disorder, unspecified: Secondary | ICD-10-CM

## 2022-02-26 DIAGNOSIS — S14109A Unspecified injury at unspecified level of cervical spinal cord, initial encounter: Secondary | ICD-10-CM

## 2022-02-26 DIAGNOSIS — F4310 Post-traumatic stress disorder, unspecified: Secondary | ICD-10-CM | POA: Diagnosis not present

## 2022-02-26 DIAGNOSIS — F339 Major depressive disorder, recurrent, unspecified: Secondary | ICD-10-CM

## 2022-02-26 MED ORDER — PREGABALIN 50 MG PO CAPS: ORAL_CAPSULE | ORAL | 0 refills | Status: DC

## 2022-02-26 NOTE — Progress Notes (Signed)
? ?Wt 142 lb (64.4 kg)   LMP 11/29/2015 (Exact Date)   BMI 20.37 kg/m?   ? ?Subjective:  ? ? Patient ID: Jessica Peters, female    DOB: [birth year], 41 y.o.   MRN: [id number] ? ?HPI: ?Jessica Peters is a 41 y.o. female ? ?Chief Complaint  ?Patient presents with  ? Anxiety  ? Depression  ? ?ANXIETY/STRESS- got hooked up with psychiatry. Moving in with her Dad. Still taking the 800 on the gabapentin, taking the lyrica BID, unsure how they psychiatrist is going to mess with her medicine. She liked her. Also seeing a Social worker. ?Duration: months ?Status:stable ?Anxious mood: yes  ?Excessive worrying: yes ?Irritability: yes  ?Sweating: no ?Nausea: no ?Palpitations:no ?Hyperventilation: yes ?Panic attacks: yes ?Agoraphobia: yes  ?Obscessions/compulsions: yes ?Depressed mood: yes ? ?  02/26/2022  ?  4:23 PM 02/01/2022  ? 10:58 AM 01/17/2022  ?  4:19 PM 12/20/2021  ?  4:29 PM 11/14/2021  ? 10:44 AM  ?Depression screen PHQ 2/9  ?Decreased Interest '2 3 2 3 3  '$ ?Down, Depressed, Hopeless '2 3 2 3 3  '$ ?PHQ - 2 Score '4 6 4 6 6  '$ ?Altered sleeping '3 3 3 3 3  '$ ?Tired, decreased energy '3 3 3 3 3  '$ ?Change in appetite '2 3 3 3 3  '$ ?Feeling bad or failure about yourself  '2 3 1 3 3  '$ ?Trouble concentrating '3 3 3 3 3  '$ ?Moving slowly or fidgety/restless '2 3 3 3 '$ 0  ?Suicidal thoughts 0 0 0 3   ?PHQ-9 Score '19 24 20 27 21  '$ ?Difficult doing work/chores    Very difficult Very difficult  ? ?Anhedonia: no ?Weight changes: no ?Insomnia: yes hard to fall asleep  ?Hypersomnia: yes ?Fatigue/loss of energy: yes ?Feelings of worthlessness: yes ?Feelings of guilt: yes ?Impaired concentration/indecisiveness: yes ?Suicidal ideations: no  ?Crying spells: no ?Recent Stressors/Life Changes: yes ?  Relationship problems: yes ?  Family stress: yes   ?  Financial stress: yes  ?  Job stress: yes  ?  Recent death/loss: no ? ?Neuropathy is stable. Feeling like she can do more. Tolerating the lyrica well. No other concerns.  ? ?Relevant past medical, surgical, family and  social history reviewed and updated as indicated. Interim medical history since our last visit reviewed. ?Allergies and medications reviewed and updated. ? ?Review of Systems  ?Constitutional: Negative.   ?Respiratory: Negative.    ?Cardiovascular: Negative.   ?Gastrointestinal: Negative.   ?Musculoskeletal: Negative.   ?Neurological:  Positive for numbness. Negative for dizziness, tremors, seizures, syncope, facial asymmetry, speech difficulty, weakness, light-headedness and headaches.  ?Psychiatric/Behavioral: Negative.    ? ?Per HPI unless specifically indicated above ? ?   ?Objective:  ?  ?Wt 142 lb (64.4 kg)   LMP 11/29/2015 (Exact Date)   BMI 20.37 kg/m?   ?Wt Readings from Last 3 Encounters:  ?02/26/22 142 lb (64.4 kg)  ?01/17/22 137 lb (62.1 kg)  ?12/20/21 133 lb (60.3 kg)  ?  ?Physical Exam ?Vitals and nursing note reviewed.  ?Constitutional:   ?   General: She is not in acute distress. ?   Appearance: Normal appearance. She is not ill-appearing, toxic-appearing or diaphoretic.  ?HENT:  ?   Head: Normocephalic and atraumatic.  ?   Right Ear: External ear normal.  ?   Left Ear: External ear normal.  ?   Nose: Nose normal.  ?   Mouth/Throat:  ?   Mouth: Mucous membranes are moist.  ?  Pharynx: Oropharynx is clear.  ?Eyes:  ?   General: No scleral icterus.    ?   Right eye: No discharge.     ?   Left eye: No discharge.  ?   Conjunctiva/sclera: Conjunctivae normal.  ?   Pupils: Pupils are equal, round, and reactive to light.  ?Pulmonary:  ?   Effort: Pulmonary effort is normal. No respiratory distress.  ?   Comments: Speaking in full sentences ?Musculoskeletal:     ?   General: Normal range of motion.  ?   Cervical back: Normal range of motion.  ?Skin: ?   Coloration: Skin is not jaundiced or pale.  ?   Findings: No bruising, erythema, lesion or rash.  ?Neurological:  ?   Mental Status: She is alert and oriented to person, place, and time. Mental status is at baseline.  ?Psychiatric:     ?   Mood and Affect:  Mood normal.     ?   Behavior: Behavior normal.     ?   Thought Content: Thought content normal.     ?   Judgment: Judgment normal.  ? ? ?Results for orders placed or performed during the hospital encounter of 09/01/21  ?Resp Panel by RT-PCR (Flu A&B, Covid) Nasopharyngeal Swab  ? Specimen: Nasopharyngeal Swab; Nasopharyngeal(NP) swabs in vial transport medium  ?Result Value Ref Range  ? SARS Coronavirus 2 by RT PCR NEGATIVE NEGATIVE  ? Influenza A by PCR NEGATIVE NEGATIVE  ? Influenza B by PCR NEGATIVE NEGATIVE  ?Surgical pcr screen  ? Specimen: Nasal Mucosa; Nasal Swab  ?Result Value Ref Range  ? MRSA, PCR NEGATIVE NEGATIVE  ? Staphylococcus aureus NEGATIVE NEGATIVE  ?Comprehensive metabolic panel  ?Result Value Ref Range  ? Sodium 139 135 - 145 mmol/L  ? Potassium 3.4 (L) 3.5 - 5.1 mmol/L  ? Chloride 106 98 - 111 mmol/L  ? CO2 22 22 - 32 mmol/L  ? Glucose, Bld 91 70 - 99 mg/dL  ? BUN 6 6 - 20 mg/dL  ? Creatinine, Ser 0.66 0.44 - 1.00 mg/dL  ? Calcium 8.8 (L) 8.9 - 10.3 mg/dL  ? Total Protein 6.9 6.5 - 8.1 g/dL  ? Albumin 4.3 3.5 - 5.0 g/dL  ? AST 32 15 - 41 U/L  ? ALT 19 0 - 44 U/L  ? Alkaline Phosphatase 19 (L) 38 - 126 U/L  ? Total Bilirubin 0.8 0.3 - 1.2 mg/dL  ? GFR, Estimated >60 >60 mL/min  ? Anion gap 11 5 - 15  ?CBC  ?Result Value Ref Range  ? WBC 11.8 (H) 4.0 - 10.5 K/uL  ? RBC 4.64 3.87 - 5.11 MIL/uL  ? Hemoglobin 14.5 12.0 - 15.0 g/dL  ? HCT 44.1 36.0 - 46.0 %  ? MCV 95.0 80.0 - 100.0 fL  ? MCH 31.3 26.0 - 34.0 pg  ? MCHC 32.9 30.0 - 36.0 g/dL  ? RDW 11.9 11.5 - 15.5 %  ? Platelets 132 (L) 150 - 400 K/uL  ? nRBC 0.0 0.0 - 0.2 %  ?Ethanol  ?Result Value Ref Range  ? Alcohol, Ethyl (B) 256 (H) <10 mg/dL  ?Urinalysis, Routine w reflex microscopic  ?Result Value Ref Range  ? Color, Urine COLORLESS (A) YELLOW  ? APPearance CLEAR CLEAR  ? Specific Gravity, Urine 1.003 (L) 1.005 - 1.030  ? pH 6.0 5.0 - 8.0  ? Glucose, UA NEGATIVE NEGATIVE mg/dL  ? Hgb urine dipstick MODERATE (A) NEGATIVE  ? Bilirubin Urine  NEGATIVE NEGATIVE  ?  Ketones, ur NEGATIVE NEGATIVE mg/dL  ? Protein, ur NEGATIVE NEGATIVE mg/dL  ? Nitrite NEGATIVE NEGATIVE  ? Leukocytes,Ua NEGATIVE NEGATIVE  ? RBC / HPF 0-5 0 - 5 RBC/hpf  ? WBC, UA 0-5 0 - 5 WBC/hpf  ? Bacteria, UA NONE SEEN NONE SEEN  ? Squamous Epithelial / LPF 0-5 0 - 5  ? Mucus PRESENT   ?Lactic acid, plasma  ?Result Value Ref Range  ? Lactic Acid, Venous 2.1 (HH) 0.5 - 1.9 mmol/L  ?Protime-INR  ?Result Value Ref Range  ? Prothrombin Time 13.7 11.4 - 15.2 seconds  ? INR 1.1 0.8 - 1.2  ?HIV Antibody (routine testing w rflx)  ?Result Value Ref Range  ? HIV Screen 4th Generation wRfx Non Reactive Non Reactive  ?CBC  ?Result Value Ref Range  ? WBC 13.8 (H) 4.0 - 10.5 K/uL  ? RBC 3.87 3.87 - 5.11 MIL/uL  ? Hemoglobin 11.9 (L) 12.0 - 15.0 g/dL  ? HCT 36.0 36.0 - 46.0 %  ? MCV 93.0 80.0 - 100.0 fL  ? MCH 30.7 26.0 - 34.0 pg  ? MCHC 33.1 30.0 - 36.0 g/dL  ? RDW 12.1 11.5 - 15.5 %  ? Platelets 126 (L) 150 - 400 K/uL  ? nRBC 0.0 0.0 - 0.2 %  ?Basic Metabolic Panel  ?Result Value Ref Range  ? Sodium 133 (L) 135 - 145 mmol/L  ? Potassium 3.8 3.5 - 5.1 mmol/L  ? Chloride 102 98 - 111 mmol/L  ? CO2 24 22 - 32 mmol/L  ? Glucose, Bld 107 (H) 70 - 99 mg/dL  ? BUN 8 6 - 20 mg/dL  ? Creatinine, Ser 0.68 0.44 - 1.00 mg/dL  ? Calcium 7.9 (L) 8.9 - 10.3 mg/dL  ? GFR, Estimated >60 >60 mL/min  ? Anion gap 7 5 - 15  ?I-Stat Chem 8, ED  ?Result Value Ref Range  ? Sodium 143 135 - 145 mmol/L  ? Potassium 3.4 (L) 3.5 - 5.1 mmol/L  ? Chloride 104 98 - 111 mmol/L  ? BUN 7 6 - 20 mg/dL  ? Creatinine, Ser 1.00 0.44 - 1.00 mg/dL  ? Glucose, Bld 87 70 - 99 mg/dL  ? Calcium, Ion 1.09 (L) 1.15 - 1.40 mmol/L  ? TCO2 25 22 - 32 mmol/L  ? Hemoglobin 15.3 (H) 12.0 - 15.0 g/dL  ? HCT 45.0 36.0 - 46.0 %  ?I-Stat beta hCG blood, ED  ?Result Value Ref Range  ? I-stat hCG, quantitative <5.0 <5 mIU/mL  ? Comment 3          ?Sample to Blood Bank  ?Result Value Ref Range  ? Blood Bank Specimen SAMPLE AVAILABLE FOR TESTING   ? Sample  Expiration    ?  09/02/2021,2359 ?Performed at [facility], [place] [address] [place], [place] [address] ?  ? ?   ?Assessment & Plan:  ? ?Problem List Items Addressed This Visit   ? ?  ? Musculoskeletal

## 2022-02-26 NOTE — Patient Instructions (Addendum)
WEEK 1: Take 1 lyrica '50mg'$  2x a day for 1 week with '700mg'$  gabapentin (1 400 and 1 300) ?WEEK 2: Take 2 lyrica '50mg'$  in the AM and 1 in the PM for 1 week with '600mg'$  gabapentin in the AM and '700mg'$  in the PM ?WEEK 3: Take 2 lyrica '50mg'$  BID for 1 week with '600mg'$  gabapentin BID  ?WEEK 4: Take 3 lyrica '50mg'$  in the AM and 2 in the PM for 1 week with '400mg'$  gabapentin in the AM and '600mg'$  in the PM ?WEEK 5: Take 3 lyrica '50mg'$  BID for 1 week with '400mg'$  gabapentin BID ?You'll see me right after this ?

## 2022-02-27 NOTE — Progress Notes (Signed)
LVM asking pt to schedule fu appt ?

## 2022-02-28 ENCOUNTER — Encounter: Payer: Self-pay | Admitting: Family Medicine

## 2022-02-28 NOTE — Assessment & Plan Note (Signed)
Has gotten in with psychiatry. They will be managing her medicine now. Continue to follow with them. Call with any concerns.  ?

## 2022-02-28 NOTE — Assessment & Plan Note (Signed)
Weaning down on the gabapentin as we're going up on the lyrica. Continue to titrate as discussed and in AVS.  ?

## 2022-02-28 NOTE — Progress Notes (Signed)
PT scheduled

## 2022-03-06 DIAGNOSIS — G959 Disease of spinal cord, unspecified: Secondary | ICD-10-CM | POA: Diagnosis not present

## 2022-03-06 DIAGNOSIS — S13171D Dislocation of C6/C7 cervical vertebrae, subsequent encounter: Secondary | ICD-10-CM | POA: Diagnosis not present

## 2022-03-22 ENCOUNTER — Other Ambulatory Visit: Payer: Self-pay

## 2022-03-22 ENCOUNTER — Emergency Department
Admission: EM | Admit: 2022-03-22 | Discharge: 2022-03-22 | Disposition: A | Discharge status: Home / Self Care | Payer: Medicaid Other | Attending: Emergency Medicine | Admitting: Emergency Medicine

## 2022-03-22 ENCOUNTER — Emergency Department: Payer: Medicaid Other

## 2022-03-22 DIAGNOSIS — R0789 Other chest pain: Secondary | ICD-10-CM | POA: Diagnosis not present

## 2022-03-22 DIAGNOSIS — G44319 Acute post-traumatic headache, not intractable: Secondary | ICD-10-CM | POA: Diagnosis not present

## 2022-03-22 DIAGNOSIS — Y92410 Unspecified street and highway as the place of occurrence of the external cause: Secondary | ICD-10-CM | POA: Diagnosis not present

## 2022-03-22 DIAGNOSIS — R519 Headache, unspecified: Secondary | ICD-10-CM | POA: Insufficient documentation

## 2022-03-22 DIAGNOSIS — M546 Pain in thoracic spine: Secondary | ICD-10-CM | POA: Diagnosis not present

## 2022-03-22 DIAGNOSIS — M542 Cervicalgia: Secondary | ICD-10-CM | POA: Insufficient documentation

## 2022-03-22 DIAGNOSIS — S299XXA Unspecified injury of thorax, initial encounter: Secondary | ICD-10-CM | POA: Diagnosis not present

## 2022-03-22 DIAGNOSIS — S3991XA Unspecified injury of abdomen, initial encounter: Secondary | ICD-10-CM | POA: Diagnosis not present

## 2022-03-22 DIAGNOSIS — Z90710 Acquired absence of both cervix and uterus: Secondary | ICD-10-CM | POA: Diagnosis not present

## 2022-03-22 DIAGNOSIS — J9811 Atelectasis: Secondary | ICD-10-CM | POA: Diagnosis not present

## 2022-03-22 LAB — CBC WITH DIFFERENTIAL/PLATELET
Abs Immature Granulocytes: 0.03 10*3/uL (ref 0.00–0.07)
Basophils Absolute: 0 10*3/uL (ref 0.0–0.1)
Basophils Relative: 0 %
Eosinophils Absolute: 0.1 10*3/uL (ref 0.0–0.5)
Eosinophils Relative: 1 %
HCT: 40.6 % (ref 36.0–46.0)
Hemoglobin: 13 g/dL (ref 12.0–15.0)
Immature Granulocytes: 0 %
Lymphocytes Relative: 25 %
Lymphs Abs: 1.8 10*3/uL (ref 0.7–4.0)
MCH: 29.7 pg (ref 26.0–34.0)
MCHC: 32 g/dL (ref 30.0–36.0)
MCV: 92.9 fL (ref 80.0–100.0)
Monocytes Absolute: 0.4 10*3/uL (ref 0.1–1.0)
Monocytes Relative: 6 %
Neutro Abs: 4.7 10*3/uL (ref 1.7–7.7)
Neutrophils Relative %: 68 %
Platelets: 107 10*3/uL — ABNORMAL LOW (ref 150–400)
RBC: 4.37 MIL/uL (ref 3.87–5.11)
RDW: 13.2 % (ref 11.5–15.5)
WBC: 7 10*3/uL (ref 4.0–10.5)
nRBC: 0 % (ref 0.0–0.2)

## 2022-03-22 LAB — BASIC METABOLIC PANEL
Anion gap: 9 (ref 5–15)
BUN: 11 mg/dL (ref 6–20)
CO2: 23 mmol/L (ref 22–32)
Calcium: 9 mg/dL (ref 8.9–10.3)
Chloride: 105 mmol/L (ref 98–111)
Creatinine, Ser: 0.72 mg/dL (ref 0.44–1.00)
GFR, Estimated: >60 mL/min (ref >60)
Glucose, Bld: 92 mg/dL (ref 70–99)
Potassium: 3.5 mmol/L (ref 3.5–5.1)
Sodium: 137 mmol/L (ref 135–145)
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
## 2022-03-22 MED ORDER — IOHEXOL 300 MG/ML  SOLN
100.0000 mL | Freq: Once | INTRAMUSCULAR | Status: AC | PRN
  Administered 2022-03-22: 100 mL via INTRAVENOUS
  Filled 2022-03-22: qty 100

## 2022-03-22 MED ORDER — ACETAMINOPHEN 500 MG PO TABS
1000.0000 mg | ORAL_TABLET | Freq: Once | ORAL | Status: AC
Start: 2022-03-22 — End: 2022-03-22
  Administered 2022-03-22: 1000 mg via ORAL
  Filled 2022-03-22: qty 2

## 2022-03-22 MED ORDER — FENTANYL CITRATE PF 50 MCG/ML IJ SOSY
50.0000 ug | PREFILLED_SYRINGE | Freq: Once | INTRAMUSCULAR | Status: AC
  Administered 2022-03-22: 50 ug via INTRAVENOUS
  Filled 2022-03-22: qty 1

## 2022-03-22 MED ORDER — IBUPROFEN 600 MG PO TABS
600.0000 mg | ORAL_TABLET | Freq: Once | ORAL | Status: AC
Start: 2022-03-22 — End: 2022-03-22
  Administered 2022-03-22: 600 mg via ORAL
  Filled 2022-03-22: qty 1

## 2022-03-22 MED ORDER — ONDANSETRON HCL 4 MG/2ML IJ SOLN
4.0000 mg | Freq: Once | INTRAMUSCULAR | Status: AC
  Administered 2022-03-22: 4 mg via INTRAVENOUS
  Filled 2022-03-22: qty 2
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
## 2022-03-25 ENCOUNTER — Other Ambulatory Visit: Payer: Self-pay | Admitting: *Deleted

## 2022-03-25 ENCOUNTER — Encounter: Payer: Self-pay | Admitting: Family Medicine

## 2022-03-25 DIAGNOSIS — F411 Generalized anxiety disorder: Secondary | ICD-10-CM | POA: Diagnosis not present

## 2022-03-25 DIAGNOSIS — F4310 Post-traumatic stress disorder, unspecified: Secondary | ICD-10-CM | POA: Diagnosis not present

## 2022-03-25 DIAGNOSIS — F332 Major depressive disorder, recurrent severe without psychotic features: Secondary | ICD-10-CM | POA: Diagnosis not present

## 2022-03-25 NOTE — Patient Outreach (Signed)
Care Coordination ? ?03/25/2022 ? ?Jessica Peters ?[birth year] ?[id number] ? ?Successful outreach with Ms. Barge. Call was abruptly disconnected, RNCM attempted to reach patient again. Call went to voicemail. RNCM left a detailed HIPAA compliant message, including contact information and requesting a return call. RNCM will attempt to reach patient again over the next 7 days. ? ?[name] RN, BSN ?[place] ?RN Care Coordinator ? ?
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
## 2022-03-27 DIAGNOSIS — G959 Disease of spinal cord, unspecified: Secondary | ICD-10-CM | POA: Diagnosis not present

## 2022-03-27 DIAGNOSIS — M542 Cervicalgia: Secondary | ICD-10-CM | POA: Diagnosis not present

## 2022-03-27 DIAGNOSIS — M5412 Radiculopathy, cervical region: Secondary | ICD-10-CM | POA: Diagnosis not present

## 2022-03-27 DIAGNOSIS — S13171D Dislocation of C6/C7 cervical vertebrae, subsequent encounter: Secondary | ICD-10-CM | POA: Diagnosis not present

## 2022-03-28 ENCOUNTER — Other Ambulatory Visit: Payer: Self-pay | Admitting: Neurosurgery

## 2022-03-28 DIAGNOSIS — M542 Cervicalgia: Secondary | ICD-10-CM

## 2022-03-28 NOTE — Telephone Encounter (Signed)
Can we please call the pharmacy and see what's going on? I'm no longer writing her meds ?

## 2022-04-01 ENCOUNTER — Ambulatory Visit
Admission: RE | Admit: 2022-04-01 | Discharge: 2022-04-01 | Disposition: A | Discharge status: Home / Self Care | Payer: Medicaid Other | Source: Ambulatory Visit | Attending: Neurosurgery | Admitting: Neurosurgery

## 2022-04-01 DIAGNOSIS — R222 Localized swelling, mass and lump, trunk: Secondary | ICD-10-CM | POA: Diagnosis not present

## 2022-04-01 DIAGNOSIS — R221 Localized swelling, mass and lump, neck: Secondary | ICD-10-CM | POA: Diagnosis not present

## 2022-04-01 DIAGNOSIS — M47812 Spondylosis without myelopathy or radiculopathy, cervical region: Secondary | ICD-10-CM | POA: Diagnosis not present

## 2022-04-01 DIAGNOSIS — Z9889 Other specified postprocedural states: Secondary | ICD-10-CM | POA: Diagnosis not present

## 2022-04-01 DIAGNOSIS — M542 Cervicalgia: Secondary | ICD-10-CM | POA: Insufficient documentation
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
## 2022-04-02 ENCOUNTER — Telehealth (INDEPENDENT_AMBULATORY_CARE_PROVIDER_SITE_OTHER): Payer: Medicaid Other | Admitting: Family Medicine

## 2022-04-02 ENCOUNTER — Encounter: Payer: Self-pay | Admitting: Family Medicine

## 2022-04-02 DIAGNOSIS — F4310 Post-traumatic stress disorder, unspecified: Secondary | ICD-10-CM

## 2022-04-02 DIAGNOSIS — S134XXA Sprain of ligaments of cervical spine, initial encounter: Secondary | ICD-10-CM

## 2022-04-02 DIAGNOSIS — R202 Paresthesia of skin: Secondary | ICD-10-CM

## 2022-04-02 MED ORDER — BACLOFEN 10 MG PO TABS: 10.0000 mg | ORAL_TABLET | Freq: Three times a day (TID) | ORAL | 1 refills | Status: DC
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
## 2022-04-02 NOTE — Assessment & Plan Note (Signed)
Continue to follow with psychiatry and counseling. Call with any concerns. Continue to monitor.  ?

## 2022-04-08 ENCOUNTER — Other Ambulatory Visit: Payer: Self-pay | Admitting: *Deleted

## 2022-04-08 NOTE — Patient Outreach (Signed)
?Medicaid Managed Care   ?Nurse Care Manager Note ? ?04/08/2022 ?Name:  Jessica Peters MRN:  [id number] DOB:  11/12/1981 ? ?Jessica Peters is an 41 y.o. year old female who is a primary patient of Valerie Roys, DO.  The [facility] Managed Care Coordination team was consulted for assistance with:    ?Chronic pain ? ?Jessica Peters was given information about Medicaid Managed Care Coordination team services today. Jessica Peters Patient agreed to services and verbal consent obtained. ? ?Engaged with patient by telephone for follow up visit in response to provider referral for case management and/or care coordination services.  ? ?Assessments/Interventions:  Review of past medical history, allergies, medications, health status, including review of consultants reports, laboratory and other test data, was performed as part of comprehensive evaluation and provision of chronic care management services. ? ?SDOH (Social Determinants of Health) assessments and interventions performed: ?SDOH Interventions   ? ?Flowsheet Row Most Recent Value  ?SDOH Interventions   ?Food Insecurity Interventions Intervention Not Indicated  ? ?  ? ? ?Care Plan ? ?No Known Allergies ? ?Medications Reviewed Today   ? ? Reviewed by Melissa Montane, RN (Registered Nurse) on 04/08/22 at 1132  Med List Status: <None>  ? ?Medication Order Taking? Sig Documenting Provider Last Dose Status Informant  ?baclofen (LIORESAL) 10 MG tablet [id number] Yes Take 1 tablet (10 mg total) by mouth 3 (three) times daily. Johnson, Megan P, DO Taking Active   ?ciclopirox (PENLAC) 8 % solution [id number] Yes APPLY TOPICALLY AT BEDTIME OVER NAIL AND SURROUNDING SKIN. APPLY DAILY OVER PREVIOUS COAT AND AFTER 7 DAYS REMOVE W/ ALCOHOL AND CONTINUE CYCLE Johnson, Megan P, DO Taking Active   ?diazepam (VALIUM) 5 MG tablet [id number] Yes Take 5 mg by mouth every 8 (eight) hours as needed for anxiety or muscle spasms. [provider] Taking Active   ?doxepin (SINEQUAN)  25 MG capsule [id number] No Take 25 mg by mouth at bedtime.  ?Patient not taking: Reported on 04/02/2022  ? [provider] Not Taking Active   ?DULoxetine (CYMBALTA) 60 MG capsule [id number] Yes Take 1 capsule (60 mg total) by mouth daily. Park Liter P, DO Taking Active   ?gabapentin (NEURONTIN) 300 MG capsule [id number] Yes Take 900 mg by mouth 3 (three) times daily. [provider] Taking Active   ?gabapentin (NEURONTIN) 400 MG capsule [id number]  Take 2 capsules (800 mg total) by mouth 3 (three) times daily. Park Liter P, DO  Expired 03/25/22 2359   ?         ?Med Note (Mehtaab Mayeda A   Mon Mar 25, 2022  9:27 AM) Taking '900mg'$  three times daily  ?HYDROcodone-acetaminophen (NORCO/VICODIN) 5-325 MG tablet [id number] Yes Take 1 tablet by mouth every 6 (six) hours as needed. [provider] Taking Active   ?lidocaine (XYLOCAINE) 5 % ointment [id number] Yes Apply 1 application topically as needed. Valerie Roys, DO Taking Active   ?         ?Med Note (Oree Mirelez A   Fri Nov 02, 2021 11:40 AM)    ?ondansetron (ZOFRAN-ODT) 4 MG disintegrating tablet [id number] Yes Take 4 mg by mouth 3 (three) times daily. [provider] Taking Active   ? ?  ?  ? ?  ? ? ?Patient Active Problem List  ? Diagnosis Date Noted  ? PTSD (post-traumatic stress disorder) 10/11/2021  ? 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 Acute traumatic injury of cervical spine ([place]) 09/01/2021  ? Status post laparoscopic  hysterectomy 12/10/2018  ? Chronic pelvic pain in female 12/04/2018  ? Allergic rhinitis 10/03/2017  ? Depression, recurrent ([place]) 10/03/2017  ? Anxiety 06/13/2017  ? Vitamin D deficiency   ? Family history of breast cancer   ? Breast lump on right side at 1 o'clock position 02/03/2017  ? ? ?Conditions to be addressed/monitored per PCP order:   chronic pain ? ?Care Plan : RN Care Manager Plan of Care  ?Updates made by Melissa Montane, RN since 04/08/2022 12:00 AM  ?  ? ?Problem: Knowledge Deficits and  Care Coordination needs related to management of pain   ?Priority: High  ?  ? ?Long-Range Goal: Development of Plan of Care to address Care Coordination needs and knowledge deficits related to managing chronic pain   ?Start Date: 10/12/2021  ?Expected End Date: 04/24/2022  ?Priority: High  ?Note:   ?Current Barriers:  ?Knowledge Deficits related to plan of care for management of PTSD and chronic pain  ?Care Coordination needs related to Financial constraints related to affording utilities and Lacks knowledge of community resource: needing a dental provider  ?Jessica Peters continues to have pain specifically-neck, right shoulder and down her arm to her hand. She has follow up with Neurosurgeon for MRI result review on 04/10/22. She has follow up with Psychiatry for PTSD and anxiety. Patient unable to work, continues to struggle with finances and waiting on disability determination.  ? ?RNCM Clinical Goal(s):  ?Patient will verbalize understanding of plan for management of Pain and PTSD as evidenced by patient verbalization and self monitoring activity ?take all medications exactly as prescribed and will call provider for medication related questions as evidenced by documentation in EMR    ?attend all scheduled medical appointments: 04/10/22 with [facility]surgery as evidenced by documentation in EMR        ?continue to work with Consulting civil engineer and/or Social Worker to address care management and care coordination needs related to pain and PTSD as evidenced by adherence to CM Team Scheduled appointments     ?work with community resource care guide to address needs related to Financial constraints related to affording utilities and Lacks knowledge of community resource: needing dental provider as evidenced by patient and/or community resource care guide support   -declined  ?Continue to work with Psychiatry for management of PTSD ? ?Interventions: ?Inter-disciplinary care team collaboration (see longitudinal plan of  care) ?Evaluation of current treatment plan related to  self management and patient's adherence to plan as established by provider ?Provided therapeutic listening ?Discussed therapeutic message for pain management-Advised by Neurosurgeon to avoid therapeutic message and Chiropractic management ? ?Pain:  (Status: Goal on Track (progressing): YES.) Long Term Goal  ?Pain assessment performed ?Medications reviewed ?Reviewed provider established plan for pain management; ?Discussed importance of adherence to all scheduled medical appointments; ?Counseled on the importance of reporting any/all new or changed pain symptoms or management strategies to pain management provider; ?Advised patient to report to care team affect of pain on daily activities; ?Discussed use of relaxation techniques and/or diversional activities to assist with pain reduction (distraction, imagery, relaxation, massage, acupressure, TENS, heat, and cold application; ?Reviewed with patient prescribed pharmacological and nonpharmacological pain relief strategies; ?Assessed social determinant of health barriers;  ? ?Patient Goals/Self-Care Activities: ?Take medications as prescribed   ?Attend all scheduled provider appointments ?Call pharmacy for medication refills 3-7 days in advance of running out of medications ?Perform all self care activities independently  ?Perform IADL's (shopping, preparing meals, housekeeping, managing finances) independently ?Call provider  office for new concerns or questions  ?call 1-800-273-TALK (toll free, 24 hour hotline) ?go to [facility] [phone]) if experiencing a Mental Health or [place]  ? ? ?  ? ? ?Follow Up:  Patient agrees to Care Plan and Follow-up. ? ?Plan: The Managed Medicaid care management team will reach out to the patient again over the next 30 days. ? ?Date/time of next scheduled RN care management/care coordination  outreach:  05/08/22 @ 10:30am ? ?[name] RN, BSN ?[place] ?RN Care Coordinator ? ?

## 2022-04-08 NOTE — Patient Instructions (Signed)
Visit Information ? ?Jessica Peters was given information about Medicaid Managed Care team care coordination services as a part of their [facility] Medicaid benefit. Jessica Peters verbally consented to engagement with the [facility] Managed Care team.  ? ?If you are experiencing a medical emergency, please call 911 or report to your local emergency department or urgent care.  ? ?If you have a non-emergency medical problem during routine business hours, please contact your provider's office and ask to speak with a nurse.  ? ?For questions related to your [facility] health plan, please call: [phone] or visit the homepage here: [email or web address] ? ?If you would like to schedule transportation through your [facility] plan, please call the following number at least 2 days in advance of your appointment: [phone] ? For information about your ride after you set it up, call Ride Assist at [phone]. Use this number to activate a Will Call pickup, or if your transportation is late for a scheduled pickup. Use this number, too, if you need to make a change or cancel a previously scheduled reservation. ? If you need transportation services right away, call [phone]. The after-hours call center is staffed 24 hours to handle ride assistance and urgent reservation requests (including discharges) 365 days a year. Urgent trips include sick visits, hospital discharge requests and life-sustaining treatment. ? ?Call the [place] at [phone], at any time, 24 hours a day, 7 days a week. If you are in danger or need immediate medical attention call 911. ? ?If you would like help to quit smoking, call 1-800-QUIT-NOW 2105767900) OR Espa?ol: 1-855-D?jelo-Ya 940-528-8117) o para m?s informaci?n haga clic aqu? or Text READY to 200-400 to register via text ? ?Jessica Peters, ? ? ?Please see education materials related to reducing stress  provided by MyChart link. ? ?Patient verbalizes understanding of instructions and care plan provided today and agrees to view in [place]. Active MyChart status confirmed with patient.   ? ?Telephone follow up appointment with Managed Medicaid care management team member scheduled for:05/08/22 @ 10:30am ? ?Jessica Joiner RN, BSN ?[place] ?RN Care Coordinator ? ? ?Following is a copy of your plan of care:  ?Care Plan : [place] of Care  ?Updates made by Melissa Montane, RN since 04/08/2022 12:00 AM  ?  ? ?Problem: Knowledge Deficits and Care Coordination needs related to management of pain   ?Priority: High  ?  ? ?Long-Range Goal: Development of Plan of Care to address Care Coordination needs and knowledge deficits related to managing chronic pain   ?Start Date: 10/12/2021  ?Expected End Date: 04/24/2022  ?Priority: High  ?Note:   ?Current Barriers:  ?Knowledge Deficits related to plan of care for management of PTSD and chronic pain  ?Care Coordination needs related to Financial constraints related to affording utilities and Lacks knowledge of community resource: needing a dental provider  ?Jessica Peters continues to have pain specifically-neck, right shoulder and down her arm to her hand. She has follow up with Neurosurgeon for MRI result review on 04/10/22. She has follow up with Psychiatry for PTSD and anxiety. Patient unable to work, continues to struggle with finances and waiting on disability determination.  ? ?RNCM Clinical Goal(s):  ?Patient will verbalize understanding of plan for management of Pain and PTSD as evidenced by patient verbalization and self monitoring activity ?take all medications exactly as prescribed and will call provider for medication related questions as evidenced by documentation in EMR    ?  attend all scheduled medical appointments: 04/10/22 with [facility]surgery as evidenced by documentation in EMR        ?continue to work with Consulting civil engineer  and/or Social Worker to address care management and care coordination needs related to pain and PTSD as evidenced by adherence to CM Team Scheduled appointments     ?work with community resource care guide to address needs related to Financial constraints related to affording utilities and Lacks knowledge of community resource: needing dental provider as evidenced by patient and/or community resource care guide support   -declined  ?Continue to work with Psychiatry for management of PTSD ? ?Interventions: ?Inter-disciplinary care team collaboration (see longitudinal plan of care) ?Evaluation of current treatment plan related to  self management and patient's adherence to plan as established by provider ?Provided therapeutic listening ?Discussed therapeutic message for pain management-Advised by Neurosurgeon to avoid therapeutic message and Chiropractic management ?Advised patient to contact [employer] for enhanced member benefits ? ?Pain:  (Status: Goal on Track (progressing): YES.) Long Term Goal  ?Pain assessment performed ?Medications reviewed ?Reviewed provider established plan for pain management; ?Discussed importance of adherence to all scheduled medical appointments; ?Counseled on the importance of reporting any/all new or changed pain symptoms or management strategies to pain management provider; ?Advised patient to report to care team affect of pain on daily activities; ?Discussed use of relaxation techniques and/or diversional activities to assist with pain reduction (distraction, imagery, relaxation, massage, acupressure, TENS, heat, and cold application; ?Reviewed with patient prescribed pharmacological and nonpharmacological pain relief strategies; ?Assessed social determinant of health barriers;  ? ?Patient Goals/Self-Care Activities: ?Take medications as prescribed   ?Attend all scheduled provider appointments ?Call pharmacy for medication refills 3-7 days in advance of running out of  medications ?Perform all self care activities independently  ?Perform IADL's (shopping, preparing meals, housekeeping, managing finances) independently ?Call provider office for new concerns or questions  ?call 1-800-273-TALK (toll free, 24 hour hotline) ?go to [facility] Urgent [facility] [place] [phone]) if experiencing a Mental Health or [place]  ? ? ?  ? ?  ?

## 2022-04-10 DIAGNOSIS — S13171D Dislocation of C6/C7 cervical vertebrae, subsequent encounter: Secondary | ICD-10-CM | POA: Diagnosis not present

## 2022-04-10 DIAGNOSIS — G959 Disease of spinal cord, unspecified: Secondary | ICD-10-CM | POA: Diagnosis not present

## 2022-04-10 DIAGNOSIS — M4312 Spondylolisthesis, cervical region: Secondary | ICD-10-CM | POA: Diagnosis not present

## 2022-04-10 DIAGNOSIS — M542 Cervicalgia: Secondary | ICD-10-CM | POA: Diagnosis not present

## 2022-04-10 DIAGNOSIS — M5412 Radiculopathy, cervical region: Secondary | ICD-10-CM | POA: Diagnosis not present

## 2022-05-08 ENCOUNTER — Other Ambulatory Visit: Payer: Self-pay | Admitting: *Deleted

## 2022-05-08 NOTE — Patient Outreach (Signed)
Care Coordination  05/08/2022  [name] Jul 26, 1981 [id number]   Medicaid Managed Care   Unsuccessful Outreach Note  05/08/2022 Name: [name] MRN: [id number] DOB: 01-28-1981  Referred by: Valerie Roys, DO Reason for referral : High Risk Managed Medicaid (Unsuccessful RNCM follow up telephone outreach)   An unsuccessful telephone outreach was attempted today. The patient was referred to the case management team for assistance with care management and care coordination.   Follow Up Plan: A HIPAA compliant phone message was left for the patient providing contact information and requesting a return call.   [name] RN, BSN [place]  [employer] manager

## 2022-05-08 NOTE — Patient Instructions (Signed)
Visit Information  Ms. Crescentia K Vermeer  - as a part of your Medicaid benefit, you are eligible for care management and care coordination services at no cost or copay. I was unable to reach you by phone today but would be happy to help you with your health related needs. Please feel free to call me @ [phone].   A member of the Managed Medicaid care management team will reach out to you again over the next 14 days.   Weylyn Ricciuti RN, BSN [place]  [facility] RN Care Coordinator   

## 2022-05-13 ENCOUNTER — Other Ambulatory Visit: Payer: Self-pay | Admitting: *Deleted

## 2022-05-13 NOTE — Patient Instructions (Signed)
Visit Information  Ms. Isabel K Kreft  - as a part of your Medicaid benefit, you are eligible for care management and care coordination services at no cost or copay. I was unable to reach you by phone today but would be happy to help you with your health related needs. Please feel free to call me @ [phone].   A member of the Managed Medicaid care management team will reach out to you again over the next 14 days.   Darolyn Double RN, BSN [place]  [facility] RN Care Coordinator   

## 2022-05-13 NOTE — Patient Outreach (Signed)
Care Coordination  05/13/2022  MAZY CULTON 1981/05/13 [id number]   Medicaid Managed Care   Unsuccessful Outreach Note  05/13/2022 Name: Jessica Peters MRN: [id number] DOB: 1981/02/14  Referred by: Valerie Roys, DO Reason for referral : High Risk Managed Medicaid (Unsuccessful RNCM follow up outreach, 2nd attempt)   A second unsuccessful telephone outreach was attempted today. The patient was referred to the case management team for assistance with care management and care coordination.   Follow Up Plan: A HIPAA compliant phone message was left for the patient providing contact information and requesting a return call.   [name] RN, BSN [place]  [employer] manager

## 2022-05-23 ENCOUNTER — Other Ambulatory Visit: Payer: Medicaid Other | Admitting: *Deleted

## 2022-05-23 ENCOUNTER — Telehealth: Payer: Self-pay | Admitting: Family Medicine

## 2022-05-23 NOTE — Patient Outreach (Signed)
Care Coordination  05/23/2022  LAKYLA BISWAS December 16, 1980 [id number]   Medicaid Managed Care   Unsuccessful Outreach Note  05/23/2022 Name: RAPHAELA CANNADAY MRN: [id number] DOB: 12/07/1980  Referred by: Valerie Roys, DO Reason for referral : Case Closure (RNCM performing Case Closure for 3 unsuccessful attempts)   Three unsuccessful telephone outreach attempts have been made. The patient was referred to the case management team for assistance with care management and care coordination. The patient's primary care provider has been notified of our unsuccessful attempts to make or maintain contact with the patient. The care management team is pleased to engage with this patient at any time in the future should he/she be interested in assistance from the care management team.   Follow Up Plan: We have been unable to make contact with the patient for follow up. The care management team is available to follow up with the patient after provider conversation with the patient regarding recommendation for care management engagement and subsequent re-referral to the care management team.   [name] RN, BSN [place] RN Care Coordinator

## 2022-05-23 NOTE — Telephone Encounter (Signed)
..   Medicaid Managed Care   Unsuccessful Outreach Note  05/23/2022 Name: Jessica Peters MRN: [id number] DOB: October 11, 1981  Referred by: Valerie Roys, DO Reason for referral : High Risk Managed Medicaid (I called the patient today to get her rescheduled with the MM RNCM. I left my name and number on her VM.)   Third unsuccessful telephone outreach was attempted today. The patient was referred to the case management team for assistance with care management and care coordination. The patient's primary care provider has been notified of our unsuccessful attempts to make or maintain contact with the patient. The care management team is pleased to engage with this patient at any time in the future should he/she be interested in assistance from the care management team.   Follow Up Plan: We have been unable to make contact with the patient for follow up. The care management team is available to follow up with the patient after provider conversation with the patient regarding recommendation for care management engagement and subsequent re-referral to the care management team.    [place], [place]

## 2022-06-12 DIAGNOSIS — S13171D Dislocation of C6/C7 cervical vertebrae, subsequent encounter: Secondary | ICD-10-CM | POA: Diagnosis not present

## 2022-06-12 DIAGNOSIS — M5412 Radiculopathy, cervical region: Secondary | ICD-10-CM | POA: Diagnosis not present

## 2022-06-12 DIAGNOSIS — M542 Cervicalgia: Secondary | ICD-10-CM | POA: Diagnosis not present

## 2022-06-12 DIAGNOSIS — G959 Disease of spinal cord, unspecified: Secondary | ICD-10-CM | POA: Diagnosis not present

## 2022-06-12 DIAGNOSIS — M4312 Spondylolisthesis, cervical region: Secondary | ICD-10-CM | POA: Diagnosis not present

## 2022-08-05 DIAGNOSIS — F332 Major depressive disorder, recurrent severe without psychotic features: Secondary | ICD-10-CM | POA: Diagnosis not present

## 2022-08-05 DIAGNOSIS — F411 Generalized anxiety disorder: Secondary | ICD-10-CM | POA: Diagnosis not present

## 2022-09-12 ENCOUNTER — Telehealth: Payer: Medicaid Other | Admitting: Family Medicine

## 2022-09-16 DIAGNOSIS — S13171D Dislocation of C6/C7 cervical vertebrae, subsequent encounter: Secondary | ICD-10-CM | POA: Diagnosis not present

## 2022-09-27 ENCOUNTER — Encounter: Payer: Self-pay | Admitting: Family Medicine

## 2022-09-27 ENCOUNTER — Telehealth (INDEPENDENT_AMBULATORY_CARE_PROVIDER_SITE_OTHER): Payer: Medicaid Other | Admitting: Family Medicine

## 2022-09-27 DIAGNOSIS — S14109A Unspecified injury at unspecified level of cervical spinal cord, initial encounter: Secondary | ICD-10-CM

## 2022-09-27 DIAGNOSIS — F419 Anxiety disorder, unspecified: Secondary | ICD-10-CM | POA: Diagnosis not present

## 2022-09-27 DIAGNOSIS — F4310 Post-traumatic stress disorder, unspecified: Secondary | ICD-10-CM | POA: Diagnosis not present

## 2022-09-27 DIAGNOSIS — F339 Major depressive disorder, recurrent, unspecified: Secondary | ICD-10-CM
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
## 2022-10-03 ENCOUNTER — Encounter: Payer: Self-pay | Admitting: Family Medicine

## 2022-10-03 NOTE — Assessment & Plan Note (Signed)
Will get her into psychiatry and see about getting her CCM help to help with stress and resources. Call with any concerns.
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
## 2022-10-08 DIAGNOSIS — F411 Generalized anxiety disorder: Secondary | ICD-10-CM | POA: Diagnosis not present

## 2022-10-08 DIAGNOSIS — F332 Major depressive disorder, recurrent severe without psychotic features: Secondary | ICD-10-CM | POA: Diagnosis not present

## 2022-10-09 ENCOUNTER — Other Ambulatory Visit: Payer: Self-pay | Admitting: Family Medicine

## 2022-10-09 NOTE — Telephone Encounter (Signed)
Requested Prescriptions  Pending Prescriptions Disp Refills   ciclopirox (PENLAC) 8 % solution [Pharmacy Med Name: CICLOPIROX 8% NAIL LACQUER TOP SOLN] 6.6 mL 0    Sig: APPLY TOPICALLY AT BEDTIME OVER NAIL AND SURROUNDING SKIN. APPLY DAILY OVER PREVIOUS COAT AND AFTER 7 DAYS REMOVE W/ ALCOHOL AND REPEAT     Off-Protocol Failed - 10/09/2022 12:06 PM      Failed - Medication not assigned to a protocol, review manually.      Passed - Valid encounter within last 12 months    Recent Outpatient Visits           1 week ago PTSD (post-traumatic stress disorder)   [place], Megan P, DO   6 months ago PTSD (post-traumatic stress disorder)   [place], Megan P, DO   7 months ago [place], DO   7 months ago No-show for appointment   [facility], Megan P, DO   8 months ago [place], [place], DO

## 2022-10-22 ENCOUNTER — Other Ambulatory Visit: Payer: Medicaid Other | Admitting: *Deleted

## 2022-10-22 NOTE — Patient Instructions (Signed)
Visit Information  Ms. [name]  - as a part of your Medicaid benefit, you are eligible for care management and care coordination services at no cost or copay. I was unable to reach you by phone today but would be happy to help you with your health related needs. Please feel free to call me @ [phone].   A member of the Managed Medicaid care management team will reach out to you again over the next 14 days.   [name] RN, BSN [place]  [employer] manager

## 2022-10-22 NOTE — Patient Outreach (Signed)
  Medicaid Managed Care   Unsuccessful Attempt Note   10/22/2022 Name: Jessica Peters MRN: [id number] DOB: [birth year]  Referred by: Valerie Roys, DO Reason for referral : High Risk Managed Medicaid (Unsuccessful RNCM telephone outreach to reestablish CM)   An unsuccessful telephone outreach was attempted today. The patient was referred to the case management team for assistance with care management and care coordination.    Follow Up Plan: A HIPAA compliant phone message was left for the patient providing contact information and requesting a return call. and The Managed Medicaid care management team will reach out to the patient again over the next 14 days.    [name] RN, BSN [place]  [employer] manager

## 2022-10-24 ENCOUNTER — Telehealth: Payer: Self-pay

## 2022-10-24 NOTE — Telephone Encounter (Signed)
..   Medicaid Managed Care Note  10/24/2022 Name: HEYLEE TANT MRN: [id number] DOB: 05/26/1981  [name] is a 41 y.o. year old female who is a primary care patient of Valerie Roys, DO and is actively engaged with the care management team. I reached out to [name] by phone today to assist with re-scheduling a follow up visit with the RN Case Manager  Follow up plan: Unsuccessful telephone outreach attempt made. A HIPAA compliant phone message was left for the patient providing contact information and requesting a return call.  The care management team will reach out to the patient again over the next 7 days.      [place]

## 2022-10-28 ENCOUNTER — Telehealth: Payer: Self-pay

## 2022-10-28 NOTE — Telephone Encounter (Signed)
..   Medicaid Managed Care Note  10/28/2022 Name: Jessica Peters MRN: [id number] DOB: [birth year]  Jessica Peters is a 41 y.o. year old female who is a primary care patient of Valerie Roys, DO and is actively engaged with the care management team. I reached out to Jessica Peters by phone today to assist with re-scheduling an initial visit with the RN Case Manager  Follow up plan: Unsuccessful telephone outreach attempt made. A HIPAA compliant phone message was left for the patient providing contact information and requesting a return call.  We have been unable to make contact with the patient for follow up. The care management team is available to follow up with the patient after provider conversation with the patient regarding recommendation for care management engagement and subsequent re-referral to the care management team.   [place], [place]

## 2022-10-30 ENCOUNTER — Encounter: Payer: Self-pay | Admitting: Family Medicine

## 2022-10-30 LAB — HM DEXA SCAN

## 2022-11-12 NOTE — Telephone Encounter (Signed)
Would need appointment and honestly this is better coming from the specialist

## 2022-11-27 ENCOUNTER — Encounter: Payer: Self-pay | Admitting: Family Medicine

## 2022-12-05 ENCOUNTER — Telehealth (INDEPENDENT_AMBULATORY_CARE_PROVIDER_SITE_OTHER): Payer: Medicaid Other | Admitting: Family Medicine

## 2022-12-05 ENCOUNTER — Encounter: Payer: Self-pay | Admitting: Family Medicine

## 2022-12-05 DIAGNOSIS — F4310 Post-traumatic stress disorder, unspecified: Secondary | ICD-10-CM

## 2022-12-05 DIAGNOSIS — F32A Depression, unspecified: Secondary | ICD-10-CM | POA: Diagnosis not present

## 2022-12-05 DIAGNOSIS — F339 Major depressive disorder, recurrent, unspecified: Secondary | ICD-10-CM

## 2022-12-05 DIAGNOSIS — F419 Anxiety disorder, unspecified: Secondary | ICD-10-CM | POA: Diagnosis not present

## 2022-12-05 MED ORDER — DULOXETINE HCL 30 MG PO CPEP: 90.0000 mg | ORAL_CAPSULE | Freq: Every day | ORAL | 1 refills | Status: DC

## 2022-12-05 MED ORDER — DIAZEPAM 5 MG PO TABS
5.0000 mg | ORAL_TABLET | Freq: Three times a day (TID) | ORAL | 0 refills | Status: DC | PRN
Start: 2022-12-05 — End: 2023-01-23

## 2022-12-05 NOTE — Progress Notes (Signed)
LMP 11/29/2015 (Exact Date)    Subjective:    Patient ID: Jessica Peters, female    DOB: Jul 14, 1981, 42 y.o.   MRN: [id number]  HPI: Jessica Peters is a 42 y.o. female  Chief Complaint  Patient presents with   Depression   ANXIETY/DEPRESSION- having issues with her psychiatrist. She notes that she has not seen her recently and would like to get into see someone new. She has not heard from the new referral yet. She has not been doing well. She took her last pill of her valium this morning Duration: chronic Status:stable Anxious mood: yes  Excessive worrying: yes Irritability: no  Sweating: no Nausea: no Palpitations:no Hyperventilation: no Panic attacks: no Agoraphobia: no  Obscessions/compulsions: no Depressed mood: no    12/05/2022   11:23 AM 09/27/2022   11:30 AM 04/02/2022    4:24 PM 02/26/2022    4:23 PM 02/01/2022   10:58 AM  Depression screen PHQ 2/9  Decreased Interest '3 3 3 2 3  '$ Down, Depressed, Hopeless '2 3 1 2 3  '$ PHQ - 2 Score '5 6 4 4 6  '$ Altered sleeping '3 3 2 3 3  '$ Tired, decreased energy '3 3 3 3 3  '$ Change in appetite '3 3 2 2 3  '$ Feeling bad or failure about yourself  '3 3 1 2 3  '$ Trouble concentrating '3 3 3 3 3  '$ Moving slowly or fidgety/restless '3 3 3 2 3  '$ Suicidal thoughts 0 0 0 0 0  PHQ-9 Score '23 24 18 19 24  '$ Difficult doing work/chores Very difficult Extremely dIfficult Extremely dIfficult        12/05/2022   11:21 AM 09/27/2022   11:32 AM 04/02/2022    4:26 PM 02/26/2022    4:24 PM  GAD 7 : Generalized Anxiety Score  Nervous, Anxious, on Edge '3 3 3 2  '$ Control/stop worrying '3 3 3 3  '$ Worry too much - different things '3 3 3 3  '$ Trouble relaxing '3 3 3 3  '$ Restless '3 3 3 3  '$ Easily annoyed or irritable '2 3 3 3  '$ Afraid - awful might happen '1 3 2 1  '$ Total GAD 7 Score '18 21 20 18  '$ Anxiety Difficulty Very difficult Extremely difficult Extremely difficult Somewhat difficult   Anhedonia: no Weight changes: no Insomnia: no   Hypersomnia: no Fatigue/loss of  energy: yes Feelings of worthlessness: no Feelings of guilt: no Impaired concentration/indecisiveness: no Suicidal ideations: no  Crying spells: no Recent Stressors/Life Changes: yes   Relationship problems: no   Family stress: yes     Financial stress: yes    Job stress: yes    Recent death/loss: no   Relevant past medical, surgical, family and social history reviewed and updated as indicated. Interim medical history since our last visit reviewed. Allergies and medications reviewed and updated.  Review of Systems  Constitutional:  Positive for fatigue. Negative for activity change, appetite change, chills, diaphoresis, fever and unexpected weight change.  HENT: Negative.    Respiratory: Negative.    Cardiovascular: Negative.   Musculoskeletal:  Positive for myalgias, neck pain and neck stiffness. Negative for arthralgias, back pain, gait problem and joint swelling.  Skin: Negative.   Neurological:  Positive for weakness, numbness and headaches. Negative for dizziness, tremors, seizures, syncope, facial asymmetry, speech difficulty and light-headedness.  Psychiatric/Behavioral:  Positive for dysphoric mood. Negative for agitation, behavioral problems, confusion, decreased concentration, hallucinations, self-injury, sleep disturbance and suicidal ideas. The patient is nervous/anxious. The patient  is not hyperactive.     Per HPI unless specifically indicated above     Objective:    LMP 11/29/2015 (Exact Date)   Wt Readings from Last 3 Encounters:  03/22/22 140 lb (63.5 kg)  02/26/22 142 lb (64.4 kg)  01/17/22 137 lb (62.1 kg)    Physical Exam Vitals and nursing note reviewed.  Constitutional:      General: She is not in acute distress.    Appearance: Normal appearance. She is not ill-appearing, toxic-appearing or diaphoretic.  HENT:     Head: Normocephalic and atraumatic.     Right Ear: External ear normal.     Left Ear: External ear normal.     Nose: Nose normal.      Mouth/Throat:     Mouth: Mucous membranes are moist.     Pharynx: Oropharynx is clear.  Eyes:     General: No scleral icterus.       Right eye: No discharge.        Left eye: No discharge.     Conjunctiva/sclera: Conjunctivae normal.     Pupils: Pupils are equal, round, and reactive to light.  Pulmonary:     Effort: Pulmonary effort is normal. No respiratory distress.     Comments: Speaking in full sentences Musculoskeletal:        General: Normal range of motion.     Cervical back: Normal range of motion.  Skin:    Coloration: Skin is not jaundiced or pale.     Findings: No bruising, erythema, lesion or rash.  Neurological:     Mental Status: She is alert and oriented to person, place, and time. Mental status is at baseline.  Psychiatric:        Mood and Affect: Mood normal.        Behavior: Behavior normal.        Thought Content: Thought content normal.        Judgment: Judgment normal.     Results for orders placed or performed during the hospital encounter of 46/27/03  Basic metabolic panel  Result Value Ref Range   Sodium 137 135 - 145 mmol/L   Potassium 3.5 3.5 - 5.1 mmol/L   Chloride 105 98 - 111 mmol/L   CO2 23 22 - 32 mmol/L   Glucose, Bld 92 70 - 99 mg/dL   BUN 11 6 - 20 mg/dL   Creatinine, Ser 0.72 0.44 - 1.00 mg/dL   Calcium 9.0 8.9 - 10.3 mg/dL   GFR, Estimated >60 >60 mL/min   Anion gap 9 5 - 15  CBC with Differential  Result Value Ref Range   WBC 7.0 4.0 - 10.5 K/uL   RBC 4.37 3.87 - 5.11 MIL/uL   Hemoglobin 13.0 12.0 - 15.0 g/dL   HCT 40.6 36.0 - 46.0 %   MCV 92.9 80.0 - 100.0 fL   MCH 29.7 26.0 - 34.0 pg   MCHC 32.0 30.0 - 36.0 g/dL   RDW 13.2 11.5 - 15.5 %   Platelets 107 (L) 150 - 400 K/uL   nRBC 0.0 0.0 - 0.2 %   Neutrophils Relative % 68 %   Neutro Abs 4.7 1.7 - 7.7 K/uL   Lymphocytes Relative 25 %   Lymphs Abs 1.8 0.7 - 4.0 K/uL   Monocytes Relative 6 %   Monocytes Absolute 0.4 0.1 - 1.0 K/uL   Eosinophils Relative 1 %   Eosinophils  Absolute 0.1 0.0 - 0.5 K/uL   Basophils Relative 0 %  Basophils Absolute 0.0 0.0 - 0.1 K/uL   Immature Granulocytes 0 %   Abs Immature Granulocytes 0.03 0.00 - 0.07 K/uL      Assessment & Plan:   Problem List Items Addressed This Visit       Other   Anxiety - Primary    Not seeing psychiatry at this time. Will put in new referral to psychiatry. Will refill her medications at this time to bridge her until she gets in with a new psychiatrist. Call with any concerns. Follow up 1 month.       Relevant Medications   DULoxetine (CYMBALTA) 30 MG capsule   diazepam (VALIUM) 5 MG tablet   Other Relevant Orders   Ambulatory referral to Psychiatry   Depression, recurrent [facility])    Not seeing psychiatry at this time. Will put in new referral to psychiatry. Will refill her medications at this time to bridge her until she gets in with a new psychiatrist. Call with any concerns. Follow up 1 month.       Relevant Medications   DULoxetine (CYMBALTA) 30 MG capsule   diazepam (VALIUM) 5 MG tablet   Other Relevant Orders   Ambulatory referral to Psychiatry   PTSD (post-traumatic stress disorder)    Not seeing psychiatry at this time. Will put in new referral to psychiatry. Will refill her medications at this time to bridge her until she gets in with a new psychiatrist. Call with any concerns. Follow up 1 month.       Relevant Medications   DULoxetine (CYMBALTA) 30 MG capsule   diazepam (VALIUM) 5 MG tablet   Other Relevant Orders   Ambulatory referral to Psychiatry     Follow up plan: Return in about 4 weeks (around 01/02/2023) for physical .   This visit was completed via video visit through MyChart due to the restrictions of the COVID-19 pandemic. All issues as above were discussed and addressed. Physical exam was done as above through visual confirmation on video through MyChart. If it was felt that the patient should be evaluated in the office, they were directed there. The patient  verbally consented to this visit. Location of the patient: home Location of the provider: work Those involved with this call:  Provider: Park Liter, DO CMA: Irena Reichmann, [place] Desk/Registration: [employer]  Time spent on call:  15 minutes with patient face to face via video conference. More than 50% of this time was spent in counseling and coordination of care. 23 minutes total spent in review of patient's record and preparation of their chart.

## 2022-12-08 ENCOUNTER — Encounter: Payer: Self-pay | Admitting: Family Medicine

## 2022-12-08 NOTE — Assessment & Plan Note (Signed)
Not seeing psychiatry at this time. Will put in new referral to psychiatry. Will refill her medications at this time to bridge her until she gets in with a new psychiatrist. Call with any concerns. Follow up 1 month.

## 2022-12-09 ENCOUNTER — Telehealth: Payer: Medicaid Other | Admitting: Family Medicine

## 2022-12-09 ENCOUNTER — Encounter: Payer: Self-pay | Admitting: Family Medicine

## 2022-12-09 NOTE — Telephone Encounter (Signed)
No. OK to print and start and I'll finish

## 2022-12-18 ENCOUNTER — Telehealth: Payer: Self-pay | Admitting: Family Medicine

## 2022-12-18 NOTE — Telephone Encounter (Signed)
Copied from [place] [id number]. Topic: General - Inquiry >> Dec 17, 2022  5:08 PM Leilani Able wrote: Reason for CRM: [employer], Apolonio Schneiders, Fax and e mail did not come thru again 5:10 1/23 Pls touch base tomorrow call rachel [phone]

## 2022-12-18 NOTE — Telephone Encounter (Signed)
Spoke with Apolonio Schneiders at [employer] and requested a new form be sent to our office as she is unable to make out the paperwork due to it being a copy of another picture from patient MyChart message.

## 2023-01-17 ENCOUNTER — Other Ambulatory Visit: Payer: Self-pay | Admitting: Family Medicine

## 2023-01-20 ENCOUNTER — Other Ambulatory Visit: Payer: Self-pay | Admitting: Family Medicine

## 2023-01-20 NOTE — Telephone Encounter (Signed)
Requested medications are due for refill today.  unsure  Requested medications are on the active medications list.  yes  Last refill. 12/05/2022 #90 0 rf  Future visit scheduled.   no  Notes to clinic.  Refill not delegated.    Requested Prescriptions  Pending Prescriptions Disp Refills   diazepam (VALIUM) 5 MG tablet [Pharmacy Med Name: DIAZEPAM '5MG'$  TABLETS] 90 tablet     Sig: TAKE 1 TABLET(5 MG) BY MOUTH EVERY 8 HOURS AS NEEDED FOR ANXIETY OR MUSCLE SPASMS     Not Delegated - Psychiatry: Anxiolytics/Hypnotics 2 Failed - 01/17/2023  5:35 PM      Failed - This refill cannot be delegated      Failed - Urine Drug Screen completed in last 360 days      Passed - Patient is not pregnant      Passed - Valid encounter within last 6 months    Recent Outpatient Visits           1 month ago [place], [place], DO   3 months ago PTSD (post-traumatic stress disorder)   [place], [place], DO   9 months ago PTSD (post-traumatic stress disorder)   [place], Megan P, DO   10 months ago [place], Megan P, DO   11 months ago No-show for appointment   [facility], [place], DO

## 2023-01-20 NOTE — Telephone Encounter (Signed)
Needs appt

## 2023-01-20 NOTE — Telephone Encounter (Signed)
Pt called and scheduled appt with Dr. Wynetta Emery for next opening on March 18th / please advise if pt can received refill for diazepam (VALIUM) 5 MG tablet  until her appt

## 2023-01-23 ENCOUNTER — Other Ambulatory Visit: Payer: Self-pay | Admitting: Family Medicine

## 2023-01-23 MED ORDER — DIAZEPAM 5 MG PO TABS: 5.0000 mg | ORAL_TABLET | Freq: Three times a day (TID) | ORAL | 0 refills | Status: DC | PRN

## 2023-02-10 ENCOUNTER — Encounter: Payer: Self-pay | Admitting: Family Medicine

## 2023-02-10 ENCOUNTER — Ambulatory Visit: Payer: Medicaid Other | Admitting: Family Medicine

## 2023-02-10 ENCOUNTER — Other Ambulatory Visit (HOSPITAL_COMMUNITY)
Admission: RE | Admit: 2023-02-10 | Discharge: 2023-02-10 | Disposition: A | Discharge status: Home / Self Care | Payer: Medicaid Other | Source: Ambulatory Visit | Attending: Family Medicine | Admitting: Family Medicine

## 2023-02-10 VITALS — BP 93/62 | HR 79 | Temp 97.9°F | Ht 69.0 in | Wt 156.2 lb

## 2023-02-10 DIAGNOSIS — F419 Anxiety disorder, unspecified: Secondary | ICD-10-CM | POA: Diagnosis not present

## 2023-02-10 DIAGNOSIS — Z1231 Encounter for screening mammogram for malignant neoplasm of breast: Secondary | ICD-10-CM

## 2023-02-10 DIAGNOSIS — E559 Vitamin D deficiency, unspecified: Secondary | ICD-10-CM

## 2023-02-10 DIAGNOSIS — R8281 Pyuria: Secondary | ICD-10-CM

## 2023-02-10 DIAGNOSIS — Z0000 Encounter for general adult medical examination without abnormal findings: Secondary | ICD-10-CM

## 2023-02-10 LAB — URINALYSIS, ROUTINE W REFLEX MICROSCOPIC
Bilirubin, UA: NEGATIVE
Glucose, UA: NEGATIVE
Ketones, UA: NEGATIVE
Nitrite, UA: POSITIVE — AB
Protein,UA: NEGATIVE
RBC, UA: NEGATIVE
Specific Gravity, UA: 1.015 (ref 1.005–1.030)
Urobilinogen, Ur: 0.2 mg/dL (ref 0.2–1.0)
pH, UA: 6.5 (ref 5.0–7.5)

## 2023-02-10 LAB — MICROSCOPIC EXAMINATION

## 2023-02-10 MED ORDER — DIAZEPAM 5 MG PO TABS
5.0000 mg | ORAL_TABLET | Freq: Three times a day (TID) | ORAL | 2 refills | Status: DC | PRN
Start: 2023-02-10 — End: 2023-05-12

## 2023-02-10 NOTE — Progress Notes (Signed)
BP 93/62   Pulse 79   Temp 97.9 F (36.6 C) (Oral)   Ht 5\' 9"  (1.753 m)   Wt 156 lb 3.2 oz (70.9 kg)   LMP 11/29/2015 (Exact Date)   SpO2 97%   BMI 23.07 kg/m    Subjective:    Patient ID: Jessica Peters, female    DOB: 1981/11/02, 42 y.o.   MRN: [id number]  HPI: Jessica Peters is a 42 y.o. female presenting on 02/10/2023 for comprehensive medical examination. Current medical complaints include:  ANXIETY/STRESS Duration: chronic Status:stable Anxious mood: yes  Excessive worrying: yes Irritability: no  Sweating: no Nausea: yes Palpitations:yes Hyperventilation: no Panic attacks: no Agoraphobia: no  Obscessions/compulsions: no Depressed mood: yes    12/05/2022   11:23 AM 09/27/2022   11:30 AM 04/02/2022    4:24 PM 02/26/2022    4:23 PM 02/01/2022   10:58 AM  Depression screen PHQ 2/9  Decreased Interest 3 3 3 2 3   Down, Depressed, Hopeless 2 3 1 2 3   PHQ - 2 Score 5 6 4 4 6   Altered sleeping 3 3 2 3 3   Tired, decreased energy 3 3 3 3 3   Change in appetite 3 3 2 2 3   Feeling bad or failure about yourself  3 3 1 2 3   Trouble concentrating 3 3 3 3 3   Moving slowly or fidgety/restless 3 3 3 2 3   Suicidal thoughts 0 0 0 0 0  PHQ-9 Score 23 24 18 19 24   Difficult doing work/chores Very difficult Extremely dIfficult Extremely dIfficult        12/05/2022   11:21 AM 09/27/2022   11:32 AM 04/02/2022    4:26 PM 02/26/2022    4:24 PM  GAD 7 : Generalized Anxiety Score  Nervous, Anxious, on Edge 3 3 3 2   Control/stop worrying 3 3 3 3   Worry too much - different things 3 3 3 3   Trouble relaxing 3 3 3 3   Restless 3 3 3 3   Easily annoyed or irritable 2 3 3 3   Afraid - awful might happen 1 3 2 1   Total GAD 7 Score 18 21 20 18   Anxiety Difficulty Very difficult Extremely difficult Extremely difficult Somewhat difficult   Anhedonia: no Weight changes: no Insomnia: no   Hypersomnia: no Fatigue/loss of energy: yes Feelings of worthlessness: yes Feelings of guilt:  yes Impaired concentration/indecisiveness: no Suicidal ideations: no  Crying spells: no Recent Stressors/Life Changes: yes   Relationship problems: no   Family stress: yes     Financial stress: yes    Job stress: yes    Recent death/loss: no  She currently lives with: daughter, son and granddaughter Menopausal Symptoms: no  Depression Screen done today and results listed below:     12/05/2022   11:23 AM 09/27/2022   11:30 AM 04/02/2022    4:24 PM 02/26/2022    4:23 PM 02/01/2022   10:58 AM  Depression screen PHQ 2/9  Decreased Interest 3 3 3 2 3   Down, Depressed, Hopeless 2 3 1 2 3   PHQ - 2 Score 5 6 4 4 6   Altered sleeping 3 3 2 3 3   Tired, decreased energy 3 3 3 3 3   Change in appetite 3 3 2 2 3   Feeling bad or failure about yourself  3 3 1 2 3   Trouble concentrating 3 3 3 3 3   Moving slowly or fidgety/restless 3 3 3 2 3   Suicidal thoughts 0 0 0  0 0  PHQ-9 Score 23 24 18 19 24   Difficult doing work/chores Very difficult Extremely dIfficult Extremely dIfficult      Past Medical History:  Past Medical History:  Diagnosis Date   Allergy    Anemia    Anxiety    BRCA negative 02/2018   MyRisk neg   Endometriosis    Family history of breast cancer 02/2018   My Risk neg   Headache    h/o migraines   Increased risk of breast cancer 02/2018   IBIS=18.8%/riskscore=26.3%   Ovarian cyst    Ovarian cyst    PTSD (post-traumatic stress disorder)    PTSD (post-traumatic stress disorder)    Thrombocytopenia (HCC)    Vitamin D deficiency     Surgical History:  Past Surgical History:  Procedure Laterality Date   ANTERIOR CERVICAL DECOMP/DISCECTOMY FUSION N/A 09/01/2021   Procedure: ANTERIOR CERVICAL DECOMPRESSION/DISCECTOMY FUSION Cervical six-Cervical seven, Open Treatment and REDUCTION OF FRACTURE;  Surgeon: Karsten Ro, DO;  Location: [place];  Service: Neurosurgery;  Laterality: N/A;   CYSTOSCOPY Bilateral 12/10/2018   Procedure: CYSTOSCOPY;  Surgeon: Will Bonnet,  MD;  Location: [facility];  Service: Gynecology;  Laterality: Bilateral;   DILITATION & CURRETTAGE/HYSTROSCOPY WITH NOVASURE ABLATION  12/07/2015   Procedure: DILATATION & CURETTAGE/HYSTEROSCOPY WITH NOVASURE ABLATION;  Surgeon: Will Bonnet, MD;  Location: [facility];  Service: Gynecology;;   LAPAROSCOPIC HYSTERECTOMY Bilateral 12/10/2018   Procedure: HYSTERECTOMY TOTAL LAPAROSCOPIC BILATERAL SALPRINGETOMY;  Surgeon: Will Bonnet, MD;  Location: [facility];  Service: Gynecology;  Laterality: Bilateral;   MOUTH SURGERY     TONSILLECTOMY     TUBAL LIGATION      Medications:  Current Outpatient Medications on File Prior to Visit  Medication Sig   ciclopirox (PENLAC) 8 % solution APPLY TOPICALLY AT BEDTIME OVER NAIL AND SURROUNDING SKIN. APPLY DAILY OVER PREVIOUS COAT AND AFTER 7 DAYS REMOVE W/ ALCOHOL AND REPEAT   DULoxetine (CYMBALTA) 30 MG capsule Take 3 capsules (90 mg total) by mouth daily.   gabapentin (NEURONTIN) 300 MG capsule Take 300 mg by mouth 3 (three) times daily.   gabapentin (NEURONTIN) 600 MG tablet Take 600 mg by mouth 3 (three) times daily.   lidocaine (XYLOCAINE) 5 % ointment Apply 1 application topically as needed.   ondansetron (ZOFRAN-ODT) 4 MG disintegrating tablet Take 4 mg by mouth 3 (three) times daily.   No current facility-administered medications on file prior to visit.    Allergies:  No Known Allergies  Social History:  Social History   Socioeconomic History   Marital status: Single    Spouse name: Not on file   Number of children: Not on file   Years of education: Not on file   Highest education level: Not on file  Occupational History   Not on file  Tobacco Use   Smoking status: Former    Packs/day: 0.50    Years: 13.00    Additional pack years: 0.00    Total pack years: 6.50    Types: Cigarettes    Quit date: 07/28/2021    Years since quitting: 1.5   Smokeless tobacco: Never  Vaping Use   Vaping Use: Never used  Substance and Sexual  Activity   Alcohol use: Not Currently   Drug use: No   Sexual activity: Yes    Birth control/protection: Surgical  Other Topics Concern   Not on file  Social History Narrative   Not on file   Social Determinants of Health  Financial Resource Strain: High Risk (11/02/2021)   Overall Financial Resource Strain (CARDIA)    Difficulty of Paying Living Expenses: Very hard  Food Insecurity: No Food Insecurity (04/08/2022)   Hunger Vital Sign    Worried About Running Out of Food in the Last Year: Never true    Ran Out of Food in the Last Year: Never true  Transportation Needs: No Transportation Needs (03/25/2022)   PRAPARE - Hydrologist (Medical): No    Lack of Transportation (Non-Medical): No  Physical Activity: Not on file  Stress: Stress Concern Present (03/25/2022)   [place]    Feeling of Stress : Very much  Social Connections: Not on file  Intimate Partner Violence: Not on file   Social History   Tobacco Use  Smoking Status Former   Packs/day: 0.50   Years: 13.00   Additional pack years: 0.00   Total pack years: 6.50   Types: Cigarettes   Quit date: 07/28/2021   Years since quitting: 1.5  Smokeless Tobacco Never   Social History   Substance and Sexual Activity  Alcohol Use Not Currently    Family History:  Family History  Problem Relation Age of Onset   Depression Mother    Depression Sister    Polycythemia Sister    Alcohol abuse Maternal Grandmother    Stroke Maternal Grandmother    Depression Maternal Grandmother    Cancer Maternal Grandmother        Liver   Alcohol abuse Maternal Grandfather    Stroke Maternal Grandfather    Cancer Maternal Grandfather        Liver   Breast cancer Paternal Grandmother 46   Stroke Paternal Grandmother    Clotting disorder Paternal Grandmother    Cancer Paternal Grandmother        Breast   Stroke Paternal Grandfather    Heart  disease Paternal Grandfather    Breast cancer Paternal Aunt 17   Cancer Paternal Aunt        Breast    Past medical history, surgical history, medications, allergies, family history and social history reviewed with patient today and changes made to appropriate areas of the chart.   Review of Systems  Constitutional:  Positive for chills and malaise/fatigue. Negative for diaphoresis, fever and weight loss.  HENT:  Positive for hearing loss. Negative for congestion, ear discharge, ear pain, nosebleeds, sinus pain, sore throat and tinnitus.   Eyes:  Positive for blurred vision. Negative for double vision, photophobia, pain, discharge and redness.  Respiratory: Negative.  Negative for stridor.   Cardiovascular:  Positive for leg swelling. Negative for chest pain, palpitations, orthopnea, claudication and PND.  Gastrointestinal: Negative.   Genitourinary:  Positive for frequency and urgency. Negative for dysuria, flank pain and hematuria.  Musculoskeletal:  Positive for myalgias and neck pain. Negative for back pain, falls and joint pain.  Skin: Negative.   Neurological:  Positive for dizziness and tremors. Negative for tingling, sensory change, speech change, focal weakness, seizures, loss of consciousness, weakness and headaches.  Endo/Heme/Allergies:  Positive for polydipsia. Negative for environmental allergies. Bruises/bleeds easily.  Psychiatric/Behavioral:  Positive for depression. Negative for hallucinations, memory loss, substance abuse and suicidal ideas. The patient is nervous/anxious. The patient does not have insomnia.    All other ROS negative except what is listed above and in the HPI.      Objective:    BP 93/62   Pulse 79  Temp 97.9 F (36.6 C) (Oral)   Ht 5\' 9"  (1.753 m)   Wt 156 lb 3.2 oz (70.9 kg)   LMP 11/29/2015 (Exact Date)   SpO2 97%   BMI 23.07 kg/m   Wt Readings from Last 3 Encounters:  02/10/23 156 lb 3.2 oz (70.9 kg)  03/22/22 140 lb (63.5 kg)  02/26/22  142 lb (64.4 kg)    Physical Exam Vitals and nursing note reviewed.  Constitutional:      General: She is not in acute distress.    Appearance: Normal appearance. She is normal weight. She is not ill-appearing, toxic-appearing or diaphoretic.  HENT:     Head: Normocephalic and atraumatic.     Right Ear: Tympanic membrane, ear canal and external ear normal. There is no impacted cerumen.     Left Ear: Tympanic membrane, ear canal and external ear normal. There is no impacted cerumen.     Nose: Nose normal. No congestion or rhinorrhea.     Mouth/Throat:     Mouth: Mucous membranes are moist.     Pharynx: Oropharynx is clear. No oropharyngeal exudate or posterior oropharyngeal erythema.  Eyes:     General: No scleral icterus.       Right eye: No discharge.        Left eye: No discharge.     Extraocular Movements: Extraocular movements intact.     Conjunctiva/sclera: Conjunctivae normal.     Pupils: Pupils are equal, round, and reactive to light.  Neck:     Vascular: No carotid bruit.  Cardiovascular:     Rate and Rhythm: Normal rate and regular rhythm.     Pulses: Normal pulses.     Heart sounds: No murmur heard.    No friction rub. No gallop.  Pulmonary:     Effort: Pulmonary effort is normal. No respiratory distress.     Breath sounds: Normal breath sounds. No stridor. No wheezing, rhonchi or rales.  Chest:     Chest wall: No tenderness.  Abdominal:     General: Abdomen is flat. Bowel sounds are normal. There is no distension.     Palpations: Abdomen is soft. There is no mass.     Tenderness: There is no abdominal tenderness. There is no right CVA tenderness, left CVA tenderness, guarding or rebound.     Hernia: No hernia is present.  Musculoskeletal:        General: No swelling, tenderness, deformity or signs of injury.     Cervical back: Normal range of motion and neck supple. No rigidity. No muscular tenderness.     Right lower leg: No edema.     Left lower leg: No edema.   Lymphadenopathy:     Cervical: No cervical adenopathy.  Skin:    General: Skin is warm and dry.     Capillary Refill: Capillary refill takes less than 2 seconds.     Coloration: Skin is not jaundiced or pale.     Findings: No bruising, erythema, lesion or rash.  Neurological:     General: No focal deficit present.     Mental Status: She is alert and oriented to person, place, and time. Mental status is at baseline.     Cranial Nerves: No cranial nerve deficit.     Sensory: No sensory deficit.     Motor: No weakness.     Coordination: Coordination normal.     Gait: Gait normal.     Deep Tendon Reflexes: Reflexes normal.  Psychiatric:  Mood and Affect: Mood normal.        Behavior: Behavior normal.        Thought Content: Thought content normal.        Judgment: Judgment normal.     Results for orders placed or performed in visit on 12/17/22  HM DEXA SCAN  Result Value Ref Range   HM Dexa Scan See Attached Report       Assessment & Plan:   Problem List Items Addressed This Visit       Other   Vitamin D deficiency    Rechecking labs today. Await results. Treat as needed .      Relevant Orders   VITAMIN D 25 Hydroxy (Vit-D Deficiency, Fractures)   Anxiety    Under good control on current regimen. Continue current regimen. Continue to monitor. Call with any concerns. Refills given for 3 months. Follow up 3 months.        Relevant Medications   diazepam (VALIUM) 5 MG tablet   Other Relevant Orders   [id number]+Oxyco+Alc+Crt-Bund   Other Visit Diagnoses     Routine general medical examination at a health care facility    -  Primary   Vaccines up to date/declined. Screening labs checked today. Pap done. Mammogram ordered. Continue diet and exercise. Call with any concerns.   Relevant Orders   CBC with Differential/Platelet   Comprehensive metabolic panel   Lipid Panel w/o Chol/HDL Ratio   Cytology - PAP   Urinalysis, Routine w reflex microscopic   TSH    Hepatitis C Antibody   Encounter for screening mammogram for malignant neoplasm of breast       Mammogram ordered today.   Relevant Orders   MM 3D SCREENING MAMMOGRAM BILATERAL BREAST   Pyuria       Will check urine culture. Await results.   Relevant Orders   Urine Culture        Follow up plan: Return in about 3 months (around 05/13/2023) for virtual OK.   LABORATORY TESTING:  - Pap smear: pap done  IMMUNIZATIONS:   - Tdap: Tetanus vaccination status reviewed: last tetanus booster within 10 years. - Influenza: Refused - Pneumovax: Not applicable - Prevnar: Not applicable - COVID: Refused - HPV: Not applicable - Shingrix vaccine: Not applicable  SCREENING: -Mammogram: Ordered today   PATIENT COUNSELING:   Advised to take 1 mg of folate supplement per day if capable of pregnancy.   Sexuality: Discussed sexually transmitted diseases, partner selection, use of condoms, avoidance of unintended pregnancy  and contraceptive alternatives.   Advised to avoid cigarette smoking.  I discussed with the patient that most people either abstain from alcohol or drink within safe limits (<=14/week and <=4 drinks/occasion for males, <=7/weeks and <= 3 drinks/occasion for females) and that the risk for alcohol disorders and other health effects rises proportionally with the number of drinks per week and how often a drinker exceeds daily limits.  Discussed cessation/primary prevention of drug use and availability of treatment for abuse.   Diet: Encouraged to adjust caloric intake to maintain  or achieve ideal body weight, to reduce intake of dietary saturated fat and total fat, to limit sodium intake by avoiding high sodium foods and not adding table salt, and to maintain adequate dietary potassium and calcium preferably from fresh fruits, vegetables, and low-fat dairy products.    stressed the importance of regular exercise  Injury prevention: Discussed safety belts, safety helmets, smoke  detector, smoking near bedding or upholstery.  Dental health: Discussed importance of regular tooth brushing, flossing, and dental visits.    NEXT PREVENTATIVE PHYSICAL DUE IN 1 YEAR. Return in about 3 months (around 05/13/2023) for virtual OK.

## 2023-02-10 NOTE — Patient Instructions (Signed)
Please call to schedule your mammogram: [facility]  Address: [address], [place], [place] [address] Phone: [phone]  [facility] [place] [address] [place],  [place]  [address] Phone: [phone]   

## 2023-02-10 NOTE — Assessment & Plan Note (Signed)
Under good control on current regimen. Continue current regimen. Continue to monitor. Call with any concerns. Refills given for 3 months. Follow up 3 months.    

## 2023-02-10 NOTE — Assessment & Plan Note (Signed)
Rechecking labs today. Await results. Treat as needed.  °

## 2023-02-11 LAB — COMPREHENSIVE METABOLIC PANEL
ALT: 17 IU/L (ref 0–32)
AST: 25 IU/L (ref 0–40)
Albumin/Globulin Ratio: 2.2 (ref 1.2–2.2)
Albumin: 4.8 g/dL (ref 3.9–4.9)
Alkaline Phosphatase: 37 IU/L — ABNORMAL LOW (ref 44–121)
BUN/Creatinine Ratio: 19 (ref 9–23)
BUN: 14 mg/dL (ref 6–24)
Bilirubin Total: 0.4 mg/dL (ref 0.0–1.2)
CO2: 24 mmol/L (ref 20–29)
Calcium: 9.4 mg/dL (ref 8.7–10.2)
Chloride: 103 mmol/L (ref 96–106)
Creatinine, Ser: 0.73 mg/dL (ref 0.57–1.00)
Globulin, Total: 2.2 g/dL (ref 1.5–4.5)
Glucose: 79 mg/dL (ref 70–99)
Potassium: 3.8 mmol/L (ref 3.5–5.2)
Sodium: 139 mmol/L (ref 134–144)
Total Protein: 7 g/dL (ref 6.0–8.5)
eGFR: 107 mL/min/{1.73_m2} (ref >59)

## 2023-02-11 LAB — CBC WITH DIFFERENTIAL/PLATELET
Basophils Absolute: 0 10*3/uL (ref 0.0–0.2)
Basos: 1 %
EOS (ABSOLUTE): 0.2 10*3/uL (ref 0.0–0.4)
Eos: 4 %
Hematocrit: 40.3 % (ref 34.0–46.6)
Hemoglobin: 13.4 g/dL (ref 11.1–15.9)
Immature Grans (Abs): 0 10*3/uL (ref 0.0–0.1)
Immature Granulocytes: 0 %
Lymphocytes Absolute: 2.3 10*3/uL (ref 0.7–3.1)
Lymphs: 38 %
MCH: 30.7 pg (ref 26.6–33.0)
MCHC: 33.3 g/dL (ref 31.5–35.7)
MCV: 92 fL (ref 79–97)
Monocytes Absolute: 0.4 10*3/uL (ref 0.1–0.9)
Monocytes: 6 %
Neutrophils Absolute: 3.1 10*3/uL (ref 1.4–7.0)
Neutrophils: 51 %
Platelets: 126 10*3/uL — ABNORMAL LOW (ref 150–450)
RBC: 4.36 x10E6/uL (ref 3.77–5.28)
RDW: 12.9 % (ref 11.7–15.4)
WBC: 6 10*3/uL (ref 3.4–10.8)

## 2023-02-11 LAB — LIPID PANEL W/O CHOL/HDL RATIO
Cholesterol, Total: 154 mg/dL (ref 100–199)
HDL: 82 mg/dL (ref >39)
LDL Chol Calc (NIH): 61 mg/dL (ref 0–99)
Triglycerides: 53 mg/dL (ref 0–149)
VLDL Cholesterol Cal: 11 mg/dL (ref 5–40)

## 2023-02-11 LAB — DRUG SCREEN 764883 11+OXYCO+ALC+CRT-BUND

## 2023-02-11 LAB — HEPATITIS C ANTIBODY: Hep C Virus Ab: NONREACTIVE

## 2023-02-11 LAB — VITAMIN D 25 HYDROXY (VIT D DEFICIENCY, FRACTURES): Vit D, 25-Hydroxy: 38.1 ng/mL (ref 30.0–100.0)

## 2023-02-11 LAB — TSH: TSH: 0.805 u[IU]/mL (ref 0.450–4.500)

## 2023-02-12 ENCOUNTER — Other Ambulatory Visit: Payer: Self-pay | Admitting: Family Medicine

## 2023-02-12 MED ORDER — NITROFURANTOIN MONOHYD MACRO 100 MG PO CAPS: 100.0000 mg | ORAL_CAPSULE | Freq: Two times a day (BID) | ORAL | 0 refills | Status: DC

## 2023-02-13 ENCOUNTER — Other Ambulatory Visit: Payer: Self-pay | Admitting: Family Medicine

## 2023-02-13 LAB — URINE CULTURE

## 2023-02-14 LAB — BENZODIAZEPINES CONFIRM, URINE
Alprazolam: NEGATIVE
Benzodiazepines: POSITIVE ng/mL — AB
Clonazepam: NEGATIVE
Flurazepam: NEGATIVE
Lorazepam: NEGATIVE
Midazolam: NEGATIVE
Nordiazepam Conf: 291 ng/mL
Nordiazepam: POSITIVE — AB
Oxazepam Conf: 400 ng/mL
Oxazepam: POSITIVE — AB
Temazepam Conf.: 366 ng/mL
Temazepam: POSITIVE — AB
Triazolam: NEGATIVE

## 2023-02-14 LAB — DRUG SCREEN 764883 11+OXYCO+ALC+CRT-BUND
Amphetamines, Urine: NEGATIVE ng/mL
Barbiturate: NEGATIVE ng/mL
Cocaine (Metabolite): NEGATIVE ng/mL
Creatinine: 25.8 mg/dL (ref 20.0–300.0)
Ethanol: NEGATIVE %
Meperidine: NEGATIVE ng/mL
Methadone Screen, Urine: NEGATIVE ng/mL
OPIATE SCREEN URINE: NEGATIVE ng/mL
Oxycodone/Oxymorphone, Urine: NEGATIVE ng/mL
Phencyclidine: NEGATIVE ng/mL
Propoxyphene: NEGATIVE ng/mL
Tramadol: NEGATIVE ng/mL
pH, Urine: 6.1 (ref 4.5–8.9)

## 2023-02-14 LAB — CYTOLOGY - PAP
Comment: NEGATIVE
Diagnosis: NEGATIVE
High risk HPV: NEGATIVE

## 2023-02-14 LAB — CANNABINOID CONFIRMATION, UR
CANNABINOIDS: POSITIVE — AB
Carboxy THC GC/MS Conf: 66 ng/mL

## 2023-02-14 NOTE — Telephone Encounter (Signed)
Requested Prescriptions  Pending Prescriptions Disp Refills   lidocaine (XYLOCAINE) 5 % ointment [Pharmacy Med Name: LIDOCAINE 5% TOP/ORAL OINT (SPRMT)] 50 g 0    Sig: APPLY TO THE AFFECTED AREA AS NEEDED     Analgesics:  Topicals Failed - 02/13/2023  7:43 PM      Failed - Manual Review: Labs are only required if the patient has taken medication for more than 8 weeks.      Failed - PLT in normal range and within 360 days    Platelets  Date Value Ref Range Status  02/10/2023 126 (L) 150 - 450 x10E3/uL Final         Passed - HGB in normal range and within 360 days    Hemoglobin  Date Value Ref Range Status  02/10/2023 13.4 11.1 - 15.9 g/dL Final         Passed - HCT in normal range and within 360 days    Hematocrit  Date Value Ref Range Status  02/10/2023 40.3 34.0 - 46.6 % Final         Passed - Cr in normal range and within 360 days    Creatinine  Date Value Ref Range Status  02/10/2023 WILL FOLLOW  Preliminary  03/31/2012 0.63 0.60 - 1.30 mg/dL Final   Creatinine, Ser  Date Value Ref Range Status  02/10/2023 0.73 0.57 - 1.00 mg/dL Final         Passed - eGFR is 30 or above and within 360 days    EGFR (African American)  Date Value Ref Range Status  03/31/2012 >60  Final   GFR calc Af Amer  Date Value Ref Range Status  05/24/2020 >60 >60 mL/min Final   EGFR (Non-African Amer.)  Date Value Ref Range Status  03/31/2012 >60  Final    Comment:    eGFR values <44mL/min/1.73 m2 may be an indication of chronic kidney disease (CKD). Calculated eGFR is useful in patients with stable renal function. The eGFR calculation will not be reliable in acutely ill patients when serum creatinine is changing rapidly. It is not useful in  patients on dialysis. The eGFR calculation may not be applicable to patients at the low and high extremes of body sizes, pregnant women, and vegetarians.    GFR, Estimated  Date Value Ref Range Status  03/22/2022 >60 >60 mL/min Final     Comment:    (NOTE) Calculated using the CKD-EPI Creatinine Equation (2021)    eGFR  Date Value Ref Range Status  02/10/2023 107 >59 mL/min/1.73 Final         Passed - Patient is not pregnant      Passed - Valid encounter within last 12 months    Recent Outpatient Visits           4 days ago Routine general medical examination at a health care facility   [place], Megan P, DO   2 months ago [place], [place] P, DO   4 months ago PTSD (post-traumatic stress disorder)   [place], Megan P, DO   10 months ago PTSD (post-traumatic stress disorder)   [place], Megan P, DO   11 months ago [place], DO       Future Appointments             In 2 months Wynetta Emery,  [place], [facility]

## 2023-02-16 ENCOUNTER — Other Ambulatory Visit: Payer: Self-pay | Admitting: Family Medicine

## 2023-02-16 MED ORDER — METRONIDAZOLE 500 MG PO TABS: 500.0000 mg | ORAL_TABLET | Freq: Two times a day (BID) | ORAL | 0 refills | Status: DC

## 2023-03-24 DIAGNOSIS — M5412 Radiculopathy, cervical region: Secondary | ICD-10-CM | POA: Diagnosis not present

## 2023-03-24 DIAGNOSIS — M542 Cervicalgia: Secondary | ICD-10-CM | POA: Diagnosis not present

## 2023-04-24 ENCOUNTER — Ambulatory Visit
Admission: RE | Admit: 2023-04-24 | Discharge: 2023-04-24 | Disposition: A | Discharge status: Home / Self Care | Payer: Medicaid Other | Source: Ambulatory Visit | Attending: Family Medicine | Admitting: Family Medicine

## 2023-04-24 DIAGNOSIS — Z1231 Encounter for screening mammogram for malignant neoplasm of breast: Secondary | ICD-10-CM | POA: Insufficient documentation

## 2023-05-06 ENCOUNTER — Other Ambulatory Visit: Payer: Self-pay | Admitting: Family Medicine

## 2023-05-06 NOTE — Telephone Encounter (Signed)
Medication Refill - Medication: DULoxetine (CYMBALTA) 30 MG capsule   Has the patient contacted their pharmacy? Yes.   (Agent: If no, request that the patient contact the pharmacy for the refill. If patient does not wish to contact the pharmacy document the reason why and proceed with request.) (Agent: If yes, when and what did the pharmacy advise?)  Preferred Pharmacy (with phone number or street name):  [id number] [place]ree Ditto, [place] - [address] AT [facility] OF [address] [facility] Phone: [phone]  Fax: [phone]     Has the patient been seen for an appointment in the last year OR does the patient have an upcoming appointment? Yes.    Agent: Please be advised that RX refills may take up to 3 business days. We ask that you follow-up with your pharmacy.

## 2023-05-07 MED ORDER — DULOXETINE HCL 30 MG PO CPEP: 90.0000 mg | ORAL_CAPSULE | Freq: Every day | ORAL | 0 refills | Status: DC

## 2023-05-07 NOTE — Telephone Encounter (Signed)
Requested Prescriptions  Pending Prescriptions Disp Refills   DULoxetine (CYMBALTA) 30 MG capsule 270 capsule 0    Sig: Take 3 capsules (90 mg total) by mouth daily.     Psychiatry: Antidepressants - SNRI - duloxetine Passed - 05/06/2023 10:22 AM      Passed - Cr in normal range and within 360 days    Creatinine  Date Value Ref Range Status  02/10/2023 25.8 20.0 - 300.0 mg/dL Final  82/95/6213 0.86 0.60 - 1.30 mg/dL Final   Creatinine, Ser  Date Value Ref Range Status  02/10/2023 0.73 0.57 - 1.00 mg/dL Final         Passed - eGFR is 30 or above and within 360 days    EGFR (African American)  Date Value Ref Range Status  03/31/2012 >60  Final   GFR calc Af Amer  Date Value Ref Range Status  05/24/2020 >60 >60 mL/min Final   EGFR (Non-African Amer.)  Date Value Ref Range Status  03/31/2012 >60  Final    Comment:    eGFR values <8mL/min/1.73 m2 may be an indication of chronic kidney disease (CKD). Calculated eGFR is useful in patients with stable renal function. The eGFR calculation will not be reliable in acutely ill patients when serum creatinine is changing rapidly. It is not useful in  patients on dialysis. The eGFR calculation may not be applicable to patients at the low and high extremes of body sizes, pregnant women, and vegetarians.    GFR, Estimated  Date Value Ref Range Status  03/22/2022 >60 >60 mL/min Final    Comment:    (NOTE) Calculated using the CKD-EPI Creatinine Equation (2021)    eGFR  Date Value Ref Range Status  02/10/2023 107 >59 mL/min/1.73 Final         Passed - Completed PHQ-2 or PHQ-9 in the last 360 days      Passed - Last BP in normal range    BP Readings from Last 1 Encounters:  02/10/23 93/62         Passed - Valid encounter within last 6 months    Recent Outpatient Visits           2 months ago Routine general medical examination at a health care facility   [facility] [place], Megan P, DO   5  months ago Anxiety   [facility] [place], [place] P, DO   7 months ago PTSD (post-traumatic stress disorder)   [facility] [place], Megan P, DO   1 year ago PTSD (post-traumatic stress disorder)   [facility] [place], Megan P, DO   1 year ago Anxiety   [facility] [place], Oralia Rud, DO       Future Appointments             In 5 days Dorcas Carrow, DO [facility], [facility]

## 2023-05-12 ENCOUNTER — Encounter: Payer: Self-pay | Admitting: Family Medicine

## 2023-05-12 ENCOUNTER — Telehealth (INDEPENDENT_AMBULATORY_CARE_PROVIDER_SITE_OTHER): Payer: Medicaid Other | Admitting: Family Medicine

## 2023-05-12 DIAGNOSIS — M79671 Pain in right foot: Secondary | ICD-10-CM | POA: Diagnosis not present

## 2023-05-12 DIAGNOSIS — M79672 Pain in left foot: Secondary | ICD-10-CM | POA: Diagnosis not present

## 2023-05-12 DIAGNOSIS — F419 Anxiety disorder, unspecified: Secondary | ICD-10-CM

## 2023-05-12 DIAGNOSIS — F4310 Post-traumatic stress disorder, unspecified: Secondary | ICD-10-CM | POA: Diagnosis not present

## 2023-05-12 DIAGNOSIS — F339 Major depressive disorder, recurrent, unspecified: Secondary | ICD-10-CM | POA: Diagnosis not present

## 2023-05-12 MED ORDER — DULOXETINE HCL 30 MG PO CPEP: 90.0000 mg | ORAL_CAPSULE | Freq: Every day | ORAL | 1 refills | Status: DC

## 2023-05-12 MED ORDER — DIAZEPAM 5 MG PO TABS
5.0000 mg | ORAL_TABLET | Freq: Three times a day (TID) | ORAL | 2 refills | Status: DC | PRN
Start: 2023-05-12 — End: 2023-09-03

## 2023-05-12 NOTE — Assessment & Plan Note (Signed)
Under good control on current regimen. Continue current regimen. Continue to monitor. Call with any concerns. Refills given for 3 months. Follow up 3 months.    

## 2023-05-12 NOTE — Progress Notes (Signed)
LMP 11/29/2015 (Exact Date)    Subjective:    Patient ID: Jessica Peters, female    DOB: [birth year], 42 y.o.   MRN: [id number]  HPI: Jessica Peters is a 42 y.o. female  Chief Complaint  Patient presents with   Anxiety   Foot Problem    Patient says she would like to discuss a referral to Podiatry as she is having pain with standing for long periods of time.    Feels like her arches are falling and would like to see podiatry.  ANXIETY/PTSD Duration: chronic Status:stable Anxious mood: yes  Excessive worrying: no Irritability: no  Sweating: no Nausea: no Palpitations:no Hyperventilation: no Panic attacks: no Agoraphobia: no  Obscessions/compulsions: no Depressed mood: yes    05/12/2023    8:27 AM 12/05/2022   11:23 AM 09/27/2022   11:30 AM 04/02/2022    4:24 PM 02/26/2022    4:23 PM  Depression screen PHQ 2/9  Decreased Interest 0 3 3 3 2   Down, Depressed, Hopeless 0 2 3 1 2   PHQ - 2 Score 0 5 6 4 4   Altered sleeping 3 3 3 2 3   Tired, decreased energy 0 3 3 3 3   Change in appetite 3 3 3 2 2   Feeling bad or failure about yourself  0 3 3 1 2   Trouble concentrating 0 3 3 3 3   Moving slowly or fidgety/restless 0 3 3 3 2   Suicidal thoughts 0 0 0 0 0  PHQ-9 Score 6 23 24 18 19   Difficult doing work/chores Not difficult at all Very difficult Extremely dIfficult Extremely dIfficult       05/12/2023    8:28 AM 12/05/2022   11:21 AM 09/27/2022   11:32 AM 04/02/2022    4:26 PM  GAD 7 : Generalized Anxiety Score  Nervous, Anxious, on Edge 0 3 3 3   Control/stop worrying 0 3 3 3   Worry too much - different things 0 3 3 3   Trouble relaxing 0 3 3 3   Restless 3 3 3 3   Easily annoyed or irritable 0 2 3 3   Afraid - awful might happen 0 1 3 2   Total GAD 7 Score 3 18 21 20   Anxiety Difficulty Not difficult at all Very difficult Extremely difficult Extremely difficult   Anhedonia: no Weight changes: no Insomnia: no   Hypersomnia: no Fatigue/loss of energy: no Feelings of  worthlessness: no Feelings of guilt: no Impaired concentration/indecisiveness: no Suicidal ideations: no  Crying spells: no Recent Stressors/Life Changes: yes   Relationship problems: no   Family stress: yes     Financial stress: yes    Job stress: no    Recent death/loss: no  Relevant past medical, surgical, family and social history reviewed and updated as indicated. Interim medical history since our last visit reviewed. Allergies and medications reviewed and updated.  Review of Systems  Constitutional: Negative.   Respiratory: Negative.    Cardiovascular: Negative.   Gastrointestinal: Negative.   Musculoskeletal: Negative.   Neurological: Negative.   Psychiatric/Behavioral: Negative.      Per HPI unless specifically indicated above     Objective:    LMP 11/29/2015 (Exact Date)   Wt Readings from Last 3 Encounters:  02/10/23 156 lb 3.2 oz (70.9 kg)  03/22/22 140 lb (63.5 kg)  02/26/22 142 lb (64.4 kg)    Physical Exam Vitals and nursing note reviewed.  Constitutional:      General: She is not in acute distress.  Appearance: Normal appearance. She is normal weight. She is not ill-appearing, toxic-appearing or diaphoretic.  HENT:     Head: Normocephalic and atraumatic.     Right Ear: External ear normal.     Left Ear: External ear normal.     Nose: Nose normal.     Mouth/Throat:     Mouth: Mucous membranes are moist.     Pharynx: Oropharynx is clear.  Eyes:     General: No scleral icterus.       Right eye: No discharge.        Left eye: No discharge.     Conjunctiva/sclera: Conjunctivae normal.     Pupils: Pupils are equal, round, and reactive to light.  Pulmonary:     Effort: Pulmonary effort is normal. No respiratory distress.     Comments: Speaking in full sentences Musculoskeletal:        General: Normal range of motion.     Cervical back: Normal range of motion.  Skin:    Coloration: Skin is not jaundiced or pale.     Findings: No bruising,  erythema, lesion or rash.  Neurological:     Mental Status: She is alert and oriented to person, place, and time. Mental status is at baseline.  Psychiatric:        Mood and Affect: Mood normal.        Behavior: Behavior normal.        Thought Content: Thought content normal.        Judgment: Judgment normal.     Results for orders placed or performed in visit on 02/10/23  Urine Culture   Specimen: Urine   UR  Result Value Ref Range   Urine Culture, Routine Final report (A)    Organism ID, Bacteria Escherichia coli (A)    Antimicrobial Susceptibility Comment   Microscopic Examination   Urine  Result Value Ref Range   WBC, UA 0-5 0 - 5 /hpf   RBC, Urine 0-2 0 - 2 /hpf   Epithelial Cells (non renal) >10E 0 - 10 /hpf   Bacteria, UA Moderate (A) None seen/Few  CBC with Differential/Platelet  Result Value Ref Range   WBC 6.0 3.4 - 10.8 x10E3/uL   RBC 4.36 3.77 - 5.28 x10E6/uL   Hemoglobin 13.4 11.1 - 15.9 g/dL   Hematocrit 19.1 47.8 - 46.6 %   MCV 92 79 - 97 fL   MCH 30.7 26.6 - 33.0 pg   MCHC 33.3 31.5 - 35.7 g/dL   RDW 29.5 62.1 - 30.8 %   Platelets 126 (L) 150 - 450 x10E3/uL   Neutrophils 51 Not Estab. %   Lymphs 38 Not Estab. %   Monocytes 6 Not Estab. %   Eos 4 Not Estab. %   Basos 1 Not Estab. %   Neutrophils Absolute 3.1 1.4 - 7.0 x10E3/uL   Lymphocytes Absolute 2.3 0.7 - 3.1 x10E3/uL   Monocytes Absolute 0.4 0.1 - 0.9 x10E3/uL   EOS (ABSOLUTE) 0.2 0.0 - 0.4 x10E3/uL   Basophils Absolute 0.0 0.0 - 0.2 x10E3/uL   Immature Granulocytes 0 Not Estab. %   Immature Grans (Abs) 0.0 0.0 - 0.1 x10E3/uL  Comprehensive metabolic panel  Result Value Ref Range   Glucose 79 70 - 99 mg/dL   BUN 14 6 - 24 mg/dL   Creatinine, Ser 6.57 0.57 - 1.00 mg/dL   eGFR 846 >96 EX/BMW/4.13   BUN/Creatinine Ratio 19 9 - 23   Sodium 139 134 - 144 mmol/L  Potassium 3.8 3.5 - 5.2 mmol/L   Chloride 103 96 - 106 mmol/L   CO2 24 20 - 29 mmol/L   Calcium 9.4 8.7 - 10.2 mg/dL   Total  Protein 7.0 6.0 - 8.5 g/dL   Albumin 4.8 3.9 - 4.9 g/dL   Globulin, Total 2.2 1.5 - 4.5 g/dL   Albumin/Globulin Ratio 2.2 1.2 - 2.2   Bilirubin Total 0.4 0.0 - 1.2 mg/dL   Alkaline Phosphatase 37 (L) 44 - 121 IU/L   AST 25 0 - 40 IU/L   ALT 17 0 - 32 IU/L  Lipid Panel w/o Chol/HDL Ratio  Result Value Ref Range   Cholesterol, Total 154 100 - 199 mg/dL   Triglycerides 53 0 - 149 mg/dL   HDL 82 >16 mg/dL   VLDL Cholesterol Cal 11 5 - 40 mg/dL   LDL Chol Calc (NIH) 61 0 - 99 mg/dL  Urinalysis, Routine w reflex microscopic  Result Value Ref Range   Specific Gravity, UA 1.015 1.005 - 1.030   pH, UA 6.5 5.0 - 7.5   Color, UA Yellow Yellow   Appearance Ur Cloudy (A) Clear   Leukocytes,UA Trace (A) Negative   Protein,UA Negative Negative/Trace   Glucose, UA Negative Negative   Ketones, UA Negative Negative   RBC, UA Negative Negative   Bilirubin, UA Negative Negative   Urobilinogen, Ur 0.2 0.2 - 1.0 mg/dL   Nitrite, UA Positive (A) Negative   Microscopic Examination See below:   TSH  Result Value Ref Range   TSH 0.805 0.450 - 4.500 uIU/mL  VITAMIN D 25 Hydroxy (Vit-D Deficiency, Fractures)  Result Value Ref Range   Vit D, 25-Hydroxy 38.1 30.0 - 100.0 ng/mL  Hepatitis C Antibody  Result Value Ref Range   Hep C Virus Ab Non Reactive Non Reactive  109604 11+Oxyco+Alc+Crt-Bund  Result Value Ref Range   Ethanol Negative Cutoff=0.020 %   Amphetamines, Urine Negative Cutoff=1000 ng/mL   Barbiturate Negative Cutoff=200 ng/mL   BENZODIAZ UR QL See Final Results Cutoff=200 ng/mL   Cannabinoid Quant, Ur See Final Results Cutoff=50 ng/mL   Cocaine (Metabolite) Negative Cutoff=300 ng/mL   OPIATE SCREEN URINE Negative Cutoff=300 ng/mL   Oxycodone/Oxymorphone, Urine Negative Cutoff=300 ng/mL   Phencyclidine Negative Cutoff=25 ng/mL   Methadone Screen, Urine Negative Cutoff=300 ng/mL   Propoxyphene Negative Cutoff=300 ng/mL   Meperidine Negative Cutoff=200 ng/mL   Tramadol Negative  Cutoff=200 ng/mL   Creatinine 25.8 20.0 - 300.0 mg/dL   pH, Urine 6.1 4.5 - 8.9  Benzodiazepines Confirm, Urine  Result Value Ref Range   Benzodiazepines Positive (A) Cutoff=200 ng/mL   Nordiazepam Positive (A)    Nordiazepam Conf 291 Cutoff=100 ng/mL   Oxazepam Positive (A)    Oxazepam Conf 400 Cutoff=100 ng/mL   Flurazepam Negative Cutoff=100   Lorazepam Negative Cutoff=100   Alprazolam Negative Cutoff=100   Clonazepam Negative Cutoff=100   Temazepam Positive (A)    Temazepam Conf. 366 Cutoff=100 ng/mL   Triazolam Negative Cutoff=100   Midazolam Negative Cutoff=100  Cannabinoid Conf, Ur  Result Value Ref Range   CANNABINOIDS Positive (A) Cutoff=50   Carboxy THC GC/MS Conf 66 Cutoff=15 ng/mL  Cytology - PAP  Result Value Ref Range   High risk HPV Negative    Adequacy Satisfactory for evaluation.    Diagnosis      - Negative for intraepithelial lesion or malignancy (NILM)   Microorganisms Shift in flora suggestive of bacterial vaginosis    Comment Normal Reference Range HPV - Negative  Assessment & Plan:   Problem List Items Addressed This Visit       Other   Anxiety    Under good control on current regimen. Continue current regimen. Continue to monitor. Call with any concerns. Refills given for 3 months. Follow up 3 months.       Relevant Medications   DULoxetine (CYMBALTA) 30 MG capsule   diazepam (VALIUM) 5 MG tablet   Depression, recurrent (HCC) - Primary    Under good control on current regimen. Continue current regimen. Continue to monitor. Call with any concerns. Refills given for 3 months. Follow up 3 months.        Relevant Medications   DULoxetine (CYMBALTA) 30 MG capsule   diazepam (VALIUM) 5 MG tablet   PTSD (post-traumatic stress disorder)    Under good control on current regimen. Continue current regimen. Continue to monitor. Call with any concerns. Refills given for 3 months. Follow up 3 months.       Relevant Medications   DULoxetine  (CYMBALTA) 30 MG capsule   diazepam (VALIUM) 5 MG tablet   Other Visit Diagnoses     Bilateral foot pain       Thinks her arches have fallen and would like to see podiatry. Referral generated today.   Relevant Orders   Ambulatory referral to Podiatry        Follow up plan: Return in about 3 months (around 08/12/2023).    This visit was completed via video visit through MyChart due to the restrictions of the COVID-19 pandemic. All issues as above were discussed and addressed. Physical exam was done as above through visual confirmation on video through MyChart. If it was felt that the patient should be evaluated in the office, they were directed there. The patient verbally consented to this visit. Location of the patient: home Location of the provider: work Those involved with this call:  Provider: Olevia Perches, DO CMA: Malen Gauze, CMA Front Desk/Registration:  Servando Snare   Time spent on call:  15 minutes with patient face to face via video conference. More than 50% of this time was spent in counseling and coordination of care. 23 minutes total spent in review of patient's record and preparation of their chart.

## 2023-05-12 NOTE — Progress Notes (Signed)
Attempted to reach patient to get her scheduled for 3 month follow up with provider.  LVM to call office back.  Put in CRM.

## 2023-06-05 ENCOUNTER — Other Ambulatory Visit: Payer: Self-pay

## 2023-06-05 ENCOUNTER — Emergency Department: Payer: Medicaid Other

## 2023-06-05 ENCOUNTER — Emergency Department
Admission: EM | Admit: 2023-06-05 | Discharge: 2023-06-05 | Disposition: A | Discharge status: Home / Self Care | Payer: Medicaid Other | Attending: Emergency Medicine | Admitting: Emergency Medicine

## 2023-06-05 DIAGNOSIS — W272XXA Contact with scissors, initial encounter: Secondary | ICD-10-CM | POA: Diagnosis not present

## 2023-06-05 DIAGNOSIS — Y93E8 Activity, other personal hygiene: Secondary | ICD-10-CM | POA: Diagnosis not present

## 2023-06-05 DIAGNOSIS — S61213D Laceration without foreign body of left middle finger without damage to nail, subsequent encounter: Secondary | ICD-10-CM

## 2023-06-05 DIAGNOSIS — S61213A Laceration without foreign body of left middle finger without damage to nail, initial encounter: Secondary | ICD-10-CM | POA: Insufficient documentation

## 2023-06-05 DIAGNOSIS — S6992XA Unspecified injury of left wrist, hand and finger(s), initial encounter: Secondary | ICD-10-CM | POA: Diagnosis not present

## 2023-06-05 MED ORDER — ACETAMINOPHEN 500 MG PO TABS
1000.0000 mg | ORAL_TABLET | Freq: Once | ORAL | Status: AC
  Administered 2023-06-05: 1000 mg via ORAL
  Filled 2023-06-05: qty 2

## 2023-06-05 MED ORDER — CEPHALEXIN 500 MG PO CAPS: 500.0000 mg | ORAL_CAPSULE | Freq: Two times a day (BID) | ORAL | 0 refills | Status: AC

## 2023-06-05 MED ORDER — LIDOCAINE HCL (PF) 1 % IJ SOLN
5.0000 mL | Freq: Once | INTRAMUSCULAR | Status: AC
  Administered 2023-06-05: 5 mL
  Filled 2023-06-05: qty 5

## 2023-06-05 NOTE — ED Triage Notes (Signed)
Pt here with a laceration. Pt was cutting hair and cut her middle finger on her left hand with shears. Pt has finger wrapped in gauze.

## 2023-06-05 NOTE — ED Provider Notes (Signed)
[facility] Emergency Department Provider Note     Event Date/Time   First MD Initiated Contact with Patient 06/05/23 1524     (approximate)   History   Laceration   HPI  Jessica Peters is a 42 y.o. female presents to the ED for complaint of laceration on her left middle finger.  Patient reports she was cutting hair with shears and sliced her finger over her knuckle.  Active bleeding..  Patient reports up-to-date on tetanus.  Denies numbness and tingling.     Physical Exam   Triage Vital Signs: ED Triage Vitals [06/05/23 1447]  Encounter Vitals Group     BP 126/89     Systolic BP Percentile      Diastolic BP Percentile      Pulse Rate 97     Resp 18     Temp 98.2 F (36.8 C)     Temp Source Oral     SpO2 99 %     Weight 156 lb 4.9 oz (70.9 kg)     Height 5\' 9"  (1.753 m)     Head Circumference      Peak Flow      Pain Score 10     Pain Loc      Pain Education      Exclude from Growth Chart     Most recent vital signs: Vitals:   06/05/23 1447  BP: 126/89  Pulse: 97  Resp: 18  Temp: 98.2 F (36.8 C)  SpO2: 99%    General Awake, no distress.  HEENT NCAT. PERRL. EOMI. No rhinorrhea.  CV:  Good peripheral perfusion.  RESP:  Normal effort.  ABD:  No distention.  Other:  2 cm ankle, vertical, linear, laceration over left middle PIP joint. Neurovascular status intact all throughout radian, ulnar and median nerve distribution.  AROM.  ED Results / Procedures / Treatments   Labs (all labs ordered are listed, but only abnormal results are displayed) Labs Reviewed - No data to display  RADIOLOGY  I personally viewed and evaluated these images as part of my medical decision making, as well as reviewing the written report by the radiologist.  ED Provider Interpretation: Unremarkable x-ray of middle left finger  DG Finger Middle Left  Result Date: 06/05/2023 CLINICAL DATA:  Trauma, laceration EXAM: LEFT MIDDLE FINGER 2+V  COMPARISON:  None Available. FINDINGS: No fracture or dislocation is seen. There are no opaque foreign bodies. IMPRESSION: No fracture or dislocation is seen in left middle finger. Electronically Signed   By: Ernie Avena M.D.   On: 06/05/2023 15:34    PROCEDURES:  Critical Care performed: No  ..Laceration Repair  Date/Time: 06/05/2023 4:33 PM  Performed by: Conrad [place], PA-C Authorized by: Conrad [place], PA-C   Consent:    Consent obtained:  Verbal   Consent given by:  Patient   Risks discussed:  Pain, infection, poor cosmetic result, vascular damage and nerve damage Anesthesia:    Anesthesia method:  Nerve block   Block location:  Middle digital nerve block   Block needle gauge:  27 G   Block anesthetic:  Lidocaine 1% w/o epi   Block injection procedure:  Anatomic landmarks identified and incremental injection   Block outcome:  Anesthesia achieved Laceration details:    Location:  Finger   Finger location:  L long finger   Length (cm):  2 Exploration:    Hemostasis achieved with:  Direct pressure   Imaging obtained:  x-ray     Imaging outcome: foreign body not noted     Wound exploration: entire depth of wound visualized     Contaminated: no   Treatment:    Area cleansed with:  Saline and povidone-iodine   Amount of cleaning:  Standard   Irrigation solution:  Sterile saline   Irrigation method:  Syringe   Visualized foreign bodies/material removed: no     Debridement:  None Skin repair:    Repair method:  Sutures   Suture size:  5-0   Suture technique:  Simple interrupted   Number of sutures:  3 Repair type:    Repair type:  Simple Post-procedure details:    Dressing:  Splint for protection and sterile dressing   Procedure completion:  Tolerated   MEDICATIONS ORDERED IN ED: Medications  lidocaine (PF) (XYLOCAINE) 1 % injection 5 mL (5 mLs Infiltration Given by Other 06/05/23 1640)  acetaminophen (TYLENOL) tablet 1,000 mg (1,000 mg Oral Given  06/05/23 1636)     IMPRESSION / MDM / ASSESSMENT AND PLAN / ED COURSE  I reviewed the triage vital signs and the nursing notes.                                42 y.o. female presents to the emergency department for evaluation and treatment of finger laceration to left middle finger over PIP joint. See HPI for further details.   Differential diagnosis includes, but is not limited to laceration, tendon injury, foreign body.  X-ray obtained in triage revealing no foreign body retention. Patient is up-to-date on her tetanus shot reporting 1 year ago.  No tetanus needed today. Access for laceration repair.  Patient will be provided a finger splint after a clean dressing.  Patient is to return or follow-up with her primary care for suture removal in 7 to 10 days.  Laceration care at home is provided.  Patient will be discharged home with prescriptions for Keflex.  ED precautions provided including development of a fever, red streaks around laceration site and worsening pain.  All questions and concerns were addressed during ED visit.    Patient's presentation is most consistent with acute complicated illness / injury requiring diagnostic workup.  FINAL CLINICAL IMPRESSION(S) / ED DIAGNOSES   Final diagnoses:  Laceration of left middle finger without foreign body without damage to nail, subsequent encounter     Rx / DC Orders   ED Discharge Orders          Ordered    cephALEXin (KEFLEX) 500 MG capsule  2 times daily        06/05/23 1629            Note:  This document was prepared using Dragon voice recognition software and may include unintentional dictation errors.    Romeo Apple, Tahjir Silveria A, PA-C 06/05/23 1700    Pilar Jarvis, MD 06/05/23 1944

## 2023-06-19 DIAGNOSIS — R202 Paresthesia of skin: Secondary | ICD-10-CM | POA: Diagnosis not present

## 2023-07-07 ENCOUNTER — Ambulatory Visit: Payer: Self-pay

## 2023-07-07 ENCOUNTER — Telehealth: Payer: Medicaid Other | Admitting: Physician Assistant

## 2023-07-07 DIAGNOSIS — L299 Pruritus, unspecified: Secondary | ICD-10-CM | POA: Diagnosis not present

## 2023-07-07 DIAGNOSIS — W57XXXA Bitten or stung by nonvenomous insect and other nonvenomous arthropods, initial encounter: Secondary | ICD-10-CM

## 2023-07-07 MED ORDER — PREDNISONE 10 MG PO TABS
ORAL_TABLET | ORAL | 0 refills | Status: DC
Start: 2023-07-07 — End: 2023-09-03

## 2023-07-07 NOTE — Patient Instructions (Signed)
Jessica Peters, thank you for joining Margaretann Loveless, PA-C for today's virtual visit.  While this provider is not your primary care provider (PCP), if your PCP is located in our provider database this encounter information will be shared with them immediately following your visit.   A [facility] MyChart account gives you access to today's visit and all your visits, tests, and labs performed at [facility] " click here if you don't have a [facility] MyChart account or go to [email or web address]  Consent: (Patient) Jessica Peters provided verbal consent for this virtual visit at the beginning of the encounter.  Current Medications:  Current Outpatient Medications:    predniSONE (DELTASONE) 10 MG tablet, Days 1-4 take 4 tablets (40 mg) daily  Days 5-8 take 3 tablets (30 mg) daily, Days 9-11 take 2 tablets (20 mg) daily, Days 12-14 take 1 tablet (10 mg) daily., Disp: 37 tablet, Rfl: 0   diazepam (VALIUM) 5 MG tablet, Take 1 tablet (5 mg total) by mouth every 8 (eight) hours as needed for anxiety or muscle spasms., Disp: 90 tablet, Rfl: 2   DULoxetine (CYMBALTA) 30 MG capsule, Take 3 capsules (90 mg total) by mouth daily., Disp: 270 capsule, Rfl: 1   gabapentin (NEURONTIN) 300 MG capsule, Take 300 mg by mouth 3 (three) times daily., Disp: , Rfl:    gabapentin (NEURONTIN) 600 MG tablet, Take 600 mg by mouth 3 (three) times daily., Disp: , Rfl:    lidocaine (XYLOCAINE) 5 % ointment, APPLY TO THE AFFECTED AREA AS NEEDED, Disp: 50 g, Rfl: 0   ondansetron (ZOFRAN-ODT) 4 MG disintegrating tablet, Take 4 mg by mouth 3 (three) times daily., Disp: , Rfl:    Medications ordered in this encounter:  Meds ordered this encounter  Medications   predniSONE (DELTASONE) 10 MG tablet    Sig: Days 1-4 take 4 tablets (40 mg) daily  Days 5-8 take 3 tablets (30 mg) daily, Days 9-11 take 2 tablets (20 mg) daily, Days 12-14 take 1 tablet (10 mg) daily.    Dispense:  37 tablet    Refill:  0     Order Specific Question:   Supervising Provider    Answer:   Merrilee Jansky [id number]     *If you need refills on other medications prior to your next appointment, please contact your pharmacy*  Follow-Up: Call back or seek an in-person evaluation if the symptoms worsen or if the condition fails to improve as anticipated.  [facility] [phone]  Other Instructions Insect Bite, Adult An insect bite can make your skin red, itchy, and swollen. An insect bite is different from an insect sting, which happens when an insect injects poison (venom) into the skin. Some insects can spread disease to people through a bite. However, most insect bites do not lead to disease and are not serious. What are the causes? Insects may bite for a variety of reasons, including: Hunger. To defend themselves. Insects that bite include: Spiders. Mosquitoes and flies. Ticks and fleas. Ants. Kissing bugs. Chiggers. What are the signs or symptoms? In many cases, symptoms last for 2-4 days. However, itching can last up to 10 days. Symptoms include: Itching or pain in the bite area. Redness and swelling in the bite area. An open wound (skin ulcer). In rare cases, a person may have a severe allergic reaction (anaphylactic reaction) to a bite. Symptoms of an anaphylactic reaction may include: Feeling warm in the face (flushed). This may include redness.  Itchy, red, swollen areas of skin (hives). Swelling of the eyes, lips, face, mouth, tongue, or throat. Wheezing or difficulty breathing, speaking, or swallowing. Dizziness, light-headedness, or fainting. Abdominal symptoms like cramping, nausea, vomiting, or diarrhea. How is this diagnosed? This condition is usually diagnosed based on symptoms and a physical exam. During the exam, your health care provider will look at the bite and ask you what kind of insect bit you. How is this treated? Most insect bites are not serious. Symptoms often  go away on their own and treatment is not usually needed. When treatment is recommended, it may include: Applying ice to the affected area. Applying steroid or other anti-itch creams, like calamine lotion, to the bite area. Medicines called antihistamines to reduce itching. You may also need: A tetanus shot if you are not up to date. Antibiotic cream or an oral antibiotic if the bite becomes infected (this is uncommon). Follow these instructions at home: Bite area care  Do not scratch the bite area. It may help to cover the bite area with a bandage or close-fitting clothing. Keep the bite area clean and dry. Wash it every day with soap and water as told by your health care provider. Check the bite area every day for signs of infection. Check for: More redness, swelling, or pain. Fluid or blood. Warmth. Pus or a bad smell. Managing pain, itching, and swelling  You may apply cortisone cream, calamine lotion, or a paste made of baking soda and water to the bite area as told by your health care provider. If directed, put ice on the bite area. To do this: Put ice in a plastic bag. Place a towel between your skin and the bag. Leave the ice on for 20 minutes, 2-3 times a day. If your skin turns bright red, remove the ice right away to prevent skin damage. The risk of skin damage is higher if you cannot feel pain, heat, or cold. General instructions Apply or take over-the-counter and prescription medicine only as told by your health care provider. If you were prescribed antibiotics, take or apply them as told by your health care provider. Do not stop using the antibiotic even if you start to feel better. How is this prevented? To help reduce your risk of insect bites: When you are outdoors, wear clothing that covers your arms and legs. This is especially important in the early morning and evening. Use insect repellent. The best insect repellents contain DEET, picaridin, oil of lemon eucalyptus  (OLE), or IR3535. Consider spraying your clothing with a pesticide called permethrin. Permethrin helps prevent insect bites. It works for several weeks and for up to 5-6 clothing washes. Do not apply permethrin directly to the skin. If your home windows do not have screens, consider installing them. If you will be sleeping in an area where there are mosquitoes, consider covering your sleeping area with a mosquito net. Contact a health care provider if: Your bite area has signs of infection, such as: More redness, swelling, or pain. Fluid or blood. Warmth. Pus or a bad smell. You have a fever. Get help right away if: You have a rash. You have muscle or joint pain. You feel unusually tired or weak. You have neck pain or a headache. You develop symptoms of an anaphylactic reaction. These may include: Swelling of the eyes, lips, face, mouth, tongue, or throat. Flushed skin or hives. Wheezing. Difficulty breathing, speaking, or swallowing. Dizziness, light-headedness, or fainting. Abdominal pain, cramping, vomiting, or diarrhea. These  symptoms may be an emergency. Get help right away. Call 911. Do not wait to see if the symptoms will go away. Do not drive yourself to the hospital. Summary An insect bite can make your skin red, itchy, and swollen. Treatment is usually not needed. Symptoms often go away on their own. When treatment is recommended, it may involve taking medicine, applying medicine to the area, or applying ice. Apply or take over-the-counter and prescription medicines only as told by your health care provider. Use insect repellent to help prevent insect bites. Contact a health care provider if your bite area has signs of infection. This information is not intended to replace advice given to you by your health care provider. Make sure you discuss any questions you have with your health care provider. Document Revised: 02/20/2022 Document Reviewed: 02/05/2022 Elsevier Patient  Education  2024 [employer]    If you have been instructed to have an in-person evaluation today at a local Urgent Care facility, please use the link below. It will take you to a list of all of our available [facility], including address, phone number and hours of operation. Please do not delay care.  [facility]  If you or a family member do not have a primary care provider, use the link below to schedule a visit and establish care. When you choose a [facility] primary care physician or advanced practice provider, you gain a long-term partner in health. Find a Primary Care Provider  Learn more about [facility] in-office and virtual care options:  - Get Care Now

## 2023-07-07 NOTE — Telephone Encounter (Signed)
  Chief Complaint: Insect bites Symptoms: itching Frequency: Saturday Pertinent Negatives: Patient denies  Disposition: [] ED /[] Urgent Care (no appt availability in office) / [] Appointment(In office/virtual)/ [x]  [facility]/ [] Home Care/ [] Refused Recommended Disposition /[] [facility]/ []  Follow-up with PCP Additional Notes: Pt was at the beach over the weekend and is covered in sand flea bites. Pt states she has tried benadryl and hydrocortisone w/o relief. No appts in office. Scheduled VV. Pt states that her insurance will only pay for rx's written by Dr. Laural Benes or her neurologist.    Reason for Disposition  [1] SEVERE local itching (i.e., interferes with work, school, sleep) AND [2] not improved after 24 hours of hydrocortisone cream  Answer Assessment - Initial Assessment Questions 1. TYPE of INSECT: "What type of insect was it?"      Sand fleas 2. ONSET: "When did you get bitten?"      Friday 3. LOCATION: "Where is the insect bite located?"      Everywhere 4. REDNESS: "Is the area red or pink?" If Yes, ask: "What size is area of redness?" (inches or cm). "When did the redness start?"     All over her body 5. PAIN: "Is there any pain?" If Yes, ask: "How bad is it?"  (Scale 1-10; or mild, moderate, severe)     no 6. ITCHING: "Does it itch?" If Yes, ask: "How bad is the itch?"    - MILD: doesn't interfere with normal activities   - MODERATE-SEVERE: interferes with work, school, sleep, or other activities      severe 7. SWELLING: "How big is the swelling?" (inches, cm, or compare to coins)     swelling 8. OTHER SYMPTOMS: "Do you have any other symptoms?"  (e.g., difficulty breathing, hives)     Swelling  Protocols used: Insect Bite-A-AH

## 2023-07-07 NOTE — Progress Notes (Signed)
Virtual Visit Consent   Jessica Peters, you are scheduled for a virtual visit with a [facility] provider today. Just as with appointments in the office, your consent must be obtained to participate. Your consent will be active for this visit and any virtual visit you may have with one of our providers in the next 365 days. If you have a MyChart account, a copy of this consent can be sent to you electronically.  As this is a virtual visit, video technology does not allow for your provider to perform a traditional examination. This may limit your provider's ability to fully assess your condition. If your provider identifies any concerns that need to be evaluated in person or the need to arrange testing (such as labs, EKG, etc.), we will make arrangements to do so. Although advances in technology are sophisticated, we cannot ensure that it will always work on either your end or our end. If the connection with a video visit is poor, the visit may have to be switched to a telephone visit. With either a video or telephone visit, we are not always able to ensure that we have a secure connection.  By engaging in this virtual visit, you consent to the provision of healthcare and authorize for your insurance to be billed (if applicable) for the services provided during this visit. Depending on your insurance coverage, you may receive a charge related to this service.  I need to obtain your verbal consent now. Are you willing to proceed with your visit today? SVANA WETTSTEIN has provided verbal consent on 07/07/2023 for a virtual visit (video or telephone). Margaretann Loveless, PA-C  Date: 07/07/2023 1:32 PM  Virtual Visit via Video Note   IMargaretann Loveless, connected with  Jessica Peters  ([id number], 29-Nov-1980) on 07/07/23 at  1:15 PM EDT by a video-enabled telemedicine application and verified that I am speaking with the correct person using two identifiers.  Location: Patient: Virtual Visit  Location Patient: Home Provider: Virtual Visit Location Provider: Home Office   I discussed the limitations of evaluation and management by telemedicine and the availability of in person appointments. The patient expressed understanding and agreed to proceed.    History of Present Illness: Jessica Peters is a 42 y.o. who identifies as a female who was assigned female at birth, and is being seen today for bug bites/itching. Went to the beach over the weekend and was bitten by sand fleas all over. She is having intense itching. She has been trying Benadryl and topical Hydrocortisone cream without relief.    Problems:  Patient Active Problem List   Diagnosis Date Noted   PTSD (post-traumatic stress disorder) 10/11/2021   Unstable cervical spine 09/01/2021   Acute traumatic injury of cervical spine (HCC) 09/01/2021   Status post laparoscopic hysterectomy 12/10/2018   Chronic pelvic pain in female 12/04/2018   Allergic rhinitis 10/03/2017   Depression, recurrent (HCC) 10/03/2017   Anxiety 06/13/2017   Vitamin D deficiency    Family history of breast cancer    Breast lump on right side at 1 o'clock position 02/03/2017    Allergies: No Known Allergies Medications:  Current Outpatient Medications:    predniSONE (DELTASONE) 10 MG tablet, Days 1-4 take 4 tablets (40 mg) daily  Days 5-8 take 3 tablets (30 mg) daily, Days 9-11 take 2 tablets (20 mg) daily, Days 12-14 take 1 tablet (10 mg) daily., Disp: 37 tablet, Rfl: 0   diazepam (VALIUM) 5 MG tablet, Take  1 tablet (5 mg total) by mouth every 8 (eight) hours as needed for anxiety or muscle spasms., Disp: 90 tablet, Rfl: 2   DULoxetine (CYMBALTA) 30 MG capsule, Take 3 capsules (90 mg total) by mouth daily., Disp: 270 capsule, Rfl: 1   gabapentin (NEURONTIN) 300 MG capsule, Take 300 mg by mouth 3 (three) times daily., Disp: , Rfl:    gabapentin (NEURONTIN) 600 MG tablet, Take 600 mg by mouth 3 (three) times daily., Disp: , Rfl:    lidocaine  (XYLOCAINE) 5 % ointment, APPLY TO THE AFFECTED AREA AS NEEDED, Disp: 50 g, Rfl: 0   ondansetron (ZOFRAN-ODT) 4 MG disintegrating tablet, Take 4 mg by mouth 3 (three) times daily., Disp: , Rfl:   Observations/Objective: Patient is well-developed, well-nourished in no acute distress.  Resting comfortably at home.  Head is normocephalic, atraumatic.  No labored breathing.  Speech is clear and coherent with logical content.  Patient is alert and oriented at baseline.  Bites noted diffusely all over legs bilaterally with clusters around ankles, some different stages of healing noted from excoriation wounds  Assessment and Plan: 1. Bug bite, initial encounter - predniSONE (DELTASONE) 10 MG tablet; Days 1-4 take 4 tablets (40 mg) daily  Days 5-8 take 3 tablets (30 mg) daily, Days 9-11 take 2 tablets (20 mg) daily, Days 12-14 take 1 tablet (10 mg) daily.  Dispense: 37 tablet; Refill: 0  2. Itching - predniSONE (DELTASONE) 10 MG tablet; Days 1-4 take 4 tablets (40 mg) daily  Days 5-8 take 3 tablets (30 mg) daily, Days 9-11 take 2 tablets (20 mg) daily, Days 12-14 take 1 tablet (10 mg) daily.  Dispense: 37 tablet; Refill: 0  - Bug bites diffusely located with recent beach trip indicating most likely sand flea bites with significant pruritus - Will prescribe Prednisone - May use topical Hydrocortisone cream, benadryl cream, and/or calamine lotion for itching - Cool compresses - Luke warm to cool showers - Seek in person evaluation if rash continues to spread or if any appear to become infected   Follow Up Instructions: I discussed the assessment and treatment plan with the patient. The patient was provided an opportunity to ask questions and all were answered. The patient agreed with the plan and demonstrated an understanding of the instructions.  A copy of instructions were sent to the patient via MyChart unless otherwise noted below.    The patient was advised to call back or seek an in-person  evaluation if the symptoms worsen or if the condition fails to improve as anticipated.  Time:  I spent 10 minutes with the patient via telehealth technology discussing the above problems/concerns.    Margaretann Loveless, PA-C

## 2023-07-25 ENCOUNTER — Ambulatory Visit: Payer: Medicaid Other | Admitting: Family Medicine

## 2023-07-25 ENCOUNTER — Encounter: Payer: Self-pay | Admitting: Family Medicine

## 2023-07-25 VITALS — BP 119/83 | HR 78 | Temp 98.4°F

## 2023-07-25 DIAGNOSIS — R21 Rash and other nonspecific skin eruption: Secondary | ICD-10-CM

## 2023-07-25 MED ORDER — SULFAMETHOXAZOLE-TRIMETHOPRIM 800-160 MG PO TABS: 1.0000 | ORAL_TABLET | Freq: Two times a day (BID) | ORAL | 0 refills | Status: DC

## 2023-07-25 MED ORDER — TRIAMCINOLONE ACETONIDE 0.1 % EX CREA: 1.0000 | TOPICAL_CREAM | Freq: Two times a day (BID) | CUTANEOUS | 0 refills | Status: DC

## 2023-07-25 NOTE — Progress Notes (Signed)
BP 119/83   Pulse 78   Temp 98.4 F (36.9 C) (Oral)   LMP 11/29/2015 (Exact Date)   SpO2 99%    Subjective:    Patient ID: Jessica Peters, female    DOB: October 14, 1981, 42 y.o.   MRN: [id number]  HPI: Jessica Peters is a 42 y.o. female  Chief Complaint  Patient presents with   Insect Bite   RASH Was on prednisone for 2 weeks helped with itchiness, started to clear and came back. Denies changes in soaps, detergents. She was on steroid taper for 10 days. Not sure if it is sand fleas, it happened while she was on the sand with sand fleas, spouse got two bites.  Duration:  20 days  Location: generalized  Itching: yes Burning: yes Redness: yes Oozing: no Scaling: yes Blisters: no Painful: no Fevers: no Change in detergents/soaps/personal care products: no Recent illness: no Recent travel:yes History of same: no Context: worse Alleviating factors: oatmeal, corn meal baths, [place], Eczema relief and hydrocortisone cream Treatments attempted: Shortness of breath: no  Throat/tongue swelling: no Myalgias/arthralgias: no   Relevant past medical, surgical, family and social history reviewed and updated as indicated. Interim medical history since our last visit reviewed. Allergies and medications reviewed and updated.  Review of Systems  Respiratory: Negative.    Cardiovascular: Negative.   Skin:  Positive for color change and rash.    Per HPI unless specifically indicated above     Objective:    BP 119/83   Pulse 78   Temp 98.4 F (36.9 C) (Oral)   LMP 11/29/2015 (Exact Date)   SpO2 99%   Wt Readings from Last 3 Encounters:  06/05/23 156 lb 4.9 oz (70.9 kg)  02/10/23 156 lb 3.2 oz (70.9 kg)  03/22/22 140 lb (63.5 kg)    Physical Exam Vitals and nursing note reviewed.  Constitutional:      General: She is awake. She is not in acute distress.    Appearance: Normal appearance. She is well-developed and well-groomed. She is not ill-appearing.  HENT:      Head: Normocephalic and atraumatic.     Right Ear: Hearing and external ear normal. No drainage.     Left Ear: Hearing and external ear normal. No drainage.     Nose: Nose normal.  Eyes:     General: Lids are normal.        Right eye: No discharge.        Left eye: No discharge.     Conjunctiva/sclera: Conjunctivae normal.  Cardiovascular:     Rate and Rhythm: Normal rate and regular rhythm.     Pulses:          Radial pulses are 2+ on the right side and 2+ on the left side.       Posterior tibial pulses are 2+ on the right side and 2+ on the left side.     Heart sounds: Normal heart sounds, S1 normal and S2 normal. No murmur heard.    No gallop.  Pulmonary:     Effort: Pulmonary effort is normal. No accessory muscle usage or respiratory distress.     Breath sounds: Normal breath sounds.  Musculoskeletal:        General: Normal range of motion.     Cervical back: Full passive range of motion without pain and normal range of motion.     Right lower leg: No edema.     Left lower leg: No edema.  Skin:  General: Skin is warm and dry.     Capillary Refill: Capillary refill takes less than 2 seconds.     Findings: Erythema and rash present. Rash is crusting and scaling.  Neurological:     Mental Status: She is alert and oriented to person, place, and time.  Psychiatric:        Attention and Perception: Attention normal.        Mood and Affect: Mood normal.        Speech: Speech normal.        Behavior: Behavior normal. Behavior is cooperative.        Thought Content: Thought content normal.     Results for orders placed or performed in visit on 02/10/23  Urine Culture   Specimen: Urine   UR  Result Value Ref Range   Urine Culture, Routine Final report (A)    Organism ID, Bacteria Escherichia coli (A)    Antimicrobial Susceptibility Comment   Microscopic Examination   Urine  Result Value Ref Range   WBC, UA 0-5 0 - 5 /hpf   RBC, Urine 0-2 0 - 2 /hpf   Epithelial Cells  (non renal) >10E 0 - 10 /hpf   Bacteria, UA Moderate (A) None seen/Few  CBC with Differential/Platelet  Result Value Ref Range   WBC 6.0 3.4 - 10.8 x10E3/uL   RBC 4.36 3.77 - 5.28 x10E6/uL   Hemoglobin 13.4 11.1 - 15.9 g/dL   Hematocrit 16.1 09.6 - 46.6 %   MCV 92 79 - 97 fL   MCH 30.7 26.6 - 33.0 pg   MCHC 33.3 31.5 - 35.7 g/dL   RDW 04.5 40.9 - 81.1 %   Platelets 126 (L) 150 - 450 x10E3/uL   Neutrophils 51 Not Estab. %   Lymphs 38 Not Estab. %   Monocytes 6 Not Estab. %   Eos 4 Not Estab. %   Basos 1 Not Estab. %   Neutrophils Absolute 3.1 1.4 - 7.0 x10E3/uL   Lymphocytes Absolute 2.3 0.7 - 3.1 x10E3/uL   Monocytes Absolute 0.4 0.1 - 0.9 x10E3/uL   EOS (ABSOLUTE) 0.2 0.0 - 0.4 x10E3/uL   Basophils Absolute 0.0 0.0 - 0.2 x10E3/uL   Immature Granulocytes 0 Not Estab. %   Immature Grans (Abs) 0.0 0.0 - 0.1 x10E3/uL  Comprehensive metabolic panel  Result Value Ref Range   Glucose 79 70 - 99 mg/dL   BUN 14 6 - 24 mg/dL   Creatinine, Ser 9.14 0.57 - 1.00 mg/dL   eGFR 782 >95 AO/ZHY/8.65   BUN/Creatinine Ratio 19 9 - 23   Sodium 139 134 - 144 mmol/L   Potassium 3.8 3.5 - 5.2 mmol/L   Chloride 103 96 - 106 mmol/L   CO2 24 20 - 29 mmol/L   Calcium 9.4 8.7 - 10.2 mg/dL   Total Protein 7.0 6.0 - 8.5 g/dL   Albumin 4.8 3.9 - 4.9 g/dL   Globulin, Total 2.2 1.5 - 4.5 g/dL   Albumin/Globulin Ratio 2.2 1.2 - 2.2   Bilirubin Total 0.4 0.0 - 1.2 mg/dL   Alkaline Phosphatase 37 (L) 44 - 121 IU/L   AST 25 0 - 40 IU/L   ALT 17 0 - 32 IU/L  Lipid Panel w/o Chol/HDL Ratio  Result Value Ref Range   Cholesterol, Total 154 100 - 199 mg/dL   Triglycerides 53 0 - 149 mg/dL   HDL 82 >78 mg/dL   VLDL Cholesterol Cal 11 5 - 40 mg/dL   LDL Chol Calc (  NIH) 61 0 - 99 mg/dL  Urinalysis, Routine w reflex microscopic  Result Value Ref Range   Specific Gravity, UA 1.015 1.005 - 1.030   pH, UA 6.5 5.0 - 7.5   Color, UA Yellow Yellow   Appearance Ur Cloudy (A) Clear   Leukocytes,UA Trace (A)  Negative   Protein,UA Negative Negative/Trace   Glucose, UA Negative Negative   Ketones, UA Negative Negative   RBC, UA Negative Negative   Bilirubin, UA Negative Negative   Urobilinogen, Ur 0.2 0.2 - 1.0 mg/dL   Nitrite, UA Positive (A) Negative   Microscopic Examination See below:   TSH  Result Value Ref Range   TSH 0.805 0.450 - 4.500 uIU/mL  VITAMIN D 25 Hydroxy (Vit-D Deficiency, Fractures)  Result Value Ref Range   Vit D, 25-Hydroxy 38.1 30.0 - 100.0 ng/mL  Hepatitis C Antibody  Result Value Ref Range   Hep C Virus Ab Non Reactive Non Reactive  [id number] 11+Oxyco+Alc+Crt-Bund  Result Value Ref Range   Ethanol Negative Cutoff=0.020 %   Amphetamines, Urine Negative Cutoff=1000 ng/mL   Barbiturate Negative Cutoff=200 ng/mL   BENZODIAZ UR QL See Final Results Cutoff=200 ng/mL   Cannabinoid Quant, Ur See Final Results Cutoff=50 ng/mL   Cocaine (Metabolite) Negative Cutoff=300 ng/mL   OPIATE SCREEN URINE Negative Cutoff=300 ng/mL   Oxycodone/Oxymorphone, Urine Negative Cutoff=300 ng/mL   Phencyclidine Negative Cutoff=25 ng/mL   Methadone Screen, Urine Negative Cutoff=300 ng/mL   Propoxyphene Negative Cutoff=300 ng/mL   Meperidine Negative Cutoff=200 ng/mL   Tramadol Negative Cutoff=200 ng/mL   Creatinine 25.8 20.0 - 300.0 mg/dL   pH, Urine 6.1 4.5 - 8.9  Benzodiazepines Confirm, Urine  Result Value Ref Range   Benzodiazepines Positive (A) Cutoff=200 ng/mL   Nordiazepam Positive (A)    Nordiazepam Conf 291 Cutoff=100 ng/mL   Oxazepam Positive (A)    Oxazepam Conf 400 Cutoff=100 ng/mL   Flurazepam Negative Cutoff=100   Lorazepam Negative Cutoff=100   Alprazolam Negative Cutoff=100   Clonazepam Negative Cutoff=100   Temazepam Positive (A)    Temazepam Conf. 366 Cutoff=100 ng/mL   Triazolam Negative Cutoff=100   Midazolam Negative Cutoff=100  Cannabinoid Conf, Ur  Result Value Ref Range   CANNABINOIDS Positive (A) Cutoff=50   Carboxy THC GC/MS Conf 66 Cutoff=15 ng/mL   Cytology - PAP  Result Value Ref Range   High risk HPV Negative    Adequacy Satisfactory for evaluation.    Diagnosis      - Negative for intraepithelial lesion or malignancy (NILM)   Microorganisms Shift in flora suggestive of bacterial vaginosis    Comment Normal Reference Range HPV - Negative       Assessment & Plan:   Problem List Items Addressed This Visit   None Visit Diagnoses     Excoriated rash    -  Primary   Acute, worsening. Bactrim BID for 10 days, recommend Triamcinolone 1% ointment for relief. Recommend antihistamine for itching. Call sooner if concerns arise.        Follow up plan: Return in about 10 days (around 08/04/2023) for Follow up mood.

## 2023-08-14 ENCOUNTER — Encounter: Payer: Self-pay | Admitting: Family Medicine

## 2023-08-18 DIAGNOSIS — S13171D Dislocation of C6/C7 cervical vertebrae, subsequent encounter: Secondary | ICD-10-CM | POA: Diagnosis not present

## 2023-08-18 DIAGNOSIS — M542 Cervicalgia: Secondary | ICD-10-CM | POA: Diagnosis not present

## 2023-08-19 ENCOUNTER — Ambulatory Visit: Payer: Medicaid Other | Admitting: Podiatry

## 2023-08-19 DIAGNOSIS — K122 Cellulitis and abscess of mouth: Secondary | ICD-10-CM | POA: Diagnosis not present

## 2023-08-19 DIAGNOSIS — M272 Inflammatory conditions of jaws: Secondary | ICD-10-CM | POA: Diagnosis not present

## 2023-08-26 ENCOUNTER — Ambulatory Visit: Payer: Medicaid Other | Admitting: Podiatry

## 2023-08-26 DIAGNOSIS — M47812 Spondylosis without myelopathy or radiculopathy, cervical region: Secondary | ICD-10-CM | POA: Diagnosis not present

## 2023-08-29 ENCOUNTER — Ambulatory Visit: Payer: Medicaid Other | Admitting: Podiatry

## 2023-09-02 ENCOUNTER — Encounter: Payer: Self-pay | Admitting: Podiatry

## 2023-09-02 ENCOUNTER — Ambulatory Visit (INDEPENDENT_AMBULATORY_CARE_PROVIDER_SITE_OTHER): Payer: Medicaid Other

## 2023-09-02 ENCOUNTER — Telehealth: Payer: Self-pay | Admitting: Family Medicine

## 2023-09-02 ENCOUNTER — Ambulatory Visit: Payer: Medicaid Other | Admitting: Podiatry

## 2023-09-02 VITALS — BP 105/62 | HR 67

## 2023-09-02 DIAGNOSIS — M2142 Flat foot [pes planus] (acquired), left foot: Secondary | ICD-10-CM | POA: Diagnosis not present

## 2023-09-02 DIAGNOSIS — B351 Tinea unguium: Secondary | ICD-10-CM

## 2023-09-02 DIAGNOSIS — M722 Plantar fascial fibromatosis: Secondary | ICD-10-CM | POA: Diagnosis not present

## 2023-09-02 DIAGNOSIS — B353 Tinea pedis: Secondary | ICD-10-CM | POA: Diagnosis not present

## 2023-09-02 DIAGNOSIS — M2141 Flat foot [pes planus] (acquired), right foot: Secondary | ICD-10-CM

## 2023-09-02 MED ORDER — CLOTRIMAZOLE-BETAMETHASONE 1-0.05 % EX CREA: 1.0000 | TOPICAL_CREAM | Freq: Every day | CUTANEOUS | 1 refills | Status: DC

## 2023-09-02 MED ORDER — TERBINAFINE HCL 250 MG PO TABS: 250.0000 mg | ORAL_TABLET | Freq: Every day | ORAL | 0 refills | Status: DC

## 2023-09-02 NOTE — Progress Notes (Signed)
Chief Complaint  Patient presents with   Foot Pain    "I have pain in my arches.  I feel like they have fallen." N - arch pain  L - bilateral D - 5 years O - suddenly, gotten worse C - sharp pain A - standing long periods of time T - none   Nail Problem    "My toenails are bad.  I have a rash." N - bad toenails L - 1-5 bilateral D - 5 years O - gradually worse C - thick, brittle, discolored, painful sometime A - none T - Lamisil about 2-3 years ago  N - rash L - ankle bilateral D - 2 mos O - suddenly, sand fleas C - itchy, bleeding from scratching A - scratching T - primary care gave me 3 wk taper Prednisone, Triamcinolone cream    Subjective: 42 y.o. female presenting today as a new patient for evaluation of thick dystrophic nails noted bilateral.  Onset for a few years now.  Concern for possible toenail fungus.  She has tried multiple different topical antifungal medications with no improvement.  She is also tried at home remedies such as Vicks vapor rub and apple cider vinegar soaks. She is also experiencing some itching to the skin of the bilateral feet.  Which has been ongoing for a few weeks now. Finally the patient states that when she stands for long periods of time she develops arch pain.  Past Medical History:  Diagnosis Date   Allergy    Anemia    Anxiety    BRCA negative 02/2018   MyRisk neg   Endometriosis    Family history of breast cancer 02/2018   My Risk neg   Headache    h/o migraines   Increased risk of breast cancer 02/2018   IBIS=18.8%/riskscore=26.3%   Ovarian cyst    Ovarian cyst    PTSD (post-traumatic stress disorder)    PTSD (post-traumatic stress disorder)    Thrombocytopenia (HCC)    Vitamin D deficiency     Past Surgical History:  Procedure Laterality Date   ANTERIOR CERVICAL DECOMP/DISCECTOMY FUSION N/A 09/01/2021   Procedure: ANTERIOR CERVICAL DECOMPRESSION/DISCECTOMY FUSION Cervical six-Cervical seven, Open Treatment and  REDUCTION OF FRACTURE;  Surgeon: Bethann Goo, DO;  Location: [facility] OR;  Service: Neurosurgery;  Laterality: N/A;   CYSTOSCOPY Bilateral 12/10/2018   Procedure: CYSTOSCOPY;  Surgeon: Conard Novak, MD;  Location: [facility];  Service: Gynecology;  Laterality: Bilateral;   DILITATION & CURRETTAGE/HYSTROSCOPY WITH NOVASURE ABLATION  12/07/2015   Procedure: DILATATION & CURETTAGE/HYSTEROSCOPY WITH NOVASURE ABLATION;  Surgeon: Conard Novak, MD;  Location: [facility] OR[facility];  Service: Gynecology;;   LAPAROSCOPIC HYSTERECTOMY Bilateral 12/10/2018   Procedure: HYSTERECTOMY TOTAL LAPAROSCOPIC BILATERAL SALPRINGETOMY;  Surgeon: Conard Novak, MD;  Location: [facility] ORS;  Service: Gynecology;  Laterality: Bilateral;   MOUTH SURGERY     TONSILLECTOMY     TUBAL LIGATION      No Known Allergies   B/L toes 09/02/2023  Objective: Physical Exam General: The patient is alert and oriented x3 in no acute distress.  Dermatology: Hyperkeratotic, discolored, thickened, onychodystrophy noted. Skin is warm, dry and supple bilateral lower extremities. Negative for open lesions or macerations. There is also some noted pruritus and dermatitis around the plantar weightbearing surface of the feet extending up into the back of the heel.  Suspicion for possible tinea pedis  Vascular: Palpable pedal pulses bilaterally. No edema or erythema noted. Capillary refill within normal limits.  Neurological:  Grossly intact via light touch  Musculoskeletal Exam: No pedal deformity noted  Assessment: #1 Onychomycosis of toenails #2 tinea pedis bilateral #3 arch pain bilateral  Plan of Care:  -Patient was evaluated. -Today we discussed different treatment options including oral, topical, and laser antifungal treatment modalities.  We discussed their efficacies and side effects.  Patient opts for oral antifungal treatment modality -Prescription for Lamisil 250 mg #90 daily. Pt denies a history of liver pathology or symptoms.    -Prescription for Lotrisone cream apply twice daily -Recommend OTC arch supports to support the medial longitudinal arch of the foot and alleviate her mid arch pain especially during periods of prolonged standing -Return to clinic 6 months   Felecia Shelling, DPM [facility]  Dr. Felecia Shelling, DPM    [address] [place], [place] [address]                Office [phone]  Fax [phone]

## 2023-09-02 NOTE — Patient Instructions (Signed)
[employer]  All available on [employer]

## 2023-09-03 ENCOUNTER — Ambulatory Visit
Admission: RE | Admit: 2023-09-03 | Discharge: 2023-09-03 | Disposition: A | Discharge status: Home / Self Care | Payer: Medicaid Other | Source: Ambulatory Visit | Attending: Family Medicine | Admitting: Family Medicine

## 2023-09-03 ENCOUNTER — Encounter: Payer: Self-pay | Admitting: Family Medicine

## 2023-09-03 ENCOUNTER — Ambulatory Visit: Payer: Medicaid Other | Admitting: Family Medicine

## 2023-09-03 VITALS — BP 137/81 | HR 74 | Ht 69.0 in | Wt 156.6 lb

## 2023-09-03 DIAGNOSIS — R159 Full incontinence of feces: Secondary | ICD-10-CM

## 2023-09-03 DIAGNOSIS — N3944 Nocturnal enuresis: Secondary | ICD-10-CM

## 2023-09-03 DIAGNOSIS — F339 Major depressive disorder, recurrent, unspecified: Secondary | ICD-10-CM

## 2023-09-03 DIAGNOSIS — Z23 Encounter for immunization: Secondary | ICD-10-CM | POA: Diagnosis not present

## 2023-09-03 DIAGNOSIS — S14109A Unspecified injury at unspecified level of cervical spinal cord, initial encounter: Secondary | ICD-10-CM

## 2023-09-03 DIAGNOSIS — M5127 Other intervertebral disc displacement, lumbosacral region: Secondary | ICD-10-CM | POA: Diagnosis not present

## 2023-09-03 DIAGNOSIS — F419 Anxiety disorder, unspecified: Secondary | ICD-10-CM | POA: Diagnosis not present

## 2023-09-03 LAB — URINALYSIS, ROUTINE W REFLEX MICROSCOPIC
Bilirubin, UA: NEGATIVE
Glucose, UA: NEGATIVE
Ketones, UA: NEGATIVE
Leukocytes,UA: NEGATIVE
Nitrite, UA: NEGATIVE
Protein,UA: NEGATIVE
RBC, UA: NEGATIVE
Specific Gravity, UA: 1.01 (ref 1.005–1.030)
Urobilinogen, Ur: 0.2 mg/dL (ref 0.2–1.0)
pH, UA: 6 (ref 5.0–7.5)

## 2023-09-03 MED ORDER — GABAPENTIN 600 MG PO TABS: 600.0000 mg | ORAL_TABLET | Freq: Three times a day (TID) | ORAL | 3 refills | Status: DC

## 2023-09-03 MED ORDER — DIAZEPAM 5 MG PO TABS: 5.0000 mg | ORAL_TABLET | Freq: Three times a day (TID) | ORAL | 2 refills | Status: DC | PRN

## 2023-09-03 MED ORDER — BUPROPION HCL ER (SR) 150 MG PO TB12: 150.0000 mg | ORAL_TABLET | Freq: Two times a day (BID) | ORAL | 1 refills | Status: DC

## 2023-09-03 MED ORDER — GABAPENTIN 300 MG PO CAPS: 300.0000 mg | ORAL_CAPSULE | Freq: Three times a day (TID) | ORAL | 3 refills | Status: DC

## 2023-09-03 NOTE — Assessment & Plan Note (Signed)
Has been following with neurosurgery for ?radiofrequency ablation. Due to see them next in about a week. Has been unable to get her gabapentin due to issues with insurance. Refill given today. Refill of her valium given today. Follow up with them as scheduled. Call with any concerns.

## 2023-09-03 NOTE — Telephone Encounter (Signed)
error 

## 2023-09-03 NOTE — Progress Notes (Signed)
BP 137/81   Pulse 74   Ht 5\' 9"  (1.753 m)   Wt 156 lb 9.6 oz (71 kg)   LMP 11/29/2015 (Exact Date)   SpO2 100%   BMI 23.13 kg/m    Subjective:    Patient ID: Jessica Peters, female    DOB: 07/22/1981, 42 y.o.   MRN: [id number]  HPI: Jessica Peters is a 42 y.o. female  Chief Complaint  Patient presents with   Mood   Referral    Patient would like to discuss having a new referral to a new therapist. Patient says she has not been able to get her medications and was denied her disability due to therapist.    Medication Refill    Patient says she is requesting a refill on her Gabapentin 300 mg and 600 mg. Patient says she only has two providers to be able to prescribed and refill her medications, which is Dr Laural Benes and one of her surgeon, which patient says she has 3-4 surgeons.    [name] presents today for follow up. She notes that she has not been doing well. She feels like her arms have gotten a lot worse. They have been feeling heavy and weaker. She notes that she has been having pain that radiates down them. She notes that she had a "pain management shot" in her neck and spinal cord on 08/26/23. This was to see about doing an ablation on the nerves to help with her pain. She felt like it helped a bit at first. She is anxious that it may have done something though as she notes that she lost control of both her bowel and bladder last night. She did not have an epidural injection and it was over a week ago. She has been seeing [facility]. She has not reached out to them yet. She does note that her back has been hurting and she has been feeling heavy in her legs. This did happen while she was asleep. She does note that she has had loss of control of her bladder without warning previously, but never her bowels. She does not know if she had any warning of it as she was asleep.   She does not want to go back to see her her psychiatrist as she states that the information  she has in her notes was incorrect. She thinks this lead to issues that she is having with her insurance. She is currently being investigated for medicaid fraud. Her food stamps are likely to be taken away. She is working with a Clinical research associate, but is very concerned. Her son has had to stop regular school to help with bills and her daughter has been having issues attending school. She is very anxious about this and notes that her mood has gotten signficantly worse. She has considered going to the hospital several times not because of suicidal ideation, but because she feels completely overwhelmed and hopeless.  DEPRESSION Duration: chronic, worsened Mood status: exacerbated Satisfied with current treatment?: no Symptom severity: severe  Duration of current treatment : chronic Side effects: no Medication compliance: excellent compliance Psychotherapy/counseling: yes in the past Previous psychiatric medications: cymbalta Depressed mood: yes Anxious mood: yes Anhedonia: yes Significant weight loss or gain: no Insomnia: no  Fatigue: yes Feelings of worthlessness or guilt: yes Impaired concentration/indecisiveness: yes Suicidal ideations: no Hopelessness: yes Crying spells: yes    09/03/2023   11:17 AM 07/25/2023    2:52 PM 05/12/2023    8:27 AM  12/05/2022   11:23 AM 09/27/2022   11:30 AM  Depression screen PHQ 2/9  Decreased Interest 3 1 0 3 3  Down, Depressed, Hopeless 3 1 0 2 3  PHQ - 2 Score 6 2 0 5 6  Altered sleeping 3 3 3 3 3   Tired, decreased energy 3 1 0 3 3  Change in appetite 3 1 3 3 3   Feeling bad or failure about yourself  3 1 0 3 3  Trouble concentrating 3 3 0 3 3  Moving slowly or fidgety/restless 2 0 0 3 3  Suicidal thoughts 0 0 0 0 0  PHQ-9 Score 23 11 6 23 24   Difficult doing work/chores Extremely dIfficult Somewhat difficult Not difficult at all Very difficult Extremely dIfficult     Relevant past medical, surgical, family and social history reviewed and updated as  indicated. Interim medical history since our last visit reviewed. Allergies and medications reviewed and updated.  Review of Systems  Constitutional: Negative.   Respiratory: Negative.    Cardiovascular: Negative.   Genitourinary: Negative.   Musculoskeletal:  Positive for back pain, myalgias, neck pain and neck stiffness. Negative for arthralgias, gait problem and joint swelling.  Skin: Negative.   Neurological:  Positive for weakness and numbness. Negative for dizziness, tremors, seizures, syncope, facial asymmetry, speech difficulty, light-headedness and headaches.  Hematological: Negative.   Psychiatric/Behavioral:  Positive for dysphoric mood and sleep disturbance. Negative for agitation, behavioral problems, confusion, decreased concentration, hallucinations, self-injury and suicidal ideas. The patient is nervous/anxious. The patient is not hyperactive.     Per HPI unless specifically indicated above     Objective:    BP 137/81   Pulse 74   Ht 5\' 9"  (1.753 m)   Wt 156 lb 9.6 oz (71 kg)   LMP 11/29/2015 (Exact Date)   SpO2 100%   BMI 23.13 kg/m   Wt Readings from Last 3 Encounters:  09/03/23 156 lb 9.6 oz (71 kg)  06/05/23 156 lb 4.9 oz (70.9 kg)  02/10/23 156 lb 3.2 oz (70.9 kg)    Physical Exam Vitals and nursing note reviewed.  Constitutional:      General: She is not in acute distress.    Appearance: Normal appearance. She is not ill-appearing, toxic-appearing or diaphoretic.  HENT:     Head: Normocephalic and atraumatic.     Right Ear: External ear normal.     Left Ear: External ear normal.     Nose: Nose normal.     Mouth/Throat:     Mouth: Mucous membranes are moist.     Pharynx: Oropharynx is clear.  Eyes:     General: No scleral icterus.       Right eye: No discharge.        Left eye: No discharge.     Extraocular Movements: Extraocular movements intact.     Conjunctiva/sclera: Conjunctivae normal.     Pupils: Pupils are equal, round, and reactive to  light.  Cardiovascular:     Rate and Rhythm: Normal rate and regular rhythm.     Pulses: Normal pulses.     Heart sounds: Normal heart sounds. No murmur heard.    No friction rub. No gallop.  Pulmonary:     Effort: Pulmonary effort is normal. No respiratory distress.     Breath sounds: Normal breath sounds. No stridor. No wheezing, rhonchi or rales.  Chest:     Chest wall: No tenderness.  Musculoskeletal:        General: Normal  range of motion.     Cervical back: Normal range of motion and neck supple.  Skin:    General: Skin is warm and dry.     Capillary Refill: Capillary refill takes less than 2 seconds.     Coloration: Skin is not jaundiced or pale.     Findings: No bruising, erythema, lesion or rash.  Neurological:     General: No focal deficit present.     Mental Status: She is alert and oriented to person, place, and time. Mental status is at baseline.  Psychiatric:        Mood and Affect: Mood normal. Affect is tearful.        Behavior: Behavior normal.        Thought Content: Thought content normal.        Judgment: Judgment normal.     Results for orders placed or performed in visit on 02/10/23  Urine Culture   Specimen: Urine   UR  Result Value Ref Range   Urine Culture, Routine Final report (A)    Organism ID, Bacteria Escherichia coli (A)    Antimicrobial Susceptibility Comment   Microscopic Examination   Urine  Result Value Ref Range   WBC, UA 0-5 0 - 5 /hpf   RBC, Urine 0-2 0 - 2 /hpf   Epithelial Cells (non renal) >10E 0 - 10 /hpf   Bacteria, UA Moderate (A) None seen/Few  CBC with Differential/Platelet  Result Value Ref Range   WBC 6.0 3.4 - 10.8 x10E3/uL   RBC 4.36 3.77 - 5.28 x10E6/uL   Hemoglobin 13.4 11.1 - 15.9 g/dL   Hematocrit 40.9 81.1 - 46.6 %   MCV 92 79 - 97 fL   MCH 30.7 26.6 - 33.0 pg   MCHC 33.3 31.5 - 35.7 g/dL   RDW 91.4 78.2 - 95.6 %   Platelets 126 (L) 150 - 450 x10E3/uL   Neutrophils 51 Not Estab. %   Lymphs 38 Not Estab. %    Monocytes 6 Not Estab. %   Eos 4 Not Estab. %   Basos 1 Not Estab. %   Neutrophils Absolute 3.1 1.4 - 7.0 x10E3/uL   Lymphocytes Absolute 2.3 0.7 - 3.1 x10E3/uL   Monocytes Absolute 0.4 0.1 - 0.9 x10E3/uL   EOS (ABSOLUTE) 0.2 0.0 - 0.4 x10E3/uL   Basophils Absolute 0.0 0.0 - 0.2 x10E3/uL   Immature Granulocytes 0 Not Estab. %   Immature Grans (Abs) 0.0 0.0 - 0.1 x10E3/uL  Comprehensive metabolic panel  Result Value Ref Range   Glucose 79 70 - 99 mg/dL   BUN 14 6 - 24 mg/dL   Creatinine, Ser 2.13 0.57 - 1.00 mg/dL   eGFR 086 >57 QI/ONG/2.95   BUN/Creatinine Ratio 19 9 - 23   Sodium 139 134 - 144 mmol/L   Potassium 3.8 3.5 - 5.2 mmol/L   Chloride 103 96 - 106 mmol/L   CO2 24 20 - 29 mmol/L   Calcium 9.4 8.7 - 10.2 mg/dL   Total Protein 7.0 6.0 - 8.5 g/dL   Albumin 4.8 3.9 - 4.9 g/dL   Globulin, Total 2.2 1.5 - 4.5 g/dL   Albumin/Globulin Ratio 2.2 1.2 - 2.2   Bilirubin Total 0.4 0.0 - 1.2 mg/dL   Alkaline Phosphatase 37 (L) 44 - 121 IU/L   AST 25 0 - 40 IU/L   ALT 17 0 - 32 IU/L  Lipid Panel w/o Chol/HDL Ratio  Result Value Ref Range   Cholesterol, Total 154 100 -  199 mg/dL   Triglycerides 53 0 - 149 mg/dL   HDL 82 >27 mg/dL   VLDL Cholesterol Cal 11 5 - 40 mg/dL   LDL Chol Calc (NIH) 61 0 - 99 mg/dL  Urinalysis, Routine w reflex microscopic  Result Value Ref Range   Specific Gravity, UA 1.015 1.005 - 1.030   pH, UA 6.5 5.0 - 7.5   Color, UA Yellow Yellow   Appearance Ur Cloudy (A) Clear   Leukocytes,UA Trace (A) Negative   Protein,UA Negative Negative/Trace   Glucose, UA Negative Negative   Ketones, UA Negative Negative   RBC, UA Negative Negative   Bilirubin, UA Negative Negative   Urobilinogen, Ur 0.2 0.2 - 1.0 mg/dL   Nitrite, UA Positive (A) Negative   Microscopic Examination See below:   TSH  Result Value Ref Range   TSH 0.805 0.450 - 4.500 uIU/mL  VITAMIN D 25 Hydroxy (Vit-D Deficiency, Fractures)  Result Value Ref Range   Vit D, 25-Hydroxy 38.1 30.0 -  100.0 ng/mL  Hepatitis C Antibody  Result Value Ref Range   Hep C Virus Ab Non Reactive Non Reactive  253664 11+Oxyco+Alc+Crt-Bund  Result Value Ref Range   Ethanol Negative Cutoff=0.020 %   Amphetamines, Urine Negative Cutoff=1000 ng/mL   Barbiturate Negative Cutoff=200 ng/mL   BENZODIAZ UR QL See Final Results Cutoff=200 ng/mL   Cannabinoid Quant, Ur See Final Results Cutoff=50 ng/mL   Cocaine (Metabolite) Negative Cutoff=300 ng/mL   OPIATE SCREEN URINE Negative Cutoff=300 ng/mL   Oxycodone/Oxymorphone, Urine Negative Cutoff=300 ng/mL   Phencyclidine Negative Cutoff=25 ng/mL   Methadone Screen, Urine Negative Cutoff=300 ng/mL   Propoxyphene Negative Cutoff=300 ng/mL   Meperidine Negative Cutoff=200 ng/mL   Tramadol Negative Cutoff=200 ng/mL   Creatinine 25.8 20.0 - 300.0 mg/dL   pH, Urine 6.1 4.5 - 8.9  Benzodiazepines Confirm, Urine  Result Value Ref Range   Benzodiazepines Positive (A) Cutoff=200 ng/mL   Nordiazepam Positive (A)    Nordiazepam Conf 291 Cutoff=100 ng/mL   Oxazepam Positive (A)    Oxazepam Conf 400 Cutoff=100 ng/mL   Flurazepam Negative Cutoff=100   Lorazepam Negative Cutoff=100   Alprazolam Negative Cutoff=100   Clonazepam Negative Cutoff=100   Temazepam Positive (A)    Temazepam Conf. 366 Cutoff=100 ng/mL   Triazolam Negative Cutoff=100   Midazolam Negative Cutoff=100  Cannabinoid Conf, Ur  Result Value Ref Range   CANNABINOIDS Positive (A) Cutoff=50   Carboxy THC GC/MS Conf 66 Cutoff=15 ng/mL  Cytology - PAP  Result Value Ref Range   High risk HPV Negative    Adequacy Satisfactory for evaluation.    Diagnosis      - Negative for intraepithelial lesion or malignancy (NILM)   Microorganisms Shift in flora suggestive of bacterial vaginosis    Comment Normal Reference Range HPV - Negative       Assessment & Plan:   Problem List Items Addressed This Visit       Musculoskeletal and Integument   Acute traumatic injury of cervical spine (HCC)     Has been following with neurosurgery for ?radiofrequency ablation. Due to see them next in about a week. Has been unable to get her gabapentin due to issues with insurance. Refill given today. Refill of her valium given today. Follow up with them as scheduled. Call with any concerns.       Relevant Orders   AMB Referral to Managed Medicaid Care Management     Other   Anxiety    See discussion under depression.  Relevant Medications   diazepam (VALIUM) 5 MG tablet   buPROPion (WELLBUTRIN SR) 150 MG 12 hr tablet   Other Relevant Orders   Ambulatory referral to Psychiatry   AMB Referral to Managed Medicaid Care Management   Depression, recurrent (HCC)    Not under good control. Not seeing her psychiatrist at this time. Will get her new referral to psych and add in wellbutrin for now. Continue cymbalta and valium. Call with any concerns.       Relevant Medications   diazepam (VALIUM) 5 MG tablet   buPROPion (WELLBUTRIN SR) 150 MG 12 hr tablet   Other Relevant Orders   Ambulatory referral to Psychiatry   AMB Referral to Managed Medicaid Care Management   Other Visit Diagnoses     Nocturnal enuresis    -  Primary   Lost total control of her bowels and bladder last night. Able to walk today. Concern for cauda equina. Will check MRI lumbar. F/u with neurosurg as scheduled.   Relevant Orders   Urinalysis, Routine w reflex microscopic   MR Lumbar Spine Wo Contrast   Encopresis       Lost total control of her bowels and bladder last night. Able to walk today. Concern for cauda equina. Will check MRI lumbar. F/u with neurosurg as scheduled.   Relevant Orders   MR Lumbar Spine Wo Contrast   Needs flu shot       Flu shot given today.        Follow up plan: Return in about 4 weeks (around 10/01/2023).

## 2023-09-03 NOTE — Assessment & Plan Note (Signed)
See discussion under depression.  ?

## 2023-09-03 NOTE — Assessment & Plan Note (Signed)
Not under good control. Not seeing her psychiatrist at this time. Will get her new referral to psych and add in wellbutrin for now. Continue cymbalta and valium. Call with any concerns.

## 2023-09-04 ENCOUNTER — Ambulatory Visit: Payer: Medicaid Other | Admitting: Family Medicine

## 2023-09-08 DIAGNOSIS — F411 Generalized anxiety disorder: Secondary | ICD-10-CM | POA: Diagnosis not present

## 2023-09-08 DIAGNOSIS — F330 Major depressive disorder, recurrent, mild: Secondary | ICD-10-CM | POA: Diagnosis not present

## 2023-09-19 ENCOUNTER — Ambulatory Visit: Payer: Medicaid Other | Admitting: Family Medicine

## 2023-09-19 ENCOUNTER — Encounter: Payer: Self-pay | Admitting: Family Medicine

## 2023-09-19 VITALS — BP 117/76 | HR 79 | Ht 69.0 in | Wt 158.2 lb

## 2023-09-19 DIAGNOSIS — R404 Transient alteration of awareness: Secondary | ICD-10-CM | POA: Insufficient documentation

## 2023-09-19 DIAGNOSIS — M4722 Other spondylosis with radiculopathy, cervical region: Secondary | ICD-10-CM

## 2023-09-19 DIAGNOSIS — M4726 Other spondylosis with radiculopathy, lumbar region: Secondary | ICD-10-CM

## 2023-09-19 DIAGNOSIS — M47816 Spondylosis without myelopathy or radiculopathy, lumbar region: Secondary | ICD-10-CM | POA: Insufficient documentation

## 2023-09-19 DIAGNOSIS — F339 Major depressive disorder, recurrent, unspecified: Secondary | ICD-10-CM

## 2023-09-19 DIAGNOSIS — M47812 Spondylosis without myelopathy or radiculopathy, cervical region: Secondary | ICD-10-CM | POA: Insufficient documentation

## 2023-09-19 NOTE — Patient Instructions (Signed)
Take 1 wellbutrin daily for 1 week then stop

## 2023-09-19 NOTE — Assessment & Plan Note (Signed)
Continue to follow with neurosurgery. Call with any concerns.

## 2023-09-19 NOTE — Progress Notes (Signed)
BP 117/76   Pulse 79   Ht 5\' 9"  (1.753 m)   Wt 158 lb 3.2 oz (71.8 kg)   LMP 11/29/2015 (Exact Date)   SpO2 98%   BMI 23.36 kg/m    Subjective:    Patient ID: Jessica Peters, female    DOB: 1981/10/30, 42 y.o.   MRN: [id number]  HPI: Jessica Peters is a 42 y.o. female  Chief Complaint  Patient presents with   Depression   No more accidents. She does note that she has not been feeling like herself. She notes that she was standing in the kitchen staring out one window then the other for an unknown amount of time today. She notes that she has been feeling this way for about a week. She was driving here today and was having difficulty determining if the light was red or green.   DEPRESSION- feeling out of it, has been staring into space. Mood has been about the same. She has heard from psych.  Mood status: uncontrolled Satisfied with current treatment?: no Symptom severity: severe  Duration of current treatment : chronic Side effects: yes Medication compliance: excellent compliance Psychotherapy/counseling: yes in the past Depressed mood: yes Anxious mood: yes Anhedonia: no Significant weight loss or gain: no Insomnia: no  Fatigue: yes Feelings of worthlessness or guilt: yes Impaired concentration/indecisiveness: yes Suicidal ideations: no Hopelessness: yes Crying spells: yes    09/19/2023    1:34 PM 09/03/2023   11:17 AM 07/25/2023    2:52 PM 05/12/2023    8:27 AM 12/05/2022   11:23 AM  Depression screen PHQ 2/9  Decreased Interest 3 3 1  0 3  Down, Depressed, Hopeless 2 3 1  0 2  PHQ - 2 Score 5 6 2  0 5  Altered sleeping 3 3 3 3 3   Tired, decreased energy 3 3 1  0 3  Change in appetite 3 3 1 3 3   Feeling bad or failure about yourself  2 3 1  0 3  Trouble concentrating 2 3 3  0 3  Moving slowly or fidgety/restless 3 2 0 0 3  Suicidal thoughts 0 0 0 0 0  PHQ-9 Score 21 23 11 6 23   Difficult doing work/chores Extremely dIfficult Extremely dIfficult Somewhat  difficult Not difficult at all Very difficult    Relevant past medical, surgical, family and social history reviewed and updated as indicated. Interim medical history since our last visit reviewed. Allergies and medications reviewed and updated.  Review of Systems  Constitutional: Negative.   Respiratory: Negative.    Cardiovascular: Negative.   Gastrointestinal: Negative.   Skin: Negative.   Neurological:  Positive for weakness and numbness. Negative for dizziness, tremors, seizures, syncope, facial asymmetry, speech difficulty, light-headedness and headaches.  Psychiatric/Behavioral:  Positive for confusion and dysphoric mood. Negative for agitation, behavioral problems, decreased concentration, hallucinations, self-injury, sleep disturbance and suicidal ideas. The patient is nervous/anxious. The patient is not hyperactive.     Per HPI unless specifically indicated above     Objective:    BP 117/76   Pulse 79   Ht 5\' 9"  (1.753 m)   Wt 158 lb 3.2 oz (71.8 kg)   LMP 11/29/2015 (Exact Date)   SpO2 98%   BMI 23.36 kg/m   Wt Readings from Last 3 Encounters:  09/19/23 158 lb 3.2 oz (71.8 kg)  09/03/23 156 lb 9.6 oz (71 kg)  06/05/23 156 lb 4.9 oz (70.9 kg)    Physical Exam Vitals and nursing note reviewed.  Constitutional:      General: She is not in acute distress.    Appearance: Normal appearance. She is normal weight. She is not ill-appearing, toxic-appearing or diaphoretic.  HENT:     Head: Normocephalic and atraumatic.     Right Ear: External ear normal.     Left Ear: External ear normal.     Nose: Nose normal.     Mouth/Throat:     Mouth: Mucous membranes are moist.     Pharynx: Oropharynx is clear.  Eyes:     General: No scleral icterus.       Right eye: No discharge.        Left eye: No discharge.     Extraocular Movements: Extraocular movements intact.     Conjunctiva/sclera: Conjunctivae normal.     Pupils: Pupils are equal, round, and reactive to light.   Cardiovascular:     Rate and Rhythm: Normal rate and regular rhythm.     Pulses: Normal pulses.     Heart sounds: Normal heart sounds. No murmur heard.    No friction rub. No gallop.  Pulmonary:     Effort: Pulmonary effort is normal. No respiratory distress.     Breath sounds: Normal breath sounds. No stridor. No wheezing, rhonchi or rales.  Chest:     Chest wall: No tenderness.  Musculoskeletal:        General: Normal range of motion.     Cervical back: Normal range of motion and neck supple.  Skin:    General: Skin is warm and dry.     Capillary Refill: Capillary refill takes less than 2 seconds.     Coloration: Skin is not jaundiced or pale.     Findings: No bruising, erythema, lesion or rash.  Neurological:     General: No focal deficit present.     Mental Status: She is alert and oriented to person, place, and time. Mental status is at baseline.  Psychiatric:        Mood and Affect: Mood normal.        Behavior: Behavior normal.        Thought Content: Thought content normal.        Judgment: Judgment normal.     Results for orders placed or performed in visit on 09/03/23  Urinalysis, Routine w reflex microscopic  Result Value Ref Range   Specific Gravity, UA 1.010 1.005 - 1.030   pH, UA 6.0 5.0 - 7.5   Color, UA Yellow Yellow   Appearance Ur Clear Clear   Leukocytes,UA Negative Negative   Protein,UA Negative Negative/Trace   Glucose, UA Negative Negative   Ketones, UA Negative Negative   RBC, UA Negative Negative   Bilirubin, UA Negative Negative   Urobilinogen, Ur 0.2 0.2 - 1.0 mg/dL   Nitrite, UA Negative Negative   Microscopic Examination Comment       Assessment & Plan:   Problem List Items Addressed This Visit       Musculoskeletal and Integument   DJD (degenerative joint disease) of cervical spine    Continue to follow with neurosurgery- considering surgery again.       Degenerative joint disease (DJD) of lumbar spine    Continue to follow with  neurosurgery. Call with any concerns.         Other   Depression, recurrent (HCC)    Still not doing well. Has heard from psychiatry. Encouraged her to reach out to them to discuss medication and see psychiatry as well  as counselors. Call with any concerns.       Transient alteration of awareness - Primary    New onset after starting wellbutrin about a week ago. 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 Will wean off wellbutrin and recheck in 1 week. Mood still not doing well.         Follow up plan: Return in about 1 week (around 09/26/2023).

## 2023-09-19 NOTE — Assessment & Plan Note (Signed)
Still not doing well. Has heard from psychiatry. Encouraged her to reach out to them to discuss medication and see psychiatry as well as counselors. Call with any concerns.

## 2023-09-19 NOTE — Assessment & Plan Note (Signed)
Continue to follow with neurosurgery- considering surgery again.

## 2023-09-19 NOTE — Assessment & Plan Note (Signed)
New onset after starting wellbutrin about a week ago. 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 Will wean off wellbutrin and recheck in 1 week. Mood still not doing well.

## 2023-10-02 ENCOUNTER — Telehealth: Payer: Medicaid Other | Admitting: Family Medicine

## 2023-12-08 ENCOUNTER — Other Ambulatory Visit: Payer: Self-pay | Admitting: Family Medicine

## 2023-12-08 ENCOUNTER — Other Ambulatory Visit: Payer: Self-pay

## 2023-12-08 MED ORDER — ONDANSETRON 4 MG PO TBDP
4.0000 mg | ORAL_TABLET | Freq: Three times a day (TID) | ORAL | 3 refills | Status: DC
  Filled 2023-12-08: qty 20, 7d supply, fill #0

## 2023-12-09 ENCOUNTER — Other Ambulatory Visit: Payer: Self-pay

## 2024-01-05 ENCOUNTER — Other Ambulatory Visit: Payer: Self-pay | Admitting: Podiatry

## 2024-01-06 ENCOUNTER — Telehealth: Payer: Self-pay | Admitting: Podiatry

## 2024-01-06 ENCOUNTER — Other Ambulatory Visit: Payer: Self-pay | Admitting: Podiatry

## 2024-01-06 ENCOUNTER — Encounter: Payer: Self-pay | Admitting: Podiatry

## 2024-01-06 DIAGNOSIS — Z79899 Other long term (current) drug therapy: Secondary | ICD-10-CM

## 2024-01-06 NOTE — Progress Notes (Signed)
Message sent to patient regarding the oral Lamisil refill.  Prior to refill she needs updated liver function panel.  She can come to the front desk and have the order printed and she can take it to any [facility].  If labs are WNL we will represcribe oral Lamisil 2 and 50 mg #90 daily  Felecia Shelling, DPM [facility]  Dr. Felecia Shelling, DPM    [address] [place], [place] [address]                Office [phone]  Fax [phone]

## 2024-01-06 NOTE — Telephone Encounter (Signed)
Patient called and stated that her pharmacy said that they need doctors approval to refill her medication. Thank you.

## 2024-01-07 NOTE — Telephone Encounter (Signed)
Left patient a message that the doctor messaged her through MyChart about. I let her know that she can come to the front desk and pick up the order and take it to [facility].

## 2024-01-12 ENCOUNTER — Encounter: Payer: Self-pay | Admitting: Family Medicine

## 2024-01-12 ENCOUNTER — Ambulatory Visit: Payer: Medicaid Other | Admitting: Family Medicine

## 2024-01-12 VITALS — BP 133/76 | HR 110 | Resp 15 | Ht 69.02 in | Wt 163.4 lb

## 2024-01-12 DIAGNOSIS — F339 Major depressive disorder, recurrent, unspecified: Secondary | ICD-10-CM | POA: Diagnosis not present

## 2024-01-12 DIAGNOSIS — Z23 Encounter for immunization: Secondary | ICD-10-CM | POA: Diagnosis not present

## 2024-01-12 DIAGNOSIS — M4722 Other spondylosis with radiculopathy, cervical region: Secondary | ICD-10-CM

## 2024-01-12 DIAGNOSIS — Z79899 Other long term (current) drug therapy: Secondary | ICD-10-CM

## 2024-01-12 MED ORDER — DULOXETINE HCL 30 MG PO CPEP: 90.0000 mg | ORAL_CAPSULE | Freq: Every day | ORAL | 1 refills | Status: DC

## 2024-01-12 MED ORDER — DIAZEPAM 5 MG PO TABS: 5.0000 mg | ORAL_TABLET | Freq: Three times a day (TID) | ORAL | 2 refills | Status: DC | PRN

## 2024-01-12 MED ORDER — GABAPENTIN 300 MG PO CAPS: 300.0000 mg | ORAL_CAPSULE | Freq: Three times a day (TID) | ORAL | 3 refills | Status: DC

## 2024-01-12 MED ORDER — GABAPENTIN 600 MG PO TABS: 600.0000 mg | ORAL_TABLET | Freq: Three times a day (TID) | ORAL | 3 refills | Status: DC

## 2024-01-12 NOTE — Progress Notes (Signed)
 BP 133/76 (BP Location: Left Arm, Patient Position: Sitting, Cuff Size: Normal)   Pulse (!) 110   Resp 15   Ht 5' 9.02" (1.753 m)   Wt 163 lb 6.4 oz (74.1 kg)   LMP 11/29/2015 (Exact Date)   SpO2 99%   BMI 24.12 kg/m    Subjective:    Patient ID: Jessica Peters, female    DOB: June 28, 1981, 43 y.o.   MRN: [id number]  HPI: CARYLE HELGESON is a 43 y.o. female  Chief Complaint  Patient presents with   Follow-up    Medications, pain in both arms, pain in neck   Depression    Improving and meds are working well.    DEPRESSION- never saw psychiatry. Has been doing OK without going right now. No further episodes like at last visit.  Mood status: better Satisfied with current treatment?: yes Symptom severity: mild  Duration of current treatment : chronic Side effects: no Medication compliance: excellent compliance Psychotherapy/counseling: yes in the past Previous psychiatric medications: cymbalta Depressed mood: yes Anxious mood: yes Anhedonia: no Significant weight loss or gain: no Insomnia: no  Fatigue: yes Feelings of worthlessness or guilt: no Impaired concentration/indecisiveness: no Suicidal ideations: no Hopelessness: no Crying spells: no    01/12/2024   10:01 AM 09/19/2023    1:34 PM 09/03/2023   11:17 AM 07/25/2023    2:52 PM 05/12/2023    8:27 AM  Depression screen PHQ 2/9  Decreased Interest 0 3 3 1  0  Down, Depressed, Hopeless 1 2 3 1  0  PHQ - 2 Score 1 5 6 2  0  Altered sleeping 1 3 3 3 3   Tired, decreased energy 1 3 3 1  0  Change in appetite 1 3 3 1 3   Feeling bad or failure about yourself  1 2 3 1  0  Trouble concentrating 0 2 3 3  0  Moving slowly or fidgety/restless 1 3 2  0 0  Suicidal thoughts 0 0 0 0 0  PHQ-9 Score 6 21 23 11 6   Difficult doing work/chores Somewhat difficult Extremely dIfficult Extremely dIfficult Somewhat difficult Not difficult at all      01/12/2024   10:02 AM 09/19/2023    1:34 PM 09/03/2023   11:17 AM 07/25/2023    2:53  PM  GAD 7 : Generalized Anxiety Score  Nervous, Anxious, on Edge 1 3 3 3   Control/stop worrying 1 2 3 3   Worry too much - different things 1 3 3 3   Trouble relaxing 1 0 3 3  Restless 1 3 3 3   Easily annoyed or irritable 0 1 2 3   Afraid - awful might happen 0 0 2 0  Total GAD 7 Score 5 12 19 18   Anxiety Difficulty Not difficult at all Extremely difficult Extremely difficult Somewhat difficult    CHRONIC PAIN- has not had a follow up with neurosurgery. Due to see them. Has been making dietary changes. She notes that she is having increased pain in her arms and neck. She notes that she has stopped drinking and thinks that that has been increasing her pain.  Pain control status: exacerbated Duration: chronic Location: neck and bilateral arms Quality: numb, tingling, shooting Current Pain Level: moderate- severe Previous Pain Level: moderate Breakthrough pain: yes Benefit from narcotic medications: N/A What Activities task can be accomplished with current medication? Able to work and care for her family Interested in weaning off narcotics: N/A   Stool softners/OTC fiber: no  Previous pain specialty evaluation: yes  Non-narcotic analgesic meds: yes Narcotic contract: N/A   Relevant past medical, surgical, family and social history reviewed and updated as indicated. Interim medical history since our last visit reviewed. Allergies and medications reviewed and updated.  Review of Systems  Constitutional: Negative.   Respiratory: Negative.    Cardiovascular: Negative.   Gastrointestinal: Negative.   Musculoskeletal:  Positive for arthralgias, myalgias, neck pain and neck stiffness. Negative for back pain, gait problem and joint swelling.  Skin: Negative.   Neurological:  Positive for weakness and numbness. Negative for dizziness, tremors, seizures, syncope, facial asymmetry, speech difficulty, light-headedness and headaches.  Psychiatric/Behavioral:  Positive for dysphoric mood.  Negative for agitation, behavioral problems, confusion, decreased concentration, hallucinations, self-injury, sleep disturbance and suicidal ideas. The patient is nervous/anxious. The patient is not hyperactive.     Per HPI unless specifically indicated above     Objective:    BP 133/76 (BP Location: Left Arm, Patient Position: Sitting, Cuff Size: Normal)   Pulse (!) 110   Resp 15   Ht 5' 9.02" (1.753 m)   Wt 163 lb 6.4 oz (74.1 kg)   LMP 11/29/2015 (Exact Date)   SpO2 99%   BMI 24.12 kg/m   Wt Readings from Last 3 Encounters:  01/12/24 163 lb 6.4 oz (74.1 kg)  09/19/23 158 lb 3.2 oz (71.8 kg)  09/03/23 156 lb 9.6 oz (71 kg)    Physical Exam Vitals and nursing note reviewed.  Constitutional:      General: She is not in acute distress.    Appearance: Normal appearance. She is not ill-appearing, toxic-appearing or diaphoretic.  HENT:     Head: Normocephalic and atraumatic.     Right Ear: External ear normal.     Left Ear: External ear normal.     Nose: Nose normal.     Mouth/Throat:     Mouth: Mucous membranes are moist.     Pharynx: Oropharynx is clear.  Eyes:     General: No scleral icterus.       Right eye: No discharge.        Left eye: No discharge.     Extraocular Movements: Extraocular movements intact.     Conjunctiva/sclera: Conjunctivae normal.     Pupils: Pupils are equal, round, and reactive to light.  Cardiovascular:     Rate and Rhythm: Normal rate and regular rhythm.     Pulses: Normal pulses.     Heart sounds: Normal heart sounds. No murmur heard.    No friction rub. No gallop.  Pulmonary:     Effort: Pulmonary effort is normal. No respiratory distress.     Breath sounds: Normal breath sounds. No stridor. No wheezing, rhonchi or rales.  Chest:     Chest wall: No tenderness.  Musculoskeletal:        General: Normal range of motion.     Cervical back: Normal range of motion and neck supple.  Skin:    General: Skin is warm and dry.     Capillary  Refill: Capillary refill takes less than 2 seconds.     Coloration: Skin is not jaundiced or pale.     Findings: No bruising, erythema, lesion or rash.  Neurological:     General: No focal deficit present.     Mental Status: She is alert and oriented to person, place, and time. Mental status is at baseline.  Psychiatric:        Mood and Affect: Mood normal.        Behavior:  Behavior normal.        Thought Content: Thought content normal.        Judgment: Judgment normal.     Results for orders placed or performed in visit on 01/12/24  Hepatic function panel   Collection Time: 01/12/24 10:30 AM  Result Value Ref Range   Total Protein 6.8 6.0 - 8.5 g/dL   Albumin 4.6 3.9 - 4.9 g/dL   Bilirubin Total 0.2 0.0 - 1.2 mg/dL   Bilirubin, Direct 6.96 0.00 - 0.40 mg/dL   Alkaline Phosphatase 34 (L) 44 - 121 IU/L   AST 16 0 - 40 IU/L   ALT 12 0 - 32 IU/L      Assessment & Plan:   Problem List Items Addressed This Visit       Musculoskeletal and Integument   DJD (degenerative joint disease) of cervical spine   Continue to follow with neurosurgery. Continue her diazepam and cymbalta. Continue to monitor. Call with any concerns.         Other   Depression, recurrent (HCC) - Primary   Under good control on current regimen. Continue current regimen. Continue to monitor. Call with any concerns. Refills given.        Relevant Medications   diazepam (VALIUM) 5 MG tablet   DULoxetine (CYMBALTA) 30 MG capsule   Other Visit Diagnoses       Long-term use of high-risk medication       Labs drawn today. Await results. Treat as needed.   Relevant Orders   Hepatic function panel (Completed)     Need for pneumococcal vaccination       Prevnar given today.   Relevant Orders   Pneumococcal conjugate vaccine 20-valent (Prevnar 20)        Follow up plan: Return in about 4 weeks (around 02/09/2024) for physical, and about 3 months for follow up.

## 2024-01-13 ENCOUNTER — Encounter: Payer: Self-pay | Admitting: Family Medicine

## 2024-01-13 ENCOUNTER — Other Ambulatory Visit: Payer: Self-pay | Admitting: Podiatry

## 2024-01-13 LAB — HEPATIC FUNCTION PANEL
ALT: 12 [IU]/L (ref 0–32)
AST: 16 [IU]/L (ref 0–40)
Albumin: 4.6 g/dL (ref 3.9–4.9)
Alkaline Phosphatase: 34 [IU]/L — ABNORMAL LOW (ref 44–121)
Bilirubin Total: 0.2 mg/dL (ref 0.0–1.2)
Bilirubin, Direct: 0.09 mg/dL (ref 0.00–0.40)
Total Protein: 6.8 g/dL (ref 6.0–8.5)

## 2024-01-13 MED ORDER — TERBINAFINE HCL 250 MG PO TABS: 250.0000 mg | ORAL_TABLET | Freq: Every day | ORAL | 0 refills | Status: DC

## 2024-01-18 ENCOUNTER — Encounter: Payer: Self-pay | Admitting: Family Medicine

## 2024-01-18 NOTE — Assessment & Plan Note (Signed)
 Under good control on current regimen. Continue current regimen. Continue to monitor. Call with any concerns. Refills given.

## 2024-01-18 NOTE — Assessment & Plan Note (Addendum)
 Continue to follow with neurosurgery. Continue her diazepam and cymbalta. Continue to monitor. Call with any concerns.

## 2024-02-03 ENCOUNTER — Ambulatory Visit: Payer: Medicaid Other | Admitting: Podiatry

## 2024-02-03 ENCOUNTER — Encounter: Payer: Self-pay | Admitting: Podiatry

## 2024-02-03 DIAGNOSIS — B351 Tinea unguium: Secondary | ICD-10-CM | POA: Diagnosis not present

## 2024-02-03 NOTE — Progress Notes (Signed)
 Chief Complaint  Patient presents with   Plantar Fasciitis    "They're doing pretty good.  I been wearing some inserts that I ordered from [employer]."   Nail Problem    "My nails are much better.  They're going out.  I actually went and had a pedicure. I was too ashamed of them before."    Subjective: 43 y.o. female presenting today for follow-up of thick dystrophic nails noted bilateral.  Onset for a few years now.  Concern for possible toenail fungus.  She has tried multiple different topical antifungal medications with no improvement.  She is also tried at home remedies such as Vicks vapor rub and apple cider vinegar soaks. Patient has noticed significant improvement.  She says that she no longer has itching to the skin.  Past Medical History:  Diagnosis Date   Allergy    Anemia    Anxiety    BRCA negative 02/2018   MyRisk neg   Endometriosis    Family history of breast cancer 02/2018   My Risk neg   Headache    h/o migraines   Increased risk of breast cancer 02/2018   IBIS=18.8%/riskscore=26.3%   Ovarian cyst    Ovarian cyst    PTSD (post-traumatic stress disorder)    PTSD (post-traumatic stress disorder)    Thrombocytopenia (HCC)    Vitamin D deficiency     Past Surgical History:  Procedure Laterality Date   ANTERIOR CERVICAL DECOMP/DISCECTOMY FUSION N/A 09/01/2021   Procedure: ANTERIOR CERVICAL DECOMPRESSION/DISCECTOMY FUSION Cervical six-Cervical seven, Open Treatment and REDUCTION OF FRACTURE;  Surgeon: Bethann Goo, DO;  Location: [facility] OR;  Service: Neurosurgery;  Laterality: N/A;   CYSTOSCOPY Bilateral 12/10/2018   Procedure: CYSTOSCOPY;  Surgeon: Conard Novak, MD;  Location: [facility];  Service: Gynecology;  Laterality: Bilateral;   DILITATION & CURRETTAGE/HYSTROSCOPY WITH NOVASURE ABLATION  12/07/2015   Procedure: DILATATION & CURETTAGE/HYSTEROSCOPY WITH NOVASURE ABLATION;  Surgeon: Conard Novak, MD;  Location: [facility] OR[facility];  Service: Gynecology;;    LAPAROSCOPIC HYSTERECTOMY Bilateral 12/10/2018   Procedure: HYSTERECTOMY TOTAL LAPAROSCOPIC BILATERAL SALPRINGETOMY;  Surgeon: Conard Novak, MD;  Location: [facility] ORS;  Service: Gynecology;  Laterality: Bilateral;   MOUTH SURGERY     TONSILLECTOMY     TUBAL LIGATION      No Known Allergies   B/L toes 09/02/2023  Objective: Physical Exam General: The patient is alert and oriented x3 in no acute distress.  Dermatology: Healthy skin integument.  The nails are painted today and so I am unable to clearly evaluate the nails however the skin appears to be healthy viable and soft and supple.  Significant improvement.  Patient states that when she did have a pedicure she noticed improvement of the nail plates as well  Vascular: Palpable pedal pulses bilaterally. No edema or erythema noted. Capillary refill within normal limits.  Neurological: Grossly intact via light touch  Musculoskeletal Exam: No pedal deformity noted  Assessment: #1 Onychomycosis of toenails #2 tinea pedis bilateral #3 arch pain bilateral  Plan of Care:  -Patient was evaluated. - Hepatic function on 01/12/2024 WNL. -Continue oral Lamisil until completed.  A refill for the second round of oral Lamisil was provided on 01/13/2024 -Continue Lotrisone cream as needed -Continue OTC arch supports which seem to help significantly with her arch pain -Return to clinic as needed  *Hair stylist standing all day  Felecia Shelling, DPM [facility]  Dr. Felecia Shelling, DPM    [address] [place], [place] [address]                Office [phone]  Fax [phone]

## 2024-02-16 ENCOUNTER — Ambulatory Visit (INDEPENDENT_AMBULATORY_CARE_PROVIDER_SITE_OTHER): Payer: Medicaid Other | Admitting: Family Medicine

## 2024-02-16 VITALS — BP 104/71 | HR 98 | Temp 98.0°F | Resp 16 | Ht 69.02 in | Wt 164.6 lb

## 2024-02-16 DIAGNOSIS — Z0000 Encounter for general adult medical examination without abnormal findings: Secondary | ICD-10-CM | POA: Diagnosis not present

## 2024-02-16 DIAGNOSIS — Z1231 Encounter for screening mammogram for malignant neoplasm of breast: Secondary | ICD-10-CM | POA: Diagnosis not present

## 2024-02-16 DIAGNOSIS — N393 Stress incontinence (female) (male): Secondary | ICD-10-CM

## 2024-02-16 DIAGNOSIS — R739 Hyperglycemia, unspecified: Secondary | ICD-10-CM | POA: Diagnosis not present

## 2024-02-16 NOTE — Patient Instructions (Signed)
[place] Evening Primrose Oil Black Cohash

## 2024-02-16 NOTE — Progress Notes (Signed)
 BP 104/71 (BP Location: Left Arm, Patient Position: Sitting, Cuff Size: Normal)   Pulse 98   Temp 98 F (36.7 C) (Oral)   Resp 16   Ht 5' 9.02" (1.753 m)   Wt 164 lb 9.6 oz (74.7 kg)   LMP 11/29/2015 (Exact Date)   SpO2 98%   BMI 24.30 kg/m    Subjective:    Patient ID: Jessica Peters, female    DOB: December 13, 1980, 43 y.o.   MRN: [id number]  HPI: Jessica Peters is a 43 y.o. female presenting on 02/16/2024 for comprehensive medical examination. Current medical complaints include:  Has been concerned about gaining weight, body odor and night sweats. Has also been having stress incontinence.  She currently lives with: kids and grand daughter Menopausal Symptoms: yes- night sweats and issues with body odor  Depression Screen done today and results listed below:     02/16/2024    8:31 AM 01/12/2024   10:01 AM 09/19/2023    1:34 PM 09/03/2023   11:17 AM 07/25/2023    2:52 PM  Depression screen PHQ 2/9  Decreased Interest 0 0 3 3 1   Down, Depressed, Hopeless 0 1 2 3 1   PHQ - 2 Score 0 1 5 6 2   Altered sleeping 0 1 3 3 3   Tired, decreased energy 0 1 3 3 1   Change in appetite 0 1 3 3 1   Feeling bad or failure about yourself  0 1 2 3 1   Trouble concentrating 0 0 2 3 3   Moving slowly or fidgety/restless 0 1 3 2  0  Suicidal thoughts 0 0 0 0 0  PHQ-9 Score 0 6 21 23 11   Difficult doing work/chores Not difficult at all Somewhat difficult Extremely dIfficult Extremely dIfficult Somewhat difficult    Past Medical History:  Past Medical History:  Diagnosis Date   Allergy    Anemia    Anxiety    Arthritis    BRCA negative 02/2018   MyRisk neg   Clotting disorder (HCC)    Depression    Endometriosis    Family history of breast cancer 02/2018   My Risk neg   Headache    h/o migraines   Increased risk of breast cancer 02/2018   IBIS=18.8%/riskscore=26.3%   Neuromuscular disorder (HCC)    Ovarian cyst    Ovarian cyst    PTSD (post-traumatic stress disorder)    PTSD  (post-traumatic stress disorder)    Thrombocytopenia (HCC)    Vitamin D deficiency     Surgical History:  Past Surgical History:  Procedure Laterality Date   ABDOMINAL HYSTERECTOMY     ANTERIOR CERVICAL DECOMP/DISCECTOMY FUSION N/A 09/01/2021   Procedure: ANTERIOR CERVICAL DECOMPRESSION/DISCECTOMY FUSION Cervical six-Cervical seven, Open Treatment and REDUCTION OF FRACTURE;  Surgeon: Bethann Goo, DO;  Location: [facility];  Service: Neurosurgery;  Laterality: N/A;   CYSTOSCOPY Bilateral 12/10/2018   Procedure: CYSTOSCOPY;  Surgeon: Conard Novak, MD;  Location: [facility];  Service: Gynecology;  Laterality: Bilateral;   DILITATION & CURRETTAGE/HYSTROSCOPY WITH NOVASURE ABLATION  12/07/2015   Procedure: DILATATION & CURETTAGE/HYSTEROSCOPY WITH NOVASURE ABLATION;  Surgeon: Conard Novak, MD;  Location: [facility];  Service: Gynecology;;   LAPAROSCOPIC HYSTERECTOMY Bilateral 12/10/2018   Procedure: HYSTERECTOMY TOTAL LAPAROSCOPIC BILATERAL SALPRINGETOMY;  Surgeon: Conard Novak, MD;  Location: [facility];  Service: Gynecology;  Laterality: Bilateral;   MOUTH SURGERY     TONSILLECTOMY     TUBAL LIGATION      Medications:  Current Outpatient  Medications on File Prior to Visit  Medication Sig   clotrimazole-betamethasone (LOTRISONE) cream Apply 1 Application topically daily.   diazepam (VALIUM) 5 MG tablet Take 1 tablet (5 mg total) by mouth every 8 (eight) hours as needed for anxiety or muscle spasms.   DULoxetine (CYMBALTA) 30 MG capsule Take 3 capsules (90 mg total) by mouth daily.   gabapentin (NEURONTIN) 300 MG capsule Take 1 capsule (300 mg total) by mouth 3 (three) times daily.   gabapentin (NEURONTIN) 600 MG tablet Take 1 tablet (600 mg total) by mouth 3 (three) times daily.   lidocaine (XYLOCAINE) 5 % ointment APPLY TO THE AFFECTED AREA AS NEEDED   ondansetron (ZOFRAN-ODT) 4 MG disintegrating tablet Take 1 tablet (4 mg total) by mouth 3 (three) times daily.   terbinafine  (LAMISIL) 250 MG tablet Take 1 tablet (250 mg total) by mouth daily.   triamcinolone cream (KENALOG) 0.1 % Apply 1 Application topically 2 (two) times daily.   No current facility-administered medications on file prior to visit.    Allergies:  No Known Allergies  Social History:  Social History   Socioeconomic History   Marital status: Single    Spouse name: Not on file   Number of children: Not on file   Years of education: Not on file   Highest education level: Associate degree: occupational, Scientist, product/process development, or vocational program  Occupational History   Not on file  Tobacco Use   Smoking status: Former    Current packs/day: 0.00    Average packs/day: 0.5 packs/day for 13.0 years (6.5 ttl pk-yrs)    Types: Cigarettes    Start date: 07/28/2008    Quit date: 07/28/2021    Years since quitting: 2.5   Smokeless tobacco: Never  Vaping Use   Vaping status: Never Used  Substance and Sexual Activity   Alcohol use: Yes    Comment: occasionally   Drug use: No   Sexual activity: Yes    Birth control/protection: Surgical  Other Topics Concern   Not on file  Social History Narrative   Not on file   Social Drivers of Health   Financial Resource Strain: Medium Risk (02/16/2024)   Overall Financial Resource Strain (CARDIA)    Difficulty of Paying Living Expenses: Somewhat hard  Food Insecurity: Food Insecurity Present (02/16/2024)   Hunger Vital Sign    Worried About Running Out of Food in the Last Year: Sometimes true    Ran Out of Food in the Last Year: Sometimes true  Transportation Needs: Unmet Transportation Needs (02/16/2024)   PRAPARE - Transportation    Lack of Transportation (Medical): Yes    Lack of Transportation (Non-Medical): Yes  Physical Activity: Insufficiently Active (02/16/2024)   Exercise Vital Sign    Days of Exercise per Week: 1 day    Minutes of Exercise per Session: 40 min  Stress: Stress Concern Present (02/16/2024)   [employer] of Occupational Health -  Occupational Stress Questionnaire    Feeling of Stress : To some extent  Social Connections: Moderately Isolated (02/16/2024)   Social Connection and Isolation Panel [NHANES]    Frequency of Communication with Friends and Family: Twice a week    Frequency of Social Gatherings with Friends and Family: Once a week    Attends Religious Services: Never    Database administrator or Organizations: No    Attends Engineer, structural: Not on file    Marital Status: Living with partner  Intimate Partner Violence: Not on file  Social History   Tobacco Use  Smoking Status Former   Current packs/day: 0.00   Average packs/day: 0.5 packs/day for 13.0 years (6.5 ttl pk-yrs)   Types: Cigarettes   Start date: 07/28/2008   Quit date: 07/28/2021   Years since quitting: 2.5  Smokeless Tobacco Never   Social History   Substance and Sexual Activity  Alcohol Use Yes   Comment: occasionally    Family History:  Family History  Problem Relation Age of Onset   Depression Mother    Depression Sister    Polycythemia Sister    Alcohol abuse Maternal Grandmother    Stroke Maternal Grandmother    Depression Maternal Grandmother    Cancer Maternal Grandmother        Liver   Alcohol abuse Maternal Grandfather    Stroke Maternal Grandfather    Cancer Maternal Grandfather        Liver   Breast cancer Paternal Grandmother 75   Stroke Paternal Grandmother    Clotting disorder Paternal Grandmother    Cancer Paternal Grandmother        Breast   Stroke Paternal Grandfather    Heart disease Paternal Grandfather    Breast cancer Paternal Aunt 21   Cancer Paternal Aunt        Breast    Past medical history, surgical history, medications, allergies, family history and social history reviewed with patient today and changes made to appropriate areas of the chart.   Review of Systems  Constitutional:  Positive for diaphoresis. Negative for chills, fever, malaise/fatigue and weight loss.  HENT:  Negative.    Eyes: Negative.   Respiratory: Negative.    Cardiovascular:  Positive for leg swelling. Negative for chest pain, palpitations, orthopnea, claudication and PND.  Gastrointestinal:  Positive for nausea. Negative for abdominal pain, blood in stool, constipation, diarrhea, heartburn, melena and vomiting.  Genitourinary:  Positive for frequency. Negative for dysuria, flank pain, hematuria and urgency.  Musculoskeletal:  Positive for back pain and neck pain. Negative for falls, joint pain and myalgias.  Skin: Negative.   Neurological:  Positive for weakness. Negative for dizziness, tingling, tremors, sensory change, speech change, focal weakness, seizures, loss of consciousness and headaches.  Endo/Heme/Allergies:  Positive for environmental allergies. Negative for polydipsia. Bruises/bleeds easily.  Psychiatric/Behavioral:  Positive for depression. Negative for hallucinations, memory loss, substance abuse and suicidal ideas. The patient is not nervous/anxious and does not have insomnia.    All other ROS negative except what is listed above and in the HPI.      Objective:    BP 104/71 (BP Location: Left Arm, Patient Position: Sitting, Cuff Size: Normal)   Pulse 98   Temp 98 F (36.7 C) (Oral)   Resp 16   Ht 5' 9.02" (1.753 m)   Wt 164 lb 9.6 oz (74.7 kg)   LMP 11/29/2015 (Exact Date)   SpO2 98%   BMI 24.30 kg/m   Wt Readings from Last 3 Encounters:  02/16/24 164 lb 9.6 oz (74.7 kg)  01/12/24 163 lb 6.4 oz (74.1 kg)  09/19/23 158 lb 3.2 oz (71.8 kg)    Physical Exam Vitals and nursing note reviewed.  Constitutional:      General: She is not in acute distress.    Appearance: Normal appearance. She is normal weight. She is not ill-appearing, toxic-appearing or diaphoretic.  HENT:     Head: Normocephalic and atraumatic.     Right Ear: Tympanic membrane, ear canal and external ear normal. There is no impacted  cerumen.     Left Ear: Tympanic membrane, ear canal and external  ear normal. There is no impacted cerumen.     Nose: Nose normal. No congestion or rhinorrhea.     Mouth/Throat:     Mouth: Mucous membranes are moist.     Pharynx: Oropharynx is clear. No oropharyngeal exudate or posterior oropharyngeal erythema.  Eyes:     General: No scleral icterus.       Right eye: No discharge.        Left eye: No discharge.     Extraocular Movements: Extraocular movements intact.     Conjunctiva/sclera: Conjunctivae normal.     Pupils: Pupils are equal, round, and reactive to light.  Neck:     Vascular: No carotid bruit.  Cardiovascular:     Rate and Rhythm: Normal rate and regular rhythm.     Pulses: Normal pulses.     Heart sounds: No murmur heard.    No friction rub. No gallop.  Pulmonary:     Effort: Pulmonary effort is normal. No respiratory distress.     Breath sounds: Normal breath sounds. No stridor. No wheezing, rhonchi or rales.  Chest:     Chest wall: No tenderness.  Abdominal:     General: Abdomen is flat. Bowel sounds are normal. There is no distension.     Palpations: Abdomen is soft. There is no mass.     Tenderness: There is no abdominal tenderness. There is no right CVA tenderness, left CVA tenderness, guarding or rebound.     Hernia: No hernia is present.  Genitourinary:    Comments: Breast and pelvic exams deferred with shared decision making Musculoskeletal:        General: No swelling, tenderness, deformity or signs of injury.     Cervical back: Normal range of motion and neck supple. No rigidity. No muscular tenderness.     Right lower leg: No edema.     Left lower leg: No edema.  Lymphadenopathy:     Cervical: No cervical adenopathy.  Skin:    General: Skin is warm and dry.     Capillary Refill: Capillary refill takes less than 2 seconds.     Coloration: Skin is not jaundiced or pale.     Findings: No bruising, erythema, lesion or rash.  Neurological:     General: No focal deficit present.     Mental Status: She is alert and  oriented to person, place, and time. Mental status is at baseline.     Cranial Nerves: No cranial nerve deficit.     Sensory: No sensory deficit.     Motor: No weakness.     Coordination: Coordination normal.     Gait: Gait normal.     Deep Tendon Reflexes: Reflexes normal.  Psychiatric:        Mood and Affect: Mood normal.        Behavior: Behavior normal.        Thought Content: Thought content normal.        Judgment: Judgment normal.     Results for orders placed or performed in visit on 01/12/24  Hepatic function panel   Collection Time: 01/12/24 10:30 AM  Result Value Ref Range   Total Protein 6.8 6.0 - 8.5 g/dL   Albumin 4.6 3.9 - 4.9 g/dL   Bilirubin Total 0.2 0.0 - 1.2 mg/dL   Bilirubin, Direct 1.61 0.00 - 0.40 mg/dL   Alkaline Phosphatase 34 (L) 44 - 121 IU/L   AST 16  0 - 40 IU/L   ALT 12 0 - 32 IU/L      Assessment & Plan:   Problem List Items Addressed This Visit   None Visit Diagnoses       Routine general medical examination at a health care facility    -  Primary   Vaccines up to date. Screening labs checked today. Pap N/A. Mammo ordered. Continue diet and exercise. Call with any concerns.   Relevant Orders   CBC with Differential/Platelet   Comprehensive metabolic panel   Lipid Panel w/o Chol/HDL Ratio   TSH     Stress incontinence       Referral to urology placed today.   Relevant Orders   Ambulatory referral to Urology     Encounter for screening mammogram for malignant neoplasm of breast       Mammogram ordered today.   Relevant Orders   MM 3D SCREENING MAMMOGRAM BILATERAL BREAST        Follow up plan: Return for as able for shave biopsy.   LABORATORY TESTING:  - Pap smear: not applicable  IMMUNIZATIONS:   - Tdap: Tetanus vaccination status reviewed: last tetanus booster within 10 years. - Influenza: Refused - Pneumovax: Not applicable - Prevnar: Refused - COVID: Refused  SCREENING: -Mammogram: Ordered today   PATIENT  COUNSELING:   Advised to take 1 mg of folate supplement per day if capable of pregnancy.   Sexuality: Discussed sexually transmitted diseases, partner selection, use of condoms, avoidance of unintended pregnancy  and contraceptive alternatives.   Advised to avoid cigarette smoking.  I discussed with the patient that most people either abstain from alcohol or drink within safe limits (<=14/week and <=4 drinks/occasion for males, <=7/weeks and <= 3 drinks/occasion for females) and that the risk for alcohol disorders and other health effects rises proportionally with the number of drinks per week and how often a drinker exceeds daily limits.  Discussed cessation/primary prevention of drug use and availability of treatment for abuse.   Diet: Encouraged to adjust caloric intake to maintain  or achieve ideal body weight, to reduce intake of dietary saturated fat and total fat, to limit sodium intake by avoiding high sodium foods and not adding table salt, and to maintain adequate dietary potassium and calcium preferably from fresh fruits, vegetables, and low-fat dairy products.    stressed the importance of regular exercise  Injury prevention: Discussed safety belts, safety helmets, smoke detector, smoking near bedding or upholstery.   Dental health: Discussed importance of regular tooth brushing, flossing, and dental visits.    NEXT PREVENTATIVE PHYSICAL DUE IN 1 YEAR. Return for as able for shave biopsy.

## 2024-02-17 ENCOUNTER — Encounter: Payer: Self-pay | Admitting: Family Medicine

## 2024-02-17 LAB — CBC WITH DIFFERENTIAL/PLATELET
Basophils Absolute: 0 10*3/uL (ref 0.0–0.2)
Basos: 0 %
EOS (ABSOLUTE): 0.4 10*3/uL (ref 0.0–0.4)
Eos: 5 %
Hematocrit: 42.2 % (ref 34.0–46.6)
Hemoglobin: 13.7 g/dL (ref 11.1–15.9)
Immature Grans (Abs): 0 10*3/uL (ref 0.0–0.1)
Immature Granulocytes: 0 %
Lymphocytes Absolute: 1.8 10*3/uL (ref 0.7–3.1)
Lymphs: 24 %
MCH: 31.1 pg (ref 26.6–33.0)
MCHC: 32.5 g/dL (ref 31.5–35.7)
MCV: 96 fL (ref 79–97)
Monocytes Absolute: 0.4 10*3/uL (ref 0.1–0.9)
Monocytes: 5 %
Neutrophils Absolute: 4.7 10*3/uL (ref 1.4–7.0)
Neutrophils: 66 %
Platelets: 147 10*3/uL — ABNORMAL LOW (ref 150–450)
RBC: 4.4 x10E6/uL (ref 3.77–5.28)
RDW: 11.9 % (ref 11.7–15.4)
WBC: 7.2 10*3/uL (ref 3.4–10.8)

## 2024-02-17 LAB — COMPREHENSIVE METABOLIC PANEL WITH GFR
ALT: 11 [IU]/L (ref 0–32)
AST: 18 [IU]/L (ref 0–40)
Albumin: 4.5 g/dL (ref 3.9–4.9)
Alkaline Phosphatase: 22 [IU]/L — ABNORMAL LOW (ref 44–121)
BUN/Creatinine Ratio: 13 (ref 9–23)
BUN: 11 mg/dL (ref 6–24)
Bilirubin Total: 0.7 mg/dL (ref 0.0–1.2)
CO2: 24 mmol/L (ref 20–29)
Calcium: 8.8 mg/dL (ref 8.7–10.2)
Chloride: 100 mmol/L (ref 96–106)
Creatinine, Ser: 0.84 mg/dL (ref 0.57–1.00)
Globulin, Total: 2 g/dL (ref 1.5–4.5)
Glucose: 146 mg/dL — ABNORMAL HIGH (ref 70–99)
Potassium: 3.5 mmol/L (ref 3.5–5.2)
Sodium: 137 mmol/L (ref 134–144)
Total Protein: 6.5 g/dL (ref 6.0–8.5)
eGFR: 89 mL/min/{1.73_m2}

## 2024-02-17 LAB — LIPID PANEL W/O CHOL/HDL RATIO
Cholesterol, Total: 164 mg/dL (ref 100–199)
HDL: 73 mg/dL
LDL Chol Calc (NIH): 76 mg/dL (ref 0–99)
Triglycerides: 80 mg/dL (ref 0–149)
VLDL Cholesterol Cal: 15 mg/dL (ref 5–40)

## 2024-02-17 LAB — TSH: TSH: 0.856 u[IU]/mL (ref 0.450–4.500)

## 2024-02-18 NOTE — Addendum Note (Signed)
 Addended by: Dorcas Carrow on: 02/18/2024 09:55 AM   Modules accepted: Orders

## 2024-02-20 LAB — HGB A1C W/O EAG: Hgb A1c MFr Bld: 5.3 % (ref 4.8–5.6)

## 2024-02-20 LAB — SPECIMEN STATUS REPORT

## 2024-04-12 ENCOUNTER — Ambulatory Visit: Payer: Medicaid Other | Admitting: Family Medicine

## 2024-04-29 ENCOUNTER — Other Ambulatory Visit: Payer: Self-pay | Admitting: Podiatry

## 2024-04-30 ENCOUNTER — Ambulatory Visit: Payer: Self-pay
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
## 2024-05-03 ENCOUNTER — Ambulatory Visit: Admitting: Urology

## 2024-05-03 ENCOUNTER — Ambulatory Visit: Admitting: Family Medicine

## 2024-05-03 VITALS — BP 123/83 | HR 84

## 2024-05-03 DIAGNOSIS — N393 Stress incontinence (female) (male): Secondary | ICD-10-CM

## 2024-05-03 DIAGNOSIS — N3946 Mixed incontinence: Secondary | ICD-10-CM | POA: Diagnosis not present

## 2024-05-03 LAB — URINALYSIS, COMPLETE
Bilirubin, UA: NEGATIVE
Glucose, UA: NEGATIVE
Leukocytes,UA: NEGATIVE
Nitrite, UA: NEGATIVE
Protein,UA: NEGATIVE
RBC, UA: NEGATIVE
Specific Gravity, UA: 1.025 (ref 1.005–1.030)
Urobilinogen, Ur: 0.2 mg/dL (ref 0.2–1.0)
pH, UA: 6 (ref 5.0–7.5)

## 2024-05-03 LAB — MICROSCOPIC EXAMINATION: Epithelial Cells (non renal): >10 /HPF — AB (ref 0–10)

## 2024-05-03 NOTE — Patient Instructions (Signed)
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
## 2024-05-06 ENCOUNTER — Telehealth: Payer: Self-pay

## 2024-05-06 DIAGNOSIS — S14109A Unspecified injury at unspecified level of cervical spinal cord, initial encounter: Secondary | ICD-10-CM

## 2024-05-06 DIAGNOSIS — M4726 Other spondylosis with radiculopathy, lumbar region: Secondary | ICD-10-CM

## 2024-05-06 DIAGNOSIS — M4722 Other spondylosis with radiculopathy, cervical region: Secondary | ICD-10-CM

## 2024-05-06 LAB — CULTURE, URINE COMPREHENSIVE

## 2024-05-06 NOTE — Telephone Encounter (Signed)
 Pharmacy called -requesting refill of terbinafine  250 mg #30

## 2024-06-03 NOTE — Telephone Encounter (Signed)
 Copied from CRM [id number]. Topic: Referral - Request for Referral >> Jun 03, 2024  2:55 PM Berwyn MATSU wrote: Did the patient discuss referral with their provider in the last year? Yes (If No - schedule appointment) (If Yes - send message)  Appointment offered? Yes patient declined   Type of order/referral and detailed reason for visit: Nuerosurgery   Preference of office, provider, location:  Dr. Norleen Shine [address] [place], [place] [address] Office: [phone] Please fax referral to : [phone] If referral order, have you been seen by this specialty before? Yes but patients neurosurgeon moved to [place]  needs a new provider  (If Yes, this issue or another issue? When? Where?  Can we respond through MyChart? Yes

## 2024-06-03 NOTE — Addendum Note (Signed)
 Addended by: VICCI DUWAINE SQUIBB on: 06/03/2024 07:07 PM   Modules accepted: Orders

## 2024-06-09 ENCOUNTER — Telehealth: Payer: Self-pay | Admitting: Family Medicine

## 2024-06-09 NOTE — Telephone Encounter (Signed)
 Overdue for appointment

## 2024-06-09 NOTE — Telephone Encounter (Signed)
 Prescription Request  06/09/2024  LOV: 02/16/2024  What is the name of the medication or equipment? diazepam  (VALIUM ) 5 MG   Have you contacted your pharmacy to request a refill? No   Which pharmacy would you like this sent to?  [id number] [place], [place] - [address] AT [facility] OF [address] [place] [address] Phone: [phone] Fax: [phone]    Patient notified that their request is being sent to the clinical staff for review and that they should receive a response within 2 business days.   Please advise at Mobile [phone] (mobile)

## 2024-06-16 MED ORDER — DIAZEPAM 5 MG PO TABS: 5.0000 mg | ORAL_TABLET | Freq: Three times a day (TID) | ORAL | 0 refills | Status: DC | PRN

## 2024-06-16 NOTE — Addendum Note (Signed)
 Addended by: VICCI DUWAINE SQUIBB on: 06/16/2024 01:38 PM   Modules accepted: Orders

## 2024-06-17 DIAGNOSIS — N3946 Mixed incontinence: Secondary | ICD-10-CM | POA: Diagnosis not present

## 2024-06-24 ENCOUNTER — Encounter: Payer: Self-pay | Admitting: Family Medicine

## 2024-06-24 ENCOUNTER — Ambulatory Visit: Admitting: Family Medicine

## 2024-06-24 VITALS — BP 112/78 | HR 64 | Temp 98.1°F | Wt 168.2 lb

## 2024-06-24 DIAGNOSIS — M4722 Other spondylosis with radiculopathy, cervical region: Secondary | ICD-10-CM

## 2024-06-24 DIAGNOSIS — R404 Transient alteration of awareness: Secondary | ICD-10-CM

## 2024-06-24 DIAGNOSIS — R300 Dysuria: Secondary | ICD-10-CM | POA: Diagnosis not present

## 2024-06-24 DIAGNOSIS — R739 Hyperglycemia, unspecified: Secondary | ICD-10-CM

## 2024-06-24 DIAGNOSIS — Z711 Person with feared health complaint in whom no diagnosis is made: Secondary | ICD-10-CM | POA: Diagnosis not present

## 2024-06-24 LAB — MICROSCOPIC EXAMINATION
Bacteria, UA: NONE SEEN
RBC, Urine: NONE SEEN /HPF (ref 0–2)

## 2024-06-24 LAB — URINALYSIS, ROUTINE W REFLEX MICROSCOPIC
Bilirubin, UA: NEGATIVE
Glucose, UA: NEGATIVE
Ketones, UA: NEGATIVE
Leukocytes,UA: NEGATIVE
Nitrite, UA: NEGATIVE
Protein,UA: NEGATIVE
RBC, UA: NEGATIVE
Specific Gravity, UA: 1.01 (ref 1.005–1.030)
Urobilinogen, Ur: 0.2 mg/dL (ref 0.2–1.0)
pH, UA: 7 (ref 5.0–7.5)

## 2024-06-24 LAB — BAYER DCA HB A1C WAIVED: HB A1C (BAYER DCA - WAIVED): 4.6 % — ABNORMAL LOW (ref 4.8–5.6)

## 2024-06-24 MED ORDER — LIDOCAINE 5 % EX OINT: TOPICAL_OINTMENT | Freq: Four times a day (QID) | CUTANEOUS | 6 refills | Status: AC | PRN

## 2024-06-24 MED ORDER — DULOXETINE HCL 30 MG PO CPEP: 90.0000 mg | ORAL_CAPSULE | Freq: Every day | ORAL | 1 refills | Status: DC

## 2024-06-24 MED ORDER — GABAPENTIN 300 MG PO CAPS: 300.0000 mg | ORAL_CAPSULE | Freq: Three times a day (TID) | ORAL | 3 refills | Status: DC

## 2024-06-24 MED ORDER — GABAPENTIN 600 MG PO TABS: 600.0000 mg | ORAL_TABLET | Freq: Three times a day (TID) | ORAL | 3 refills | Status: DC

## 2024-06-24 MED ORDER — ONDANSETRON 4 MG PO TBDP: 4.0000 mg | ORAL_TABLET | Freq: Three times a day (TID) | ORAL | 3 refills | Status: AC

## 2024-06-24 MED ORDER — DIAZEPAM 5 MG PO TABS: 5.0000 mg | ORAL_TABLET | Freq: Three times a day (TID) | ORAL | 1 refills | Status: DC | PRN

## 2024-06-24 NOTE — Progress Notes (Unsigned)
 BP 112/78   Pulse 64   Temp 98.1 F (36.7 C) (Oral)   Wt 168 lb 3.2 oz (76.3 kg)   LMP 11/29/2015 (Exact Date)   BMI 24.83 kg/m    Subjective:    Patient ID: Jessica Peters, female    DOB: 1981-02-21, 43 y.o.   MRN: [id number]  HPI: SULEIKA DONAVAN is a 43 y.o. female  Chief Complaint  Patient presents with  . Follow-up    Would like referral to new neurosurgeon. Current one has [place] of state.  Referral to Dorn Shine at  [facility]  . Urinary Tract Infection  . Hyperglycemia    March 2025. Would to recheck BGL   CHRONIC PAIN  Pain control status: {Blank single:19197::controlled,uncontrolled,better,worse,exacerbated,stable} Duration: {Blank single:19197::chronic,months,years} Location:  Quality: {Blank multiple:19196::sharp,dull,aching,burning,cramping,ill-defined,itchy,pressure-like,pulling,shooting,sore,stabbing,tender,tearing,throbbing} Current Pain Level: {Blank single:19197::mild,moderate,severe,1/10,2/10,3/10,4/10,5/10,6/10,7/10,8/10,9/10,10/10} Previous Pain Level: {Blank single:19197::mild,moderate,severe,1/10,2/10,3/10,4/10,5/10,6/10,7/10,8/10,9/10,10/10} Breakthrough pain: {Blank single:19197::yes,no} Benefit from narcotic medications: {Blank single:19197::yes,no} What Activities task can be accomplished with current medication? Interested in weaning off narcotics:{Blank single:19197::yes,no}   Stool softners/OTC fiber: {Blank single:19197::yes,no}  Previous pain specialty evaluation: {Blank single:19197::yes,no} Non-narcotic analgesic meds: {Blank single:19197::yes,no} Narcotic contract: {Blank single:19197::yes,no}  URINARY SYMPTOMS- has been following with urology, just had urodynamic testing last week. Since then she's been having some issues with her urine.  Duration:  Dysuria: {Blank  single:19197::yes,no,burning} Urinary frequency: {Blank single:19197::yes,no} Urgency: {Blank single:19197::yes,no} Small volume voids: {Blank single:19197::yes,no} Symptom severity: {Blank single:19197::yes,no} Urinary incontinence: {Blank single:19197::yes,no} Foul odor: {Blank single:19197::yes,no} Hematuria: {Blank single:19197::yes,no} Abdominal pain: {Blank single:19197::yes,no} Back pain: {Blank single:19197::yes,no} Suprapubic pain/pressure: {Blank single:19197::yes,no} Flank pain: {Blank single:19197::yes,no} Fever:  {Blank multiple:19196::yes,no,subjective,low grade} Vomiting: {Blank single:19197::yes,no} Relief with cranberry juice: {Blank single:19197::yes,no} Relief with pyridium: {Blank single:19197::yes,no} Status: better/worse/stable Previous urinary tract infection: {Blank single:19197::yes,no} Recurrent urinary tract infection: {Blank single:19197::yes,no} Sexual activity: No sexually active/monogomous/practicing safe sex History of sexually transmitted disease: {Blank single:19197::yes,no} Penile discharge: {Blank single:19197::yes,no} Treatments attempted: {Blank multiple:19196::none,antibiotics,pyridium,cranberry,increasing fluids}   Relevant past medical, surgical, family and social history reviewed and updated as indicated. Interim medical history since our last visit reviewed. Allergies and medications reviewed and updated.  Review of Systems  Constitutional: Negative.   Respiratory: Negative.    Cardiovascular: Negative.   Genitourinary:  Positive for difficulty urinating, dysuria and urgency. Negative for decreased urine volume, dyspareunia, enuresis, flank pain, frequency, genital sores, hematuria, menstrual problem, pelvic pain, vaginal bleeding, vaginal discharge and vaginal pain.  Musculoskeletal:  Positive for myalgias, neck pain and neck stiffness. Negative  for arthralgias, back pain, gait problem and joint swelling.  Neurological:  Positive for weakness and numbness. Negative for dizziness, tremors, seizures, syncope, facial asymmetry, speech difficulty, light-headedness and headaches.  Psychiatric/Behavioral: Negative.      Per HPI unless specifically indicated above     Objective:    BP 112/78   Pulse 64   Temp 98.1 F (36.7 C) (Oral)   Wt 168 lb 3.2 oz (76.3 kg)   LMP 11/29/2015 (Exact Date)   BMI 24.83 kg/m   Wt Readings from Last 3 Encounters:  06/24/24 168 lb 3.2 oz (76.3 kg)  02/16/24 164 lb 9.6 oz (74.7 kg)  01/12/24 163 lb 6.4 oz (74.1 kg)    Physical Exam  Results for orders placed or performed in visit on 05/03/24  Microscopic Examination   Collection Time: 05/03/24  8:20 AM   Urine  Result Value Ref Range   WBC, UA 0-5 0 - 5 /hpf   RBC, Urine 0-2 0 - 2 /hpf   Epithelial Cells (non renal) >10 (A) 0 -  10 /hpf   Mucus, UA Present (A) Not Estab.   Bacteria, UA Moderate (A) None seen/Few  Urinalysis, Complete   Collection Time: 05/03/24  8:20 AM  Result Value Ref Range   Specific Gravity, UA 1.025 1.005 - 1.030   pH, UA 6.0 5.0 - 7.5   Color, UA Yellow Yellow   Appearance Ur Clear Clear   Leukocytes,UA Negative Negative   Protein,UA Negative Negative/Trace   Glucose, UA Negative Negative   Ketones, UA Trace (A) Negative   RBC, UA Negative Negative   Bilirubin, UA Negative Negative   Urobilinogen, Ur 0.2 0.2 - 1.0 mg/dL   Nitrite, UA Negative Negative   Microscopic Examination Comment    Microscopic Examination See below:   CULTURE, URINE COMPREHENSIVE   Collection Time: 05/03/24  8:42 AM   Specimen: Urine   UR  Result Value Ref Range   Urine Culture, Comprehensive Final report    Organism ID, Bacteria Comment       Assessment & Plan:   Problem List Items Addressed This Visit   None    Follow up plan: No follow-ups on file.

## 2024-06-25 NOTE — Assessment & Plan Note (Signed)
 Has referral to neurosurgery pending- she will call them. Will continue her on her valium  for muscle spasms. Continue to monitor. Follow up 3 months.
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
## 2024-07-05 ENCOUNTER — Telehealth: Payer: Self-pay

## 2024-07-05 DIAGNOSIS — M4722 Other spondylosis with radiculopathy, cervical region: Secondary | ICD-10-CM

## 2024-07-05 NOTE — Telephone Encounter (Signed)
 Copied from CRM #[id number]. Topic: Referral - Status >> Jul 05, 2024 10:22 AM [name] wrote: Reason for CRM: Patient is wondering why her neurosurgery referral is still pending. She would like a call back with clarification as soon as possible.   *Patient is aware of the same day turn around time and will be looking out for further communication by a phone call or MyChart message.  Dr. Norleen Shine [address] [place], [place] [address] Office: [phone] Please fax referral to : [phone]

## 2024-07-06 NOTE — Telephone Encounter (Signed)
 I'm not sure what happened but there's no way for me to select [facility] for a neurosurgery referral. I've replaced the referral.

## 2024-07-07 ENCOUNTER — Ambulatory Visit
Admission: RE | Admit: 2024-07-07 | Discharge: 2024-07-07 | Disposition: A | Discharge status: Home / Self Care | Source: Ambulatory Visit | Attending: Family Medicine | Admitting: Family Medicine

## 2024-07-07 DIAGNOSIS — Z1231 Encounter for screening mammogram for malignant neoplasm of breast: Secondary | ICD-10-CM | POA: Diagnosis not present

## 2024-07-09 ENCOUNTER — Other Ambulatory Visit: Payer: Self-pay | Admitting: Family Medicine

## 2024-07-09 DIAGNOSIS — R928 Other abnormal and inconclusive findings on diagnostic imaging of breast: Secondary | ICD-10-CM

## 2024-07-09 DIAGNOSIS — R921 Mammographic calcification found on diagnostic imaging of breast: Secondary | ICD-10-CM

## 2024-07-13 ENCOUNTER — Ambulatory Visit
Admission: RE | Admit: 2024-07-13 | Discharge: 2024-07-13 | Disposition: A | Discharge status: Home / Self Care | Source: Ambulatory Visit | Attending: Family Medicine | Admitting: Family Medicine

## 2024-07-13 DIAGNOSIS — R921 Mammographic calcification found on diagnostic imaging of breast: Secondary | ICD-10-CM | POA: Insufficient documentation

## 2024-07-13 DIAGNOSIS — R928 Other abnormal and inconclusive findings on diagnostic imaging of breast: Secondary | ICD-10-CM | POA: Insufficient documentation

## 2024-07-19 ENCOUNTER — Ambulatory Visit: Admitting: Urology

## 2024-07-19 VITALS — BP 111/74 | HR 85 | Ht 69.0 in | Wt 165.0 lb

## 2024-07-19 DIAGNOSIS — N3946 Mixed incontinence: Secondary | ICD-10-CM | POA: Diagnosis not present

## 2024-07-19 DIAGNOSIS — N393 Stress incontinence (female) (male): Secondary | ICD-10-CM

## 2024-07-19 LAB — URINALYSIS, COMPLETE
Bilirubin, UA: NEGATIVE
Glucose, UA: NEGATIVE
Ketones, UA: NEGATIVE
Leukocytes,UA: NEGATIVE
Nitrite, UA: NEGATIVE
Protein,UA: NEGATIVE
RBC, UA: NEGATIVE
Specific Gravity, UA: 1.01 (ref 1.005–1.030)
Urobilinogen, Ur: 0.2 mg/dL (ref 0.2–1.0)
pH, UA: 6 (ref 5.0–7.5)

## 2024-07-19 LAB — MICROSCOPIC EXAMINATION: Epithelial Cells (non renal): >10 /HPF — AB (ref 0–10)

## 2024-07-19 MED ORDER — GEMTESA 75 MG PO TABS: 75.0000 mg | ORAL_TABLET | Freq: Every day | ORAL | 11 refills | Status: DC

## 2024-07-19 MED ORDER — GEMTESA 75 MG PO TABS: 75.0000 mg | ORAL_TABLET | Freq: Every day | ORAL | Status: AC
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
## 2024-07-19 NOTE — Addendum Note (Signed)
 Addended byBETHA CORIE PLATER on: 07/19/2024 08:55 AM   Modules accepted: Orders

## 2024-07-23 ENCOUNTER — Ambulatory Visit: Admitting: Family Medicine

## 2024-08-09 DIAGNOSIS — S14109A Unspecified injury at unspecified level of cervical spinal cord, initial encounter: Secondary | ICD-10-CM | POA: Diagnosis not present

## 2024-08-09 DIAGNOSIS — X500XXA Overexertion from strenuous movement or load, initial encounter: Secondary | ICD-10-CM | POA: Diagnosis not present

## 2024-08-09 DIAGNOSIS — R1310 Dysphagia, unspecified: Secondary | ICD-10-CM | POA: Diagnosis not present

## 2024-08-09 DIAGNOSIS — Z981 Arthrodesis status: Secondary | ICD-10-CM | POA: Diagnosis not present

## 2024-08-09 DIAGNOSIS — M4322 Fusion of spine, cervical region: Secondary | ICD-10-CM | POA: Diagnosis not present

## 2024-08-09 DIAGNOSIS — M4802 Spinal stenosis, cervical region: Secondary | ICD-10-CM | POA: Diagnosis not present

## 2024-08-09 DIAGNOSIS — Z87828 Personal history of other (healed) physical injury and trauma: Secondary | ICD-10-CM | POA: Diagnosis not present

## 2024-08-10 ENCOUNTER — Other Ambulatory Visit: Payer: Self-pay

## 2024-09-02 ENCOUNTER — Encounter: Payer: Self-pay | Admitting: Podiatry

## 2024-09-03 ENCOUNTER — Other Ambulatory Visit: Payer: Self-pay | Admitting: Podiatry

## 2024-09-03 MED ORDER — TERBINAFINE HCL 250 MG PO TABS: 250.0000 mg | ORAL_TABLET | Freq: Every day | ORAL | 0 refills | Status: AC

## 2024-09-09 DIAGNOSIS — M47812 Spondylosis without myelopathy or radiculopathy, cervical region: Secondary | ICD-10-CM | POA: Diagnosis not present

## 2024-09-13 ENCOUNTER — Other Ambulatory Visit: Payer: Self-pay

## 2024-09-13 ENCOUNTER — Emergency Department

## 2024-09-13 ENCOUNTER — Encounter: Payer: Self-pay | Admitting: *Deleted

## 2024-09-13 ENCOUNTER — Emergency Department
Admission: EM | Admit: 2024-09-13 | Discharge: 2024-09-13 | Disposition: A | Discharge status: Home / Self Care | Attending: Emergency Medicine | Admitting: Emergency Medicine

## 2024-09-13 DIAGNOSIS — M25551 Pain in right hip: Secondary | ICD-10-CM | POA: Diagnosis present

## 2024-09-13 DIAGNOSIS — M79671 Pain in right foot: Secondary | ICD-10-CM | POA: Insufficient documentation

## 2024-09-13 DIAGNOSIS — R0789 Other chest pain: Secondary | ICD-10-CM | POA: Diagnosis not present

## 2024-09-13 DIAGNOSIS — M79661 Pain in right lower leg: Secondary | ICD-10-CM | POA: Insufficient documentation

## 2024-09-13 DIAGNOSIS — M25561 Pain in right knee: Secondary | ICD-10-CM | POA: Insufficient documentation

## 2024-09-13 DIAGNOSIS — Y92410 Unspecified street and highway as the place of occurrence of the external cause: Secondary | ICD-10-CM | POA: Insufficient documentation

## 2024-09-13 MED ORDER — CYCLOBENZAPRINE HCL 5 MG PO TABS: 5.0000 mg | ORAL_TABLET | Freq: Three times a day (TID) | ORAL | 0 refills | Status: DC | PRN

## 2024-09-13 MED ORDER — KETOROLAC TROMETHAMINE 30 MG/ML IJ SOLN
30.0000 mg | Freq: Once | INTRAMUSCULAR | Status: AC
  Administered 2024-09-13: 30 mg via INTRAMUSCULAR
  Filled 2024-09-13: qty 1

## 2024-09-13 MED ORDER — CYCLOBENZAPRINE HCL 10 MG PO TABS
5.0000 mg | ORAL_TABLET | Freq: Once | ORAL | Status: AC
  Administered 2024-09-13: 5 mg via ORAL
  Filled 2024-09-13: qty 1

## 2024-09-13 MED ORDER — ACETAMINOPHEN 325 MG PO TABS
650.0000 mg | ORAL_TABLET | Freq: Once | ORAL | Status: AC
  Administered 2024-09-13: 650 mg via ORAL
  Filled 2024-09-13: qty 2
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
## 2024-09-16 ENCOUNTER — Encounter: Payer: Self-pay | Admitting: Family Medicine
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
## 2024-09-17 ENCOUNTER — Ambulatory Visit: Admitting: Nurse Practitioner

## 2024-09-17 ENCOUNTER — Encounter: Payer: Self-pay | Admitting: Nurse Practitioner

## 2024-09-17 VITALS — BP 124/85 | HR 94 | Temp 98.9°F | Resp 14 | Ht 70.0 in | Wt 168.6 lb

## 2024-09-17 DIAGNOSIS — S14109A Unspecified injury at unspecified level of cervical spinal cord, initial encounter: Secondary | ICD-10-CM | POA: Diagnosis not present

## 2024-09-17 MED ORDER — IBUPROFEN 800 MG PO TABS: 800.0000 mg | ORAL_TABLET | Freq: Four times a day (QID) | ORAL | 0 refills | Status: AC | PRN

## 2024-09-17 MED ORDER — GABAPENTIN 300 MG PO CAPS: 300.0000 mg | ORAL_CAPSULE | Freq: Three times a day (TID) | ORAL | 3 refills | Status: DC

## 2024-09-17 MED ORDER — GABAPENTIN 600 MG PO TABS: 600.0000 mg | ORAL_TABLET | Freq: Three times a day (TID) | ORAL | 3 refills | Status: DC

## 2024-09-17 MED ORDER — DULOXETINE HCL 30 MG PO CPEP: 90.0000 mg | ORAL_CAPSULE | Freq: Every day | ORAL | 1 refills | Status: AC
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
## 2024-09-27 ENCOUNTER — Ambulatory Visit: Admitting: Family Medicine

## 2024-10-04 DIAGNOSIS — Z981 Arthrodesis status: Secondary | ICD-10-CM | POA: Diagnosis not present

## 2024-10-04 DIAGNOSIS — M4316 Spondylolisthesis, lumbar region: Secondary | ICD-10-CM | POA: Diagnosis not present

## 2024-10-07 DIAGNOSIS — M47813 Spondylosis without myelopathy or radiculopathy, cervicothoracic region: Secondary | ICD-10-CM | POA: Diagnosis not present

## 2024-10-07 DIAGNOSIS — S199XXD Unspecified injury of neck, subsequent encounter: Secondary | ICD-10-CM | POA: Diagnosis not present

## 2024-10-11 ENCOUNTER — Ambulatory Visit: Admitting: Urology

## 2024-10-11 VITALS — BP 127/78 | HR 102 | Ht 70.0 in | Wt 169.8 lb

## 2024-10-11 DIAGNOSIS — N3946 Mixed incontinence: Secondary | ICD-10-CM | POA: Diagnosis not present

## 2024-10-11 DIAGNOSIS — N393 Stress incontinence (female) (male): Secondary | ICD-10-CM

## 2024-10-11 LAB — URINALYSIS, COMPLETE
Bilirubin, UA: NEGATIVE
Glucose, UA: NEGATIVE
Ketones, UA: NEGATIVE
Leukocytes,UA: NEGATIVE
Nitrite, UA: NEGATIVE
Protein,UA: NEGATIVE
RBC, UA: NEGATIVE
Specific Gravity, UA: 1.01 (ref 1.005–1.030)
Urobilinogen, Ur: 0.2 mg/dL (ref 0.2–1.0)
pH, UA: 6 (ref 5.0–7.5)

## 2024-10-11 LAB — MICROSCOPIC EXAMINATION
Bacteria, UA: NONE SEEN
RBC, Urine: NONE SEEN /HPF (ref 0–2)

## 2024-10-11 MED ORDER — MIRABEGRON ER 50 MG PO TB24: 50.0000 mg | ORAL_TABLET | Freq: Every day | ORAL | 11 refills | Status: DC
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
## 2024-10-14 ENCOUNTER — Encounter: Payer: Self-pay | Admitting: Nurse Practitioner

## 2024-10-14 ENCOUNTER — Telehealth: Admitting: Nurse Practitioner

## 2024-10-14 VITALS — Ht 70.0 in | Wt 169.0 lb

## 2024-10-14 DIAGNOSIS — R051 Acute cough: Secondary | ICD-10-CM

## 2024-10-14 MED ORDER — METHYLPREDNISOLONE 4 MG PO TBPK: ORAL_TABLET | ORAL | 0 refills | Status: DC

## 2024-10-14 MED ORDER — AMOXICILLIN-POT CLAVULANATE 875-125 MG PO TABS: 1.0000 | ORAL_TABLET | Freq: Two times a day (BID) | ORAL | 0 refills | Status: DC

## 2024-10-14 NOTE — Progress Notes (Signed)
 Ht 5' 10 (1.778 m)   Wt 169 lb (76.7 kg)   LMP 11/29/2015 (Exact Date)   BMI 24.25 kg/m    Subjective:    Patient ID: Jessica Peters, female    DOB: 10/21/1981, 43 y.o.   MRN: [id number]  HPI: Jessica Peters is a 43 y.o. female  Chief Complaint  Patient presents with   Sinus Problem    Patient states she doesn't think she has a fever. It started Monday night with a tickle in her throat, and then she woke up in the middle of the night coughing up green mucous. Body aches, vomiting, and coughing. Patient has taken medicine.   UPPER RESPIRATORY TRACT INFECTION Worst symptom: symptoms started on Monday night Fever: no Cough: yes Shortness of breath: no Wheezing: no Chest pain: yes, with cough Chest tightness: no Chest congestion: yes Nasal congestion: yes Runny nose: yes Post nasal drip: yes Sneezing: no Sore throat: yes Swollen glands: no Sinus pressure: no Headache: yes Face pain: no Toothache: no Ear pain: no bilateral Ear pressure: no bilateral Eyes red/itching:no Eye drainage/crusting: no  Vomiting: no Rash: no Fatigue: yes Sick contacts: no Strep contacts: no  Context: worse Recurrent sinusitis: no Relief with OTC cold/cough medications: no  Treatments attempted: mucinex   Relevant past medical, surgical, family and social history reviewed and updated as indicated. Interim medical history since our last visit reviewed. Allergies and medications reviewed and updated.  Review of Systems  Constitutional:  Positive for fatigue. Negative for fever.  HENT:  Positive for congestion, postnasal drip, rhinorrhea, sinus pressure, sinus pain and sore throat. Negative for dental problem, ear pain and sneezing.   Respiratory:  Positive for cough. Negative for shortness of breath and wheezing.   Cardiovascular:  Negative for chest pain.  Gastrointestinal:  Negative for vomiting.  Skin:  Negative for rash.  Neurological:  Positive for headaches.    Per HPI  unless specifically indicated above     Objective:    Ht 5' 10 (1.778 m)   Wt 169 lb (76.7 kg)   LMP 11/29/2015 (Exact Date)   BMI 24.25 kg/m   Wt Readings from Last 3 Encounters:  10/14/24 169 lb (76.7 kg)  10/11/24 169 lb 12.8 oz (77 kg)  09/17/24 168 lb 9.6 oz (76.5 kg)    Physical Exam Vitals and nursing note reviewed.  HENT:     Head: Normocephalic.     Right Ear: Hearing normal.     Left Ear: Hearing normal.     Nose: Nose normal.  Eyes:     Pupils: Pupils are equal, round, and reactive to light.  Pulmonary:     Effort: Pulmonary effort is normal. No respiratory distress.  Neurological:     Mental Status: She is alert.  Psychiatric:        Mood and Affect: Mood normal.        Behavior: Behavior normal.        Thought Content: Thought content normal.        Judgment: Judgment normal.     Results for orders placed or performed in visit on 10/11/24  Microscopic Examination   Collection Time: 10/11/24  9:08 AM   Urine  Result Value Ref Range   WBC, UA 0-5 0 - 5 /hpf   RBC, Urine None seen 0 - 2 /hpf   Epithelial Cells (non renal) 0-10 0 - 10 /hpf   Bacteria, UA None seen None seen/Few  Urinalysis, Complete   Collection  Time: 10/11/24  9:08 AM  Result Value Ref Range   Specific Gravity, UA 1.010 1.005 - 1.030   pH, UA 6.0 5.0 - 7.5   Color, UA Yellow Yellow   Appearance Ur Clear Clear   Leukocytes,UA Negative Negative   Protein,UA Negative Negative/Trace   Glucose, UA Negative Negative   Ketones, UA Negative Negative   RBC, UA Negative Negative   Bilirubin, UA Negative Negative   Urobilinogen, Ur 0.2 0.2 - 1.0 mg/dL   Nitrite, UA Negative Negative   Microscopic Examination Comment    Microscopic Examination See below:       Assessment & Plan:   Problem List Items Addressed This Visit   None Visit Diagnoses       Acute cough    -  Primary   Will treat with Augmentin  and Medrol  dose pak.  Complete course of medication. Continue with OTC symptom  management.  FU if not improved.        Follow up plan: No follow-ups on file.   This visit was completed via MyChart due to the restrictions of the COVID-19 pandemic. All issues as above were discussed and addressed. Physical exam was done as above through visual confirmation on MyChart. If it was felt that the patient should be evaluated in the office, they were directed there. The patient verbally consented to this visit. Location of the patient: Home Location of the provider: Office Those involved with this call:  Provider: Darice Petty, NP CMA: Joya Louder, CMA Front Desk/Registration: Claretta Maiden This encounter was conducted via video.  I spent 20 minutes dedicated to the care of this patient on the date of this encounter to include previsit review of plan of care, medications, follow up, face to face time with the patient, and post visit ordering of testing.

## 2024-11-13 ENCOUNTER — Encounter: Payer: Self-pay | Admitting: Podiatry

## 2024-11-14 ENCOUNTER — Other Ambulatory Visit: Payer: Self-pay | Admitting: Family Medicine

## 2024-11-16 ENCOUNTER — Other Ambulatory Visit: Payer: Self-pay | Admitting: Family Medicine

## 2024-11-16 NOTE — Telephone Encounter (Signed)
 Copied from CRM [id number]. Topic: Clinical - Medication Refill >> Nov 16, 2024  9:46 AM Amy B wrote: Medication:  diazepam  (VALIUM ) 5 MG tablet   Has the patient contacted their pharmacy? Yes (Agent: If no, request that the patient contact the pharmacy for the refill. If patient does not wish to contact the pharmacy document the reason why and proceed with request.) (Agent: If yes, when and what did the pharmacy advise?)  This is the patient's preferred pharmacy:  [id number] [place], [place] - [address] AT [facility] OF [address] [place] [address] Phone: [phone] Fax: [phone]  Is this the correct pharmacy for this prescription? Yes If no, delete pharmacy and type the correct one.   Has the prescription been filled recently? No  Is the patient out of the medication? Yes  Has the patient been seen for an appointment in the last year OR does the patient have an upcoming appointment? Yes  Can we respond through MyChart? Yes  Agent: Please be advised that Rx refills may take up to 3 business days. We ask that you follow-up with your pharmacy.
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
## 2024-11-20 ENCOUNTER — Other Ambulatory Visit: Payer: Self-pay | Admitting: Family Medicine

## 2024-11-22 ENCOUNTER — Ambulatory Visit: Payer: Self-pay

## 2024-11-22 NOTE — Telephone Encounter (Signed)
 Patient has not been seen for regular appointment in months. Needs appointment for refill.
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
## 2024-11-22 NOTE — Telephone Encounter (Signed)
 Routing to provider. Patient requesting refill on Diazepam .

## 2024-11-23 NOTE — Telephone Encounter (Signed)
 Called the patient to get her scheduled in. I did leave a VM, so she can follow back up.  If patient calls back in , it's ok for E2C2 to schedule her in for an office visit for prescription refills.

## 2024-11-26 NOTE — Telephone Encounter (Signed)
OK to use same day?

## 2024-11-29 ENCOUNTER — Encounter: Payer: Self-pay | Admitting: Neurology

## 2024-11-29 ENCOUNTER — Telehealth: Payer: Self-pay | Admitting: Neurology

## 2024-11-29 ENCOUNTER — Ambulatory Visit: Admitting: Neurology

## 2024-11-29 NOTE — Telephone Encounter (Signed)
 Patient no show for appointment due to overslept. Patient said experiencing headaches, neck pain, fogginess.

## 2024-11-30 ENCOUNTER — Other Ambulatory Visit: Payer: Self-pay | Admitting: Podiatry

## 2024-11-30 MED ORDER — CLOTRIMAZOLE-BETAMETHASONE 1-0.05 % EX CREA: 1.0000 | TOPICAL_CREAM | Freq: Every day | CUTANEOUS | 2 refills | Status: AC

## 2024-12-01 ENCOUNTER — Other Ambulatory Visit: Payer: Self-pay | Admitting: Medical Genetics

## 2024-12-07 ENCOUNTER — Ambulatory Visit (INDEPENDENT_AMBULATORY_CARE_PROVIDER_SITE_OTHER): Payer: Self-pay | Admitting: Family Medicine

## 2024-12-07 ENCOUNTER — Encounter: Payer: Self-pay | Admitting: Family Medicine

## 2024-12-07 VITALS — BP 120/82 | HR 98 | Temp 98.0°F | Ht 70.0 in | Wt 169.4 lb

## 2024-12-07 DIAGNOSIS — F4310 Post-traumatic stress disorder, unspecified: Secondary | ICD-10-CM | POA: Diagnosis not present

## 2024-12-07 DIAGNOSIS — M4722 Other spondylosis with radiculopathy, cervical region: Secondary | ICD-10-CM | POA: Diagnosis not present

## 2024-12-07 DIAGNOSIS — F419 Anxiety disorder, unspecified: Secondary | ICD-10-CM | POA: Diagnosis not present

## 2024-12-07 MED ORDER — GABAPENTIN 600 MG PO TABS: 600.0000 mg | ORAL_TABLET | Freq: Three times a day (TID) | ORAL | 3 refills | Status: AC

## 2024-12-07 MED ORDER — GABAPENTIN 300 MG PO CAPS: 300.0000 mg | ORAL_CAPSULE | Freq: Three times a day (TID) | ORAL | 3 refills | Status: AC

## 2024-12-07 MED ORDER — DIAZEPAM 5 MG PO TABS: 5.0000 mg | ORAL_TABLET | Freq: Three times a day (TID) | ORAL | 0 refills | Status: AC | PRN

## 2024-12-07 NOTE — Assessment & Plan Note (Signed)
 Not doing well at all. Will get her into psych and will see about getting social work involved. Will restart her valium . Will consider adding atypical based on her response. Will check in by mychart on her mood in 1-2 weeks. Call with any concerns.
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
## 2024-12-09 ENCOUNTER — Telehealth: Payer: Self-pay

## 2024-12-09 NOTE — Progress Notes (Signed)
 Complex Care Management Note  Care Guide Note 12/09/2024 Name: Jessica Peters MRN: [id number] DOB: February 01, 1981  Jessica Peters is a 44 y.o. year old female who sees Vicci Duwaine SQUIBB, DO for primary care. I reached out to Jessica Peters by phone today to offer complex care management services.  Jessica Peters was given information about Complex Care Management services today including:   The Complex Care Management services include support from the care team which includes your Nurse Care Manager, Clinical Social Worker, or Pharmacist.  The Complex Care Management team is here to help remove barriers to the health concerns and goals most important to you. Complex Care Management services are voluntary, and the patient may decline or stop services at any time by request to their care team member.   Complex Care Management Consent Status: Patient agreed to services and verbal consent obtained.   Follow up plan:  Telephone appointment with complex care management team member scheduled for:  12/13/2024  Encounter Outcome:  Patient Scheduled  Jeoffrey Buffalo , RMA     [place]  [facility], [facility] Guide  Direct Dial: [phone]  Website: [email or web address]

## 2024-12-10 ENCOUNTER — Ambulatory Visit: Admitting: Family Medicine

## 2024-12-13 ENCOUNTER — Other Ambulatory Visit: Payer: Self-pay

## 2024-12-13 NOTE — Patient Instructions (Signed)
 Lauraine MARLA Lundborg - I am sorry I was unable to reach you today for our scheduled appointment. I work with Vicci Duwaine SQUIBB, DO and am calling to support your healthcare needs. Please contact me at [phone] at your earliest convenience. I look forward to speaking with you soon.   Thank you,  Olam Ally, MSW, LCSW [place]  [facility], [facility] Licensed Clinical Social Worker Direct Dial: [phone]

## 2024-12-13 NOTE — Patient Outreach (Signed)
 LCSW called patient at scheduled time and was unable to reach patient. LCSW left voicemail with contact information as well as notifying patient the scheduling guide would attempt to reschedule.  Olam Ally, MSW, LCSW [place]  [facility], [facility] Licensed Clinical Social Worker Direct Dial: [phone]

## 2024-12-21 ENCOUNTER — Telehealth: Payer: Self-pay

## 2024-12-21 NOTE — Progress Notes (Unsigned)
 Complex Care Management Care Guide Note  12/21/2024 Name: Jessica Peters MRN: [id number] DOB: 1981/07/04  Jessica Peters is a 44 y.o. year old female who is a primary care patient of Vicci Duwaine SQUIBB, DO and is actively engaged with the care management team. I reached out to Jessica Peters by phone today to assist with re-scheduling  with the Licensed Clinical Social Worker.  Follow up plan: Unsuccessful telephone outreach attempt made. A HIPAA compliant phone message was left for the patient providing contact information and requesting a return call.  Jeoffrey Buffalo , RMA     [facility]  [facility], [place] Guide  Direct Dial: [phone]  Website: [email or web address]

## 2024-12-23 NOTE — Progress Notes (Signed)
 Complex Care Management Care Guide Note  12/23/2024 Name: Jessica Peters MRN: [id number] DOB: [birth year]  Jessica Peters is a 44 y.o. year old female who is a primary care patient of Vicci Duwaine SQUIBB, DO and is actively engaged with the care management team. I reached out to Jessica Peters by phone today to assist with re-scheduling  with the Licensed Clinical Social Worker.  Follow up plan: Unsuccessful telephone outreach attempt made. A HIPAA compliant phone message was left for the patient providing contact information and requesting a return call.  Jessica Peters , RMA     [facility]  [facility], [facility] Guide  Direct Dial: [phone]  Website: [email or web address]

## 2024-12-27 ENCOUNTER — Ambulatory Visit: Admitting: Urology

## 2024-12-29 ENCOUNTER — Encounter: Payer: Self-pay | Admitting: Urology

## 2025-01-03 ENCOUNTER — Ambulatory Visit: Admitting: Family Medicine

## 2025-01-18 ENCOUNTER — Ambulatory Visit: Payer: Self-pay | Admitting: Family Medicine

## 2025-03-14 ENCOUNTER — Ambulatory Visit: Admitting: Urology

## 2025-04-07 ENCOUNTER — Ambulatory Visit: Payer: Self-pay | Admitting: Neurology
